# Patient Record
Sex: Female | Born: 1945 | Race: White | Hispanic: No | Marital: Married | Smoking: Former smoker
Health system: Southern US, Community
[De-identification: ages and names within clinical notes are randomized; demographics above are authoritative.]

## PROBLEM LIST (undated history)

## (undated) DIAGNOSIS — K5010 Crohn's disease of large intestine without complications: Secondary | ICD-10-CM

## (undated) DIAGNOSIS — K449 Diaphragmatic hernia without obstruction or gangrene: Secondary | ICD-10-CM

## (undated) DIAGNOSIS — K5792 Diverticulitis of intestine, part unspecified, without perforation or abscess without bleeding: Secondary | ICD-10-CM

## (undated) DIAGNOSIS — J449 Chronic obstructive pulmonary disease, unspecified: Secondary | ICD-10-CM

## (undated) DIAGNOSIS — E119 Type 2 diabetes mellitus without complications: Secondary | ICD-10-CM

## (undated) DIAGNOSIS — I10 Essential (primary) hypertension: Secondary | ICD-10-CM

## (undated) DIAGNOSIS — K589 Irritable bowel syndrome without diarrhea: Secondary | ICD-10-CM

## (undated) DIAGNOSIS — K5790 Diverticulosis of intestine, part unspecified, without perforation or abscess without bleeding: Secondary | ICD-10-CM

## (undated) DIAGNOSIS — F419 Anxiety disorder, unspecified: Secondary | ICD-10-CM

## (undated) DIAGNOSIS — D126 Benign neoplasm of colon, unspecified: Principal | ICD-10-CM

## (undated) DIAGNOSIS — K219 Gastro-esophageal reflux disease without esophagitis: Secondary | ICD-10-CM

## (undated) DIAGNOSIS — J4489 Other specified chronic obstructive pulmonary disease: Secondary | ICD-10-CM

## (undated) DIAGNOSIS — J189 Pneumonia, unspecified organism: Secondary | ICD-10-CM

## (undated) HISTORY — DX: Type 2 diabetes mellitus without complications: E11.9

## (undated) HISTORY — DX: Gastro-esophageal reflux disease without esophagitis: K21.9

## (undated) HISTORY — DX: Diverticulitis of intestine, part unspecified, without perforation or abscess without bleeding: K57.92

## (undated) HISTORY — DX: Diverticulosis of intestine, part unspecified, without perforation or abscess without bleeding: K57.90

## (undated) HISTORY — DX: Crohn's disease of large intestine without complications: K50.10

## (undated) HISTORY — DX: Pneumonia, unspecified organism: J18.9

## (undated) HISTORY — DX: Irritable bowel syndrome, unspecified: K58.9

## (undated) HISTORY — PX: OTHER SURGICAL HISTORY: SHX169

## (undated) HISTORY — DX: Chronic obstructive pulmonary disease, unspecified: J44.9

## (undated) HISTORY — DX: Anxiety disorder, unspecified: F41.9

## (undated) HISTORY — DX: Diaphragmatic hernia without obstruction or gangrene: K44.9

## (undated) HISTORY — PX: CHOLECYSTECTOMY: SHX55

## (undated) HISTORY — DX: Benign neoplasm of colon, unspecified: D12.6

## (undated) HISTORY — DX: Essential (primary) hypertension: I10

## (undated) HISTORY — DX: Other specified chronic obstructive pulmonary disease: J44.89

---

## 1978-02-04 HISTORY — PX: ABDOMINAL HYSTERECTOMY: SHX81

## 1996-02-05 DIAGNOSIS — D126 Benign neoplasm of colon, unspecified: Secondary | ICD-10-CM

## 1996-02-05 HISTORY — DX: Benign neoplasm of colon, unspecified: D12.6

## 1996-07-05 DIAGNOSIS — K5010 Crohn's disease of large intestine without complications: Secondary | ICD-10-CM

## 1996-07-05 DIAGNOSIS — D126 Benign neoplasm of colon, unspecified: Secondary | ICD-10-CM

## 1996-07-05 HISTORY — DX: Benign neoplasm of colon, unspecified: D12.6

## 1996-07-05 HISTORY — DX: Crohn's disease of large intestine without complications: K50.10

## 1997-05-05 ENCOUNTER — Ambulatory Visit (HOSPITAL_COMMUNITY): Admission: RE | Admit: 1997-05-05 | Discharge: 1997-05-05 | Payer: Self-pay | Admitting: *Deleted

## 1997-10-05 ENCOUNTER — Ambulatory Visit (HOSPITAL_COMMUNITY): Admission: RE | Admit: 1997-10-05 | Discharge: 1997-10-05 | Payer: Self-pay | Admitting: Neurosurgery

## 1999-02-05 DIAGNOSIS — K5792 Diverticulitis of intestine, part unspecified, without perforation or abscess without bleeding: Secondary | ICD-10-CM

## 1999-02-05 HISTORY — DX: Diverticulitis of intestine, part unspecified, without perforation or abscess without bleeding: K57.92

## 1999-05-06 HISTORY — PX: COLONOSCOPY: SHX174

## 1999-05-31 ENCOUNTER — Encounter (INDEPENDENT_AMBULATORY_CARE_PROVIDER_SITE_OTHER): Payer: Self-pay | Admitting: Specialist

## 1999-05-31 ENCOUNTER — Other Ambulatory Visit: Admission: RE | Admit: 1999-05-31 | Discharge: 1999-05-31 | Payer: Self-pay | Admitting: Gastroenterology

## 1999-06-18 ENCOUNTER — Encounter: Payer: Self-pay | Admitting: General Surgery

## 1999-06-18 ENCOUNTER — Ambulatory Visit (HOSPITAL_COMMUNITY): Admission: RE | Admit: 1999-06-18 | Discharge: 1999-06-18 | Payer: Self-pay | Admitting: General Surgery

## 1999-07-06 HISTORY — PX: COLECTOMY: SHX59

## 1999-07-11 ENCOUNTER — Encounter: Payer: Self-pay | Admitting: General Surgery

## 1999-07-16 ENCOUNTER — Encounter (INDEPENDENT_AMBULATORY_CARE_PROVIDER_SITE_OTHER): Payer: Self-pay | Admitting: Specialist

## 1999-07-16 ENCOUNTER — Inpatient Hospital Stay (HOSPITAL_COMMUNITY): Admission: RE | Admit: 1999-07-16 | Discharge: 1999-07-22 | Payer: Self-pay | Admitting: General Surgery

## 1999-09-05 HISTORY — PX: ESOPHAGOGASTRODUODENOSCOPY: SHX1529

## 1999-09-18 ENCOUNTER — Ambulatory Visit (HOSPITAL_COMMUNITY): Admission: RE | Admit: 1999-09-18 | Discharge: 1999-09-18 | Payer: Self-pay | Admitting: Gastroenterology

## 1999-09-18 ENCOUNTER — Encounter: Payer: Self-pay | Admitting: Gastroenterology

## 1999-09-20 ENCOUNTER — Other Ambulatory Visit: Admission: RE | Admit: 1999-09-20 | Discharge: 1999-09-20 | Payer: Self-pay | Admitting: Gastroenterology

## 1999-11-12 ENCOUNTER — Ambulatory Visit (HOSPITAL_COMMUNITY): Admission: RE | Admit: 1999-11-12 | Discharge: 1999-11-12 | Payer: Self-pay | Admitting: General Surgery

## 2000-03-04 ENCOUNTER — Other Ambulatory Visit: Admission: RE | Admit: 2000-03-04 | Discharge: 2000-03-04 | Payer: Self-pay | Admitting: Obstetrics and Gynecology

## 2000-11-06 ENCOUNTER — Ambulatory Visit (HOSPITAL_COMMUNITY): Admission: RE | Admit: 2000-11-06 | Discharge: 2000-11-06 | Payer: Self-pay | Admitting: Family Medicine

## 2000-11-06 ENCOUNTER — Encounter: Payer: Self-pay | Admitting: Family Medicine

## 2001-07-16 ENCOUNTER — Encounter: Payer: Self-pay | Admitting: Family Medicine

## 2001-07-16 ENCOUNTER — Ambulatory Visit (HOSPITAL_COMMUNITY): Admission: RE | Admit: 2001-07-16 | Discharge: 2001-07-16 | Payer: Self-pay | Admitting: Family Medicine

## 2001-08-26 ENCOUNTER — Ambulatory Visit (HOSPITAL_COMMUNITY): Admission: RE | Admit: 2001-08-26 | Discharge: 2001-08-26 | Payer: Self-pay | Admitting: Family Medicine

## 2001-08-26 ENCOUNTER — Encounter: Payer: Self-pay | Admitting: Family Medicine

## 2001-09-03 ENCOUNTER — Ambulatory Visit (HOSPITAL_COMMUNITY): Admission: RE | Admit: 2001-09-03 | Discharge: 2001-09-03 | Payer: Self-pay | Admitting: Family Medicine

## 2001-09-03 ENCOUNTER — Encounter: Payer: Self-pay | Admitting: Family Medicine

## 2002-03-01 ENCOUNTER — Encounter: Payer: Self-pay | Admitting: Emergency Medicine

## 2002-03-01 ENCOUNTER — Emergency Department (HOSPITAL_COMMUNITY): Admission: EM | Admit: 2002-03-01 | Discharge: 2002-03-01 | Payer: Self-pay | Admitting: Emergency Medicine

## 2003-04-01 ENCOUNTER — Ambulatory Visit (HOSPITAL_COMMUNITY): Admission: RE | Admit: 2003-04-01 | Discharge: 2003-04-01 | Payer: Self-pay | Admitting: Internal Medicine

## 2003-05-03 ENCOUNTER — Ambulatory Visit (HOSPITAL_COMMUNITY): Admission: RE | Admit: 2003-05-03 | Discharge: 2003-05-03 | Payer: Self-pay | Admitting: Internal Medicine

## 2004-11-04 ENCOUNTER — Inpatient Hospital Stay (HOSPITAL_COMMUNITY): Admission: EM | Admit: 2004-11-04 | Discharge: 2004-11-17 | Payer: Self-pay | Admitting: Emergency Medicine

## 2004-11-05 ENCOUNTER — Ambulatory Visit: Payer: Self-pay | Admitting: Internal Medicine

## 2004-11-06 HISTORY — PX: ENDOSCOPIC RETROGRADE CHOLANGIOPANCREATOGRAPHY (ERCP) WITH PROPOFOL: SHX5810

## 2004-11-18 ENCOUNTER — Encounter (HOSPITAL_COMMUNITY): Admission: RE | Admit: 2004-11-18 | Discharge: 2004-12-18 | Payer: Self-pay | Admitting: Internal Medicine

## 2004-11-18 ENCOUNTER — Encounter: Admission: RE | Admit: 2004-11-18 | Discharge: 2004-11-18 | Payer: Self-pay | Admitting: Internal Medicine

## 2004-11-19 ENCOUNTER — Ambulatory Visit (HOSPITAL_COMMUNITY): Payer: Self-pay | Admitting: Internal Medicine

## 2004-11-22 ENCOUNTER — Ambulatory Visit (HOSPITAL_COMMUNITY): Admission: RE | Admit: 2004-11-22 | Discharge: 2004-11-22 | Payer: Self-pay | Admitting: Unknown Physician Specialty

## 2004-11-30 ENCOUNTER — Encounter (INDEPENDENT_AMBULATORY_CARE_PROVIDER_SITE_OTHER): Payer: Self-pay | Admitting: General Surgery

## 2004-12-01 ENCOUNTER — Inpatient Hospital Stay (HOSPITAL_COMMUNITY): Admission: RE | Admit: 2004-12-01 | Discharge: 2004-12-03 | Payer: Self-pay | Admitting: General Surgery

## 2005-06-05 ENCOUNTER — Encounter: Payer: Self-pay | Admitting: Neurosurgery

## 2007-09-21 ENCOUNTER — Emergency Department (HOSPITAL_COMMUNITY): Admission: EM | Admit: 2007-09-21 | Discharge: 2007-09-21 | Payer: Self-pay | Admitting: Emergency Medicine

## 2007-11-03 ENCOUNTER — Ambulatory Visit (HOSPITAL_COMMUNITY): Admission: RE | Admit: 2007-11-03 | Discharge: 2007-11-03 | Payer: Self-pay | Admitting: Neurology

## 2008-02-17 ENCOUNTER — Ambulatory Visit: Payer: Self-pay | Admitting: Cardiology

## 2008-03-18 ENCOUNTER — Emergency Department (HOSPITAL_COMMUNITY): Admission: EM | Admit: 2008-03-18 | Discharge: 2008-03-18 | Payer: Self-pay | Admitting: Emergency Medicine

## 2010-05-22 LAB — BASIC METABOLIC PANEL
BUN: 19 mg/dL (ref 6–23)
CO2: 27 mEq/L (ref 19–32)
Calcium: 8 mg/dL — ABNORMAL LOW (ref 8.4–10.5)
Chloride: 105 mEq/L (ref 96–112)
Creatinine, Ser: 1.52 mg/dL — ABNORMAL HIGH (ref 0.4–1.2)
GFR calc Af Amer: 42 mL/min — ABNORMAL LOW (ref >60)
GFR calc non Af Amer: 35 mL/min — ABNORMAL LOW (ref >60)
Glucose, Bld: 162 mg/dL — ABNORMAL HIGH (ref 70–99)
Potassium: 4.1 mEq/L (ref 3.5–5.1)
Sodium: 137 mEq/L (ref 135–145)

## 2010-05-22 LAB — CBC
HCT: 46.1 % — ABNORMAL HIGH (ref 36.0–46.0)
Hemoglobin: 15.6 g/dL — ABNORMAL HIGH (ref 12.0–15.0)
MCHC: 33.8 g/dL (ref 30.0–36.0)
Platelets: 402 10*3/uL — ABNORMAL HIGH (ref 150–400)
RDW: 14.1 % (ref 11.5–15.5)

## 2010-05-22 LAB — RAPID URINE DRUG SCREEN, HOSP PERFORMED
Amphetamines: NOT DETECTED
Barbiturates: NOT DETECTED
Benzodiazepines: POSITIVE — AB
Cocaine: NOT DETECTED
Opiates: NOT DETECTED
Tetrahydrocannabinol: NOT DETECTED

## 2010-05-22 LAB — BLOOD GAS, ARTERIAL
Acid-base deficit: 0.5 mmol/L (ref 0.0–2.0)
O2 Content: 3 L/min
O2 Saturation: 93 %
Patient temperature: 37
TCO2: 23.4 mmol/L (ref 0–100)

## 2010-05-22 LAB — ETHANOL: Alcohol, Ethyl (B): <5 mg/dL (ref 0–10)

## 2010-05-22 LAB — THEOPHYLLINE LEVEL: Theophylline Lvl: 7.1 ug/mL — ABNORMAL LOW (ref 10.0–20.0)

## 2010-05-22 LAB — DIFFERENTIAL
Basophils Absolute: 0 10*3/uL (ref 0.0–0.1)
Basophils Relative: 0 % (ref 0–1)
Eosinophils Relative: 1 % (ref 0–5)
Monocytes Absolute: 0.4 10*3/uL (ref 0.1–1.0)

## 2010-06-22 NOTE — Consult Note (Signed)
[name], [name]               ACCOUNT NO.:  [id number]   MEDICAL RECORD NO.:  [id number]          PATIENT TYPE:  INP   LOCATION:  A341                          FACILITY:  [facility]   PHYSICIAN:  Edward L. Juanetta Gosling, M.D.DATE OF BIRTH:  10/10/1945   DATE OF CONSULTATION:  11/08/2004  DATE OF DISCHARGE:                                   CONSULTATION   REASON FOR CONSULTATION:  Abnormal chest x-ray.   HISTORY OF PRESENT ILLNESS:  [name] is a 65 year old who came to the  emergency room on the day of admission with severe abdominal pain.  She said  that it is sharp, started in her epigastric, went into the right upper  quadrant and into her back.  She ended up having an ERCP with sphincterotomy  and stone extraction on November 06, 2004.  She underwent a CT chest because  of shortness of breath and was found to have changes that are suggestive  that she has had bronchiectasis, probably inflammatory in nature.  She says  she is very short of breath.  She normally is better if she is able to take  nebulizer treatments, but this time does not seem to be helping a great  deal.  Her CT scan did not show acute pulmonary embolus.  Did show extensive  scarring with bronchiectasis, volume loss in both upper lobes and right  middle lobes with relative sparing of the lower lobe.  She had mediastinal  hilar adenopathy also and bilateral pleural effusions, right more than left.  She says that she has had trouble with her lungs for some time.  She has had  a history of pneumonia in the past.  She denies significant shortness of  breath, cough, fever, chills, sputum production or weight loss prior to  admission.  She did loose about 14 pounds, but did that on purpose.  She was  sent by Dr. Eliberto Ivory for ultrasound and she also had a chest x-ray done in the  evening in the last several months.   PAST MEDICAL HISTORY:  1.  COPD.  2.  Hypertension.  3.  Anxiety.  4.  Depression.  5.  Peripheral vascular  disease.   PAST SURGICAL HISTORY:  1.  Colon resection for diverticulitis.  2.  Hemorrhoidectomy.  3.  C-section.  4.  Fusion surgery on her back for chronic back pain.   MEDICATIONS ON ADMISSION:  1.  Prozac 60 mg daily.  2.  Zyprexa 10 mg daily.  3.  Valium 5 mg b.i.d.  4.  Percocet 1-2 q.6h. p.r.n.  5.  Fluid pill, but she is not sure what it is.  6.  Advair 250/50 one puff b.i.d.  7.  Albuterol inhaler q.i.d.   FAMILY HISTORY:  Father died of lung cancer in his 23's.  He did have COPD.  Mother had COPD.  Sister had tuberculosis at age 25, and while she was  living at home, the patient was 4.  She is not certain if she has ever had a  TB skin test in the past.   SOCIAL HISTORY:  She  is unemployed.  She smokes about a package of  cigarettes daily.  Her total pack a year smoking history is approximately  100.   PHYSICAL EXAMINATION:  GENERAL APPEARANCE:  She looks dyspneic.  VITAL SIGNS:  Temperature 98.4, pulse 94, respirations 22, blood pressure  140/82, O2 saturation 98% on 50%.  HEENT:  Pupils are reactive.  Nose and throat are clear.  NECK:  Supple without masses.  CHEST:  Wheezes bilaterally.  HEART:  Irregular without gallops.  ABDOMEN:  Soft.  EXTREMITIES:  No edema.  CNS:  Grossly intact.   LABORATORY DATA:  Blood gas on 2 liters of O2 showed PO2 48, PCO2 30.9, pH  7.46.  Comprehensive metabolic panel:  SGPT 44, alk-phos 228, bilirubin 2.5,  albumin 2.  CBC:  White count 10,800, hemoglobin 10.   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  I think we need to go ahead  and obtain sputum.  Go ahead and put a skin test on her, treat her for her  COPD.  She had a chest x-ray done over at [facility] about three weeks ago, so  I think we should go ahead and ask for that film to be sent over here, and I  will need  to get the report.  I think that her pleural effusion is probably  related to her intraabdominal process.  We can go ahead and obtain some  pleural fluid for culture as well, and I think that is reasonable.  I would  probably go ahead and start TB treatment, although, I do not think this  necessarily represents active TB, but she is fairly sick, and I do not want  to take a chance with not treating her.      Edward L. Juanetta Gosling, M.D.  Electronically Signed     ELH/MEDQ  D:  11/08/2004  T:  11/09/2004  Job:  [id number]

## 2010-06-22 NOTE — Consult Note (Signed)
[name], [name]               ACCOUNT NO.:  [id number]   MEDICAL RECORD NO.:  [id number]          PATIENT TYPE:  INP   LOCATION:  A212                          FACILITY:  [facility]   PHYSICIAN:  R. Roetta Sessions, M.D. DATE OF BIRTH:  03/20/1945   DATE OF CONSULTATION:  11/05/2004  DATE OF DISCHARGE:                                   CONSULTATION   REFERRING PHYSICIAN:  Margaretmary Dys, M.D.   REASON FOR CONSULTATION:  Suspect cholecystitis and acute gallstone  pancreatitis.   HISTORY OF PRESENT ILLNESS:  Ms. Marcelli is a 65 year old, Caucasian female  who reports that yesterday afternoon she began to have severe sudden  unrelenting and unbearable epigastric and right upper quadrant pain that  radiated around to her flank to her back.  After about 2 hours of severe  pain, she presents to the emergency room.  At that time, her pain was 10/10.  She had nausea, but did not have emesis until after consuming contrast for  CT scan.  She complains of cold sweats and she believes she had fever and  chills while at home.  Temperature today is normal.  She was found to have  an elevated amylase at 1117, elevated lipase at 920.  Today, this is up from  519 and 1000 yesterday.  She has elevated total bilirubin at 6.1, direct  bilirubin 4.2.  She has an elevated Alk phos of 320, elevated AST of 240,  elevated ALT of 167.  She denies any history of jaundice.  She does report  some darkening and orange discoloration to her urine.  She has been admitted  for pain control and she is being given Dilaudid with good relief.  She has  also been started on Unasyn 3 g IV q.6h.  She has had no further emesis.   PAST MEDICAL HISTORY:  1.  IBS with alternating constipation and diarrhea.  She is followed by      [facility] in [place].  2.  History of diverticulitis, status post colonic resection for      diverticular disease in October 2001.  3.  Status post hemorrhoidectomy.  4.  Status post  appendectomy.  5.  History of chronic GERD and hiatal hernia.  She has had a recent EGD,      although she does not recall exactly when by [facility].  6.  Chronic back pain, status post fusion.  7.  COPD.  8.  Hypertension.  9.  Anxiety.  10. Depression.  11. Peripheral vascular disease.  12. C-section.  13. Partial hysterectomy.  14. Appendectomy.   MEDICATIONS PRIOR TO ADMISSION:  1.  Prozac 60 mg daily.  2.  Zyprexa daily.  3.  Valium 5 mg b.i.d.  4.  Percocet one to two q.6h. p.r.n. chronic back pain.  5.  Unknown fluid pill.  6.  Advair 250/50 mg b.i.d.  7.  Albuterol nebulizers p.r.n.  8.  Nexium 40 mg daily.  9.  Zantac p.r.n.  10. Maalox p.r.n.  11. Carafate p.r.n.   ALLERGIES:  CODEINE and DEMEROL.   FAMILY HISTORY:  Ms. Killough  mother is deceased at age 59 secondary to  abdominal aortic aneurysm.  Father deceased at age 42 secondary to lung  carcinoma and COPD.  She has multiple siblings with history significant for  coronary artery disease.   SOCIAL HISTORY:  Ms. Gathright has been married x4 years.  She is currently  unemployed.  She has smoked x45 years, currently one pack a day, previously  two packs a day.  She denies any alcohol or drug use.  She has two grown,  healthy daughters.   REVIEW OF SYSTEMS:  CONSTITUTIONAL:  She denies any anorexia.  She is  complaining of fatigue.  See HPI.  CARDIOVASCULAR:  Denies any chest pain or  palpitations or dyspnea.  RESPIRATORY:  She does have chronic cough which is  nonproductive.  GASTROINTESTINAL:  See HPI.  She has chronic daily or every  other day heartburn and indigestion.  She has had history of barium pill  esophagram recently for dysphagia.  She takes Nexium, Zantac, Maalox and  Carafate on a regular p.r.n. basis.  She is followed by [facility] in  [place].   PHYSICAL EXAMINATION:  VITAL SIGNS:  Temperature 97.9, pulse 83,  respirations 18, blood pressure 90/53.  She is on 2 L O2 via nasal cannula.  Height  60 inches, weight 128.6 pounds.  GENERAL:  Ms. Slaven is a 65 year old, Caucasian female who is alert,  pleasant and cooperative in no acute distress.  HEENT:  Sclerae with mild icterus.  Conjunctivae pale.  Oropharynx pink and  moist without any lesions.  Her right eye deviates laterally.  NECK:  Supple without any masses or thyromegaly.  HEART:  Regular rate and rhythm without normal S1, S2 without murmurs, rubs  or gallops.  LUNGS:  Emphysematous changes throughout.  In no acute distress.  ABDOMEN:  Positive bowel sounds x4.  No bruits auscultated.  She has  positive Murphy's sign.  She has severe right upper quadrant tenderness on  superficial palpation.  She does have rebound tenderness and guarding as  well.  Unable to palpate mass or hepatosplenomegaly.  EXTREMITIES:  Without edema or clubbing bilaterally.  SKIN:  Pink, warm and dry without any rash or jaundice.   LABORATORY DATA AND X-RAY FINDINGS:  WBC 15.3, hemoglobin 12.7, hematocrit  36.1, platelets 443.  PT 59, INR 1.2, calcium 8.  Sodium 129, potassium 4.4,  chloride 97, CO2 26, BUN 9, creatinine 1, glucose 107.  Total protein 5.1,  albumin 2.9.   IMPRESSION:  Ms. Mittal is a 65 year old, Caucasian female with acute onset  of right upper quadrant and epigastric pain rated 10/10 with sudden onset  yesterday afternoon.  The pain lasted about 2 hours prior to emergency room  visit with subsequent admission.  She also had nausea without emesis until  she was given computed tomography contrast to drink.  In the emergency room,  she has had emesis x1.  She was found to have an elevated amylase at 519 and  lipase at 1000 when she was admitted.  This is now up to 1117 and 920.  She  has hyperbilirubinemia, most direct at 6.1/4.2.  She has an elevated  alkaline phosphatase at 320, AST 240 and ALT 167.  Her computed tomography of abdomen and pelvis was reviewed with the radiologist today.  She does  show gallbladder wall thickening  and enhanced common bile duct with likely  cholecystitis, possible cholangitis.  She has possible mild pancreatitis and  poor visualization of common bile duct, but  radiologist reports to me a  measurement of approximately 9 mm duct.  There is no evidence of  cholelithiasis on computed tomography per the radiologist report.   RECOMMENDATIONS:  1.  We have a 65 year old Caucasian female with findings consistent with      partial biliary obstruction of questionable etiology with possible      biliary pancreatitis, occult gallstone pancreatitis or even occult      pancreatic mass.  We need to rule out cholelithiasis or      choledocholithiasis.  2.  Stat gallbladder ultrasound.  3.  Agree with antibiotic coverage.  4.  Agree with Dilaudid p.r.n. pain.  5.  Agree with surgical consult.  6.  Further recommendations to follow.  This case has been discussed with      Dr. Jena Gauss and our plan of care outlined above.   We would like to thank Dr. Sherle Poe for allowing [place] to participate in the  care of Ms. Sharma Covert.      Nicholas Lose, N.P.      Jonathon Bellows, M.D.  Electronically Signed    KC/MEDQ  D:  11/05/2004  T:  11/05/2004  Job:  [id number]

## 2010-06-22 NOTE — Group Therapy Note (Signed)
[name], [name]               ACCOUNT NO.:  [id number]   MEDICAL RECORD NO.:  [id number]          PATIENT TYPE:  INP   LOCATION:  A302                          FACILITY:  [facility]   PHYSICIAN:  Edward L. Juanetta Gosling, M.D.DATE OF BIRTH:  May 08, 1945   DATE OF PROCEDURE:  DATE OF DISCHARGE:                                   PROGRESS NOTE   SUBJECTIVE:  [name] seems better. She is off of isolation now. She says  she is less short of breath.   Her exam shows temp is 98.7, pulse 89, respirations 20, blood pressure  120/65. O2 sat is 97%. Chest is clearer.   ASSESSMENT:  She seems to be improving. She is going to have problems I  suspect with bronchiectasis from now on, but she is better, and we will plan  to continue treatments and follow. I am going to follow her more  peripherally now but will be of course be available as needed to follow her  more actively.      Edward L. Juanetta Gosling, M.D.  Electronically Signed     ELH/MEDQ  D:  11/15/2004  T:  11/15/2004  Job:  [id number]

## 2010-06-22 NOTE — Group Therapy Note (Signed)
[name], [name]               ACCOUNT NO.:  [id number]   MEDICAL RECORD NO.:  [id number]          PATIENT TYPE:  INP   LOCATION:  A341                          FACILITY:  [facility]   PHYSICIAN:  Edward L. Juanetta Gosling, M.D.DATE OF BIRTH:  30-Nov-1945   DATE OF PROCEDURE:  11/09/2004  DATE OF DISCHARGE:                                   PROGRESS NOTE   PROBLEM:  1.  Abnormal chest x-ray.  2.  Cholecystitis.   SUBJECTIVE:  [name] is about the same. She is a little less short of  breath than she was. We are going to see if we can get her chest x-ray from  [facility] to compare.   OBJECTIVE:  VITAL SIGNS:  Her exam shows temperature 99.6, pulse 107,  respirations 22, blood pressure 158/68, O2 saturation 95% on 50%.   ASSESSMENT:  The concern is that we cannot rule out her having an active  process. I think the areas are mostly scarring from a previous infection  versus inflammatory process, but apparently she had a chest x-ray done about  six weeks ago that did not show much of anything. We are going to see if we  can obtain that film, and that will help make a decision. I did start her on  TB medications yesterday. We are going to try to get TB smears done. She had  a PPD placed yesterday as well.      Edward L. Juanetta Gosling, M.D.  Electronically Signed     ELH/MEDQ  D:  11/09/2004  T:  11/09/2004  Job:  [id number]

## 2010-06-22 NOTE — Group Therapy Note (Signed)
NAMEPOLLIE, POMA               ACCOUNT NO.:  [id number]   MEDICAL RECORD NO.:  [id number]          PATIENT TYPE:  INP   LOCATION:  A341                          FACILITY:  [facility]   PHYSICIAN:  Edward L. Juanetta Gosling, M.D.DATE OF BIRTH:  [birth year]   DATE OF PROCEDURE:  11/13/2004  DATE OF DISCHARGE:                                   PROGRESS NOTE   SUBJECTIVE:  Ms. Thoreson seems to be doing better.  She did have multiple  negative AFB smears, so I have discontinued isolation yesterday.  It is my  opinion then that she has the abnormalities on her chest x-ray on the basis  of a severe pneumonia.  I am not sure if that was started presently, but I  suspect it did.  She has what looks like bronchiectatic changes.  I suspect  that she had another pneumonia intercurrently that caused her to have some  lung destruction and bronchiectatic changes.  I would plan to continue her  antibiotics as before.   OBJECTIVE:  VITAL SIGNS:  Her exam today shows temperature is 98.1, pulse  91, respirations 24, blood pressure 122/65.   ASSESSMENT/PLAN:  When she has improved as far as her lung situation is  concerned,  I think she could go ahead and have her cholecystectomy.      Edward L. Juanetta Gosling, M.D.  Electronically Signed     ELH/MEDQ  D:  11/13/2004  T:  11/13/2004  Job:  [id number]

## 2010-06-22 NOTE — H&P (Signed)
[name], [name]               ACCOUNT NO.:  [id number]   MEDICAL RECORD NO.:  [id number]          PATIENT TYPE:  INP   LOCATION:  A212                          FACILITY:  [facility]   PHYSICIAN:  Margaretmary Dys, M.D.DATE OF BIRTH:  1945-05-17   DATE OF ADMISSION:  11/04/2004  DATE OF DISCHARGE:  LH                                HISTORY & PHYSICAL   Audio too short to transcribe (less than 5 seconds)      Margaretmary Dys, M.D.     AM/MEDQ  D:  11/04/2004  T:  11/04/2004  Job:  [id number]

## 2010-06-22 NOTE — Op Note (Signed)
NAMEKELIAH, HARNED               ACCOUNT NO.:  [id number]   MEDICAL RECORD NO.:  [id number]          PATIENT TYPE:  OBV   LOCATION:  A340                          FACILITY:  [facility]   PHYSICIAN:  Dirk Dress. [name], M.D.   DATE OF BIRTH:  1946/01/26   DATE OF PROCEDURE:  DATE OF DISCHARGE:                                 OPERATIVE REPORT   PREOPERATIVE DIAGNOSES:  1.  Cholelithiasis.  2.  Cholecystitis.   POSTOPERATIVE DIAGNOSES:  1.  Cholelithiasis.  2.  Cholecystitis.   PROCEDURE:  Laparoscopic cholecystectomy.   SURGEON:  Dirk Dress. [name], M.D.   DESCRIPTION OF PROCEDURE:  Under general anesthesia, the patient's abdomen  was prepped and draped as a sterile field.  A supraumbilical midline  incision was made and Veress needle was inserted uneventfully.  The abdomen  was insufflated with 3 L of CO2.  Using a Visiport guide, the 10 mm port was  placed.  The laparoscope was placed.  The gallbladder was visualized.  She  had chronic, inflamed, thickened gallbladder.  Under videoscopic guidance, a  10 mm and two 5 mm ports were placed in the right subcostal region.  The  gallbladder was grasped and positioned.  The cystic artery was dissected and  clipped with three clips and divided.  The cystic duct was dissected,  clipped with five clips, and divided.  The gallbladder was then separated  from the intrahepatic bed with electrocautery.  It was placed in an  EndoCatch device and retrieved.  Hemostasis was achieved.  Irrigation was  carried out and the fluid was clear.  CO2 was allowed to escape from the  abdomen and the ports were removed.  The incisions were closed using 0  Vicryl along the fascia and the umbilicus and staples for the skin.  The  patient tolerated the procedure well.  She was awakened from anesthesia  uneventfully and transferred to her bed, taken to post-anesthetic care unit  for monitoring.      Dirk Dress. [name], M.D.  Electronically Signed     LCS/MEDQ  D:   11/30/2004  T:  11/30/2004  Job:  [id number]   cc:   Dr. Weyman Pedro, [facility]

## 2010-06-22 NOTE — Discharge Summary (Signed)
[facility]  Patient:    [name], [name]                      MRN: [id number] Adm. Date:  [id number] Disc. Date: [id number] Attending:  Carson Myrtle CC:         Ulyess Mort, M.D. [facility]                           Discharge Summary  FINAL DIAGNOSIS:  Chronic diverticulitis.  HISTORY AND HOSPITAL COURSE:  The patient was seen, evaluated as an outpatient upon referral from Dr. Victorino Dike.  Workup had been accomplished, and she was prepared for sigmoid colectomy.  She was prepared at home and admitted to the hospital on July 16, 1999, underwent a sigmoid colectomy at that time.  She had a satisfactory postoperative recovery, requiring manipulation of pain medication.  That was done.  Anxiety, depression, chronic was treated.  Her GI function made a smooth and rapid return.  She tolerated her diet on the third and fourth day, and was ready for discharge on July 22, 1999.  She was sent home on a limited diet, limited activity, Vicodin for pain, to resume normal medications and will be followed in the office.  LABORATORY DATA ON ADMISSION:  Normal with a normal CBC, normal chemistries, negative urine.  Cardiogram showing left atrial enlargement and low voltage.  Chest x-ray normal. DD:  08/12/99 TD:  08/12/99 Job: [id number]

## 2010-06-22 NOTE — Op Note (Signed)
[facility]  Patient:    [name], [name]                      MRN: [id number] Proc. Date: 07/16/99 Adm. Date:  [id number] Attending:  Carson Myrtle                           Operative Report  PREOPERATIVE DIAGNOSIS:  Chronic diverticulosis.  POSTOPERATIVE DIAGNOSIS:  Chronic diverticulosis.  OPERATION PERFORMED:  Sigmoid colectomy.  SURGEON:  Timothy E. Earlene Plater, M.D.  ASSISTANT:  Zigmund Daniel, M.D.  ANESTHESIA:  CRNA supervised [place].  INDICATIONS FOR PROCEDURE:  Ms. Vogelgesang is a 65 year old Caucasian female with greater than 20 year history of irritable bowel syndrome, difficult bowel movements, alternating constipation, mostly diarrhea, frequent abdominal distention, cramps and diarrhea.  Colonoscopy has been done with removal of polyps.  Colonoscopy has been difficult with tortuosity and fixation of the sigmoid colon.  Barium enema demonstrated same.  Patient is known to have diverticulosis, has never been hospitalized with diverticulosis though one admission in 1998 was for so-called ischemic colitis.  She has been carefully counseled and is anxious to proceed with the sigmoid colectomy.  Prep was accomplished at home.  DESCRIPTION OF PROCEDURE:  The patient was brought to the operating room and placed supine.  General endotracheal anesthesia administered.  Preoperative antibiotics had been given.  A nasogastric tube, Foley catheter, PAS hose applied.  The patient was positioned in stirrups for potential EEA anastomosis and then placed in the normal supine position.  The abdomen was scrubbed prepped and draped in the usual fashion.  A lower midline incision used to the right of the umbilicus.  The abdomen was entered.  Some adhesions of omentum to the anterior abdominal wall were taken down with cautery.  Upper abdominal examination revealed a prominent pylorus, no pathology.  Liver and gallbladder were palpably normal.  No  abnormalities noted.  The small bowel was not adherent to the pelvis.  The omentum was and it was taken down sharply and freed.  The small bowel and omentum were packed into the upper abdomen.  The sigmiod colon was redundant, fixed to the left pelvic side wall, looped and duplicated upon itself and densely adherent to the ovary, to the peritoneum, to the bladder on the left side.  These adhesions were taken down carefully sharply so that the sigmoid colon was freed and even then, it was redoubled upon itself and adherent and fixed to itself at the apex of the loop of the sigmoid.  Sites were chosen for resection at the upper rectum and proximal sigmoid.  The patient had plenty of colon for an easy anastomosis handsewn one layer over the brim of the pelvis without tension.  The mesentery had been divided between clamps and carefully tied.  Both ureters were identified. Both left and right and kept lateral to the dissection and surgery.  Both ovaries were atrophic and were left in their respective positions.  The uterus was absent.  The bladder seemed to be intact.  There was a foreign body reaction of approximately 1 x 2 cm in the cuff of the vagina.  I did not attempt to remove that.  The redundant sigmoid was removed between clamps and then a hand-sewn one layer anastomosis was created.  This was air and water tight and again lay nicely over the pelvic brim without tension.  The mesentery was closed loosely on  the right side of the peritoneum.  Bleeding had been controlled.  The pelvis was copiously irrigated.  Counts were correct.  The other viscera were returned to their normal position and the abdomen was closed with a single layer of #1 PDS suture.  Second counts were correct.  Subcu copiously irrigated.  Skin closed with wide skin staples.  The patient tolerated the procedure well and was stable at completion of the procedure.  Estimated blood loss for this procedure was minimal.  She  was removed to recovery room in good condition. DD:  07/16/99 TD:  07/18/99 Job: [id number]

## 2010-06-22 NOTE — Group Therapy Note (Signed)
[name], [name]               ACCOUNT NO.:  [id number]   MEDICAL RECORD NO.:  [id number]          PATIENT TYPE:  INP   LOCATION:  A341                          FACILITY:  [facility]   PHYSICIAN:  Edward L. Juanetta Gosling, M.D.DATE OF BIRTH:  10/27/1945   DATE OF PROCEDURE:  11/10/2004  DATE OF DISCHARGE:                                   PROGRESS NOTE   Patient of Dr. Orvan Falconer.   SUBJECTIVE:  [name] is in essence unchanged. We are trying to get  sputums to make sure about whether she has TB or not. She does have an  abnormal CT which is new. Her exam shows temperature of 97.8, pulse 85,  respirations 19, blood pressure 142/74, O2 sats 97%. She continues to have  some pain. Her chest is clearer.   ASSESSMENT:  She had an abnormal CT scan and clearly is at risk for TB, so  she is going to need to continue treatment. She did have a thoracentesis and  thus far culture that is no organisms seen, no white cells seen. I do not  see any of the AFB smears back as yet.      Edward L. Juanetta Gosling, M.D.  Electronically Signed     ELH/MEDQ  D:  11/10/2004  T:  11/10/2004  Job:  [id number]

## 2010-06-22 NOTE — Op Note (Signed)
[name], [name]               ACCOUNT NO.:  [id number]   MEDICAL RECORD NO.:  [id number]          PATIENT TYPE:  INP   LOCATION:  A212                          FACILITY:  [facility]   PHYSICIAN:  R. Roetta Sessions, M.D. DATE OF BIRTH:  03-21-1945   DATE OF PROCEDURE:  11/06/2004  DATE OF DISCHARGE:                                 OPERATIVE REPORT   PROCEDURE:  Endoscopic retrograde cholangiopancreatography with  sphincterotomy and stone extraction.   INDICATIONS FOR PROCEDURE:  The patient is a 65 year old lady admitted to  the hospital with biliary pancreatitis. Ultrasound suggests common bile duct  stone. There is intrahepatic and extrahepatic biliary dilation. No evidence  of pancreatic mass on CT. Amylase and lipase have improved. The patient is  improved clinically. LFTs were initially markedly elevated and now have  improved as well; however, they are not normal.   ERCP is now being done to decompress the biliary tree prior to  cholecystectomy. This approach has been discussed with the patient and  patient's husband and family at length. Potential risks and benefits have  been reviewed. The risks including but not limited to a 1:10 chance of  exacerbating/precipitating pancreatitis, bleeding, reaction to medications,  perforation, and potential for failed or subsequent procedure was fully  reviewed. All questions were answered. All parties were agreeable. Please  see the documentation in the medical record. The patient is on IV Unasyn.   PROCEDURE NOTE:  The patient was placed under general anesthesia in the OR  by Dr. Jayme Cloud and associates. She was placed in the semi-prone position.   INSTRUMENT:  Olympus video chip system.   FINDINGS:  Cursory examination of the distal esophagus and stomach revealed  no abnormalities. Pylorus patent and easily traversed. Examination of bulb  and second portion revealed  no abnormalities. The ampulla of Vater was  readily identified on  the medial wall and the second portion of the  duodenum. Scope was pulled back to the short position, 50 cc from the  incisors. Scout film was taken. Using the Microvasive sphincterotome, the  ampulla was gently impacted. Using guidewire palpation, an almost  instantaneous steep cannulation with the guidewire into the biliary tree was  obtained. The sphincterotome followed. Cholangiogram was obtained. There was  a questionable single filling defect in the distal duct. The biliary tree  was mildly diffusely dilated. There was no stricture.   The sphincterotome was pulled back across the ampullary orifice, and a  sphincterotomy was performed at the 12 o'clock position, approximately 1 cm  in length, using the ERBE unit without apparent complications. Subsequently,  sphincterotome was railed off. The wire and the graduated balloon was placed  deep in the biliary tree to the confluence. It was inflated to accommodate  the size of the duct.  __________ cholangiogram was obtained. With this  maneuver, small stone fragments and an approximately 6-mm mixed pigment  cholesterol stone was recovered through the ampullary orifice. A second pass  recovered a few other pieces of debris. There was excellent drainage and no  residual filling defect after this maneuver was performed. The pancreatic  duct was not manipulated/injected. The patient appeared to tolerate the  procedure very well. She was taken to PACU.   IMPRESSION:  1.  Normal appearing ampulla.  2.  Mildly diffusely dilated biliary tree.  3.  Choledocholithiasis  status post sphincterotomy and stone extraction as      described above.   RECOMMENDATIONS:  1.  Clear liquid diet.  2.  Check LFTs, amylase, lipase tomorrow morning.  3.  Continue __________ for now.  4.  Cholecystectomy per Dr. [name].      Jonathon Bellows, M.D.  Electronically Signed     RMR/MEDQ  D:  11/06/2004  T:  11/06/2004  Job:  [id number]   cc:   Dirk Dress.  [name], M.D.  Fax: [phone]

## 2010-06-22 NOTE — Group Therapy Note (Signed)
NAMEARLEE, BOSSARD               ACCOUNT NO.:  [id number]   MEDICAL RECORD NO.:  [id number]          PATIENT TYPE:  INP   LOCATION:  A341                          FACILITY:  [facility]   PHYSICIAN:  Edward L. Juanetta Gosling, M.D.DATE OF BIRTH:  [birth year]   DATE OF PROCEDURE:  11/11/2004  DATE OF DISCHARGE:                                   PROGRESS NOTE   PROBLEMS:  1.  Abnormal chest x-ray, possible tuberculosis.  2.  Cholecystitis.   SUBJECTIVE:  Ms. Fitzgibbon seems about the same.  She remains short of breath.  She is sleeping presently.   OBJECTIVE:  GENERAL:  Her physical examination shows she still appears to be  somewhat dyspneic.  VITAL SIGNS:  Temperature is 98.9, pulse 98, respirations 16, blood pressure  123/68, O2 saturations 100% on 50% O2.   White count 9400, hemoglobin 8.9, hematocrit 25.9, platelets 372.  Electrolytes normal with glucose 113.  Her acid-fast smear x1 is negative.   ASSESSMENT:  Things seem to be going fairly well.   PLAN:  Plan is to continue with current medications and treatments including  the TB medications until we can rule out tuberculosis as a cause of the  problem.      Edward L. Juanetta Gosling, M.D.  Electronically Signed     ELH/MEDQ  D:  11/11/2004  T:  11/12/2004  Job:  [id number]

## 2010-06-22 NOTE — Discharge Summary (Signed)
[name], [name]               ACCOUNT NO.:  [id number]   MEDICAL RECORD NO.:  [id number]          PATIENT TYPE:  INP   LOCATION:  A302                          FACILITY:  [facility]   PHYSICIAN:  Calvert Cantor, M.D.     DATE OF BIRTH:  1945-09-15   DATE OF ADMISSION:  11/04/2004  DATE OF DISCHARGE:  10/14/2006LH                                 DISCHARGE SUMMARY   GASTROENTEROLOGIST:  1.  Lionel December, M.D.  2.  R. Roetta Sessions, M.D.   DISCHARGE DIAGNOSES:  1.  Hospital-acquired bilateral upper lobe pneumonia.  2.  Gallstone pancreatitis, now relieved.  3.  Cholelithiasis.  4.  Chronic obstructive pulmonary disease.  5.  History of chronic back pain, status post spinal fusion.  6.  Hypertension.  7.  History of depression and anxiety.  8.  History of peripheral vascular disease.   PROCEDURES DURING THIS ADMISSION:  1.  ERCP with sphincterotomy and stone extraction done on November 06, 2004,      by Dr. Jena Gauss.  2.  Ultrasound-guided thoracentesis of right pleural effusion done on      November 09, 2004.   HOSPITAL COURSE:  This is a 65 year old white female who was admitted with  abdominal pain on November 04, 2004, and was found to have pancreatitis  secondary to gallstones.  A consult was placed for Dr. Karilyn Cota and Dr. Jena Gauss  who subsequently performed an ERCP and retrieved the gallstone.  The patient  continued to have some abdominal pain even after the ERCP, however, her  blood work, including her amylase and lipase, actually improved.   The patient also had a consult with Dr. [name] who was planning to do a  cholecystectomy once her pancreatitis resolved.   However, on the night of November 06, 2004, the patient became hypoxic.  She  was placed on O2.  Her pulse ox increased, however, ABG done the following  morning showed a PO2 of 48 with an oxygen saturation of 88.2%.  Her PCO2 was  low at 30.9, pH was 7.466.  The patient was subsequently transferred to the  ICU and placed on  a Hi-con mask to ensure adequate oxygenation.  Chest x-ray  done on November 07, 2004, initially showed a small right pleural effusion and  new symmetric upper lobe reticular opacities.  A followup CT scan was  ordered.  Results showed extensive scarring with bronchiectasis and volume  loss in both upper lobes and the right middle lobe with relative sparing in  the lower lobes bilaterally.  In addition to this, there was mediastinal and  bihilar adenopathy and bilateral pleural effusions.  A post-infectious  etiology, including TB, was to be considered.   The patient was placed in respiratory isolation.  She was started on IV  antibiotics to cover for aspiration pneumonia.  In addition, a PPD was  placed.  Subsequently, the patient was started on anti-TB treatment due to  the conspicuous upper lobe infiltrates and poor respiratory status.   The PPD, however, was negative.  Three sets of sputum were collected for  AFB, which also all  returned negative.  At this point, isolation and TB  treatment were all discontinued.  However, the patient was continued on  antibiotics for CAP and possible aspiration.  Her white count did increase  and she did develop fevers, and therefore her antibiotics were modified to  cover broad spectrum bacterial etiologies since her cultures were negative  thus far.  The patient continued to require 50% O2 to maintain a pulse ox  above 90.   In addition to her respiratory problems, the patient continued to have mild  abdominal pain, however, her LFT's and lipase remained negative.  She was  unable to tolerate a diet and her albumin dropped to 1.  Therefore, the  patient was started on protein shakes to provide high calories and protein  during her acute illness.   Over the next few days, the patient did improve slowly.  Her Oxygen was  titrated down to 40% and then 30%.  Today, the patient is requiring 2 L of  O2 to maintain an oxygen saturation above 90%.   Her  chest x-ray, however, has not improved much and continues to show  abnormal interstitial markings in the bilateral upper lobes.  Most likely,  this is scarring resulting from her previous pneumonia and this current  pneumonia.  The patient has been told that she can no longer smoke.  She  will be continued on oxygen for another week, after which a pulse ox will be  checked at her primary care physician's office to decide whether or not she  needs any further oxygen therapy.   The patient is to be continued on antibiotics for another five days which  she will be receiving as an outpatient.   In addition to the above problems, the patient developed a large area on her  sacrum, buttocks, and thighs of Candida dermatitis.  In addition, she  developed thrush.  She was placed on IV Diflucan and received Nystatin for  the thrush and the rash.  She is no longer showing any vesicles, the  erythema has decreased, and the skin is beginning to peel, showing notable  improvement.  The patient is to continue to apply Nystatin as an outpatient  t.i.d.  In addition, she will be maintained on Diflucan for another five  days.  The Candida infection is most likely secondary to immunosuppression  from her pneumonia in addition to multiple antibiotics required to treat the  infection.   The patient is able to ambulate today with mild shortness of breath.  She  has no shortness of breath at rest.  She has not had any wheezing for the  past three or four days.  Her nebulizer treatments have been made p.r.n.   The patient is being discharged home with:  1.  Home O2.  2.  Nebulizer for use at home.   DISCHARGE MEDICATIONS:  1.  Augmentin 875 mg q.12h. for two more days.  2.  Levaquin 750 mg IV for five more days.  3.  Diflucan 100 mg IV for five more days.  4.  Proventil nebulizer q.6h. p.r.n.  5.  Albuterol inhaler q.6h. p.r.n. 6.  Advair one puff b.i.d.  7.  Nystatin cream to apply to rash t.i.d.  8.   In addition, the patient has previously been on the following      medications:  Percocet 10/325 mg one tab q.6h., Valium 10 mg t.i.d.      p.r.n., Zyprexa 10 mg daily, Prozac 60 mg daily.  LABORATORY DATA:  Blood work on discharge:  WBC has decreased to 8.5,  hemoglobin 9.6, hematocrit 28.3, platelets 592.  Sodium 136, potassium 4.6,  chloride 101, bicarbonate 28, glucose 123, BUN 4, creatinine 0.7.  Total  bilirubin 0.5, alkaline phosphatase 91, AST 24, ALT 18, total protein 5.5,  albumin has improved to 2.3, calcium 8.5.  Thoracentesis fluid had a LDH  level of 133, total protein was 1.8.   Microbiology:  The patient had multiple sets of sputum cultures, blood  cultures, and AFB smears, all of which have been negative.  Sputum cultures  do show WBC's, however, no bacteria has been cultured which is why she is on  such broad spectrum antibiotic on discharge.   The patient is to have a cholecystectomy done by Dr. [name] once she has  completely recovered from this episode of pneumonia.  She will have a follow  up appointment with Dr. [name] for five days from now.   The patient is to follow up with Dr. Eliberto Ivory next week.  I have only given  her 30 tablets of Percocet which she states that she requires because of  severe back pain.  She can receive further prescriptions from her primary  care physician.   The patient is advised to take a non-fatty, non-oily diet.  She has been  seen by our nutritionist who has explained what type of foods she can eat  and what to avoid.   She can continue on Prosure.   ACTIVITY:  Increase activity slowly.   CONDITION ON DISCHARGE:  Stable.   The patient is being discharged to home.  She has a husband who can care for  her, and administer her medications appropriately.  She has decided to stop  smoking;  however, her husband continues to smoke.  It has been explained to  her that she has to also avoid second-hand smoke.      Calvert Cantor,  M.D.  Electronically Signed     SR/MEDQ  D:  11/17/2004  T:  11/17/2004  Job:  [id number]

## 2010-06-22 NOTE — H&P (Signed)
NAMESYD, MANGES               ACCOUNT NO.:  [id number]   MEDICAL RECORD NO.:  [id number]          PATIENT TYPE:  INP   LOCATION:  A212                          FACILITY:  [facility]   PHYSICIAN:  Margaretmary Dys, M.D.DATE OF BIRTH:  October 21, 1945   DATE OF ADMISSION:  11/04/2004  DATE OF DISCHARGE:  LH                                HISTORY & PHYSICAL   ADMISSION DIAGNOSES:  1.  Acute abdominal pain.  2.  Choledocholithiasis.  3.  Acute cholecystitis, cannot exclude gangrenous gallbladder.  4.  Acute pancreatitis likely related to #2 above.   PRIMARY CARE PHYSICIAN:  Dr. Eliberto Ivory of [place], [place].   CHIEF COMPLAINT:  Abdominal pain of one day duration.   HISTORY OF PRESENT ILLNESS:  [name] is a 65 year old Caucasian  female who presented to the emergency room today with complaints of severe  abdominal pain. The patient reports that her pain, which she describes as  sharp, began in the epigastrium radiating to her right upper quadrant  and  then to her back. It caused her to double over and made her very nauseous.  She says the pain is 10 out of 10 at its worse and only appears to be  relieved intermittently without any particular pattern. The pain appears to  be aggravated by deep breathing or moving. She has had some fever or chills,  although she did not measure her temperature at home. She had some nausea  but no vomiting until she had to drink contrast in the emergency room.   The patient reports similar pain about three weeks ago. She went in to see  Dr. Eliberto Ivory, her primary doctor, who gave her some Nexium and Maalox but no  antibiotics. He, however, told her that he is concerned that this might be  her gallbladder. An ultrasound was recommended here at [facility]  but the patient did not follow up as she thought it was unlikely to be her  gallbladder. The symptoms appeared to resolve spontaneously after about two  to three days, only for it to  return today.   She had some diarrhea, although she said the diarrhea has been chronic and  this is not worse. She has no frequency, urgency, or dysuria. She denies any  cough. No shortness of breath. No hemoptysis. No pleuritic chest pain. She  has no headache, dizziness, or lightheadedness.   Evaluation in the emergency room revealed the patient had severe tenderness  in the right upper quadrant. She also had elevated liver function tests and  amylase and lipase likely suggesting a common bile duct obstruction.   The patient is admitted now for further evaluation and management.   REVIEW OF SYSTEMS:  A 10-point review of systems is otherwise negative  except as mentioned in the history of present illness.   PAST MEDICAL HISTORY:  1.  Chronic back pain, status post fusion surgery.  2.  Chronic obstructive pulmonary disease.  3.  Hypertension.  4.  Anxiety.  5.  Depression.  6.  Peripheral vascular disease.  7.  Status post a colon resection for  diverticulitis.  8.  Status post hemorrhoidectomy.  9.  Status post cesarean section.   MEDICATIONS:  1.  Prozac 60 mg p.o. daily.  2.  Zyprexa 10 mg p.o. daily.  3.  Valium 5 mg p.o. twice daily.  4.  Percocet 1-2 tablets p.o. q.6h. p.r.n. She uses this for chronic back      pain.  5.  She also takes a fluid pill for her blood pressure, which she does not      remember its name.  6.  She is also on Advair 250/50 mg 1 puff inhaled twice daily.  7.  Albuterol nebulizers 2.5 mg q.4h. p.r.n. at home.   ALLERGIES:  She has no known drug allergies.   FAMILY HISTORY:  Positive for father died of lung cancer at age 66. He also  had emphysema. Mother also has chronic bronchitis. No family history of  coronary artery disease or diabetes.   SOCIAL HISTORY:  The patient is married and has been married for 40 years.  Has two daughters-one of her daughters was present during this interview.  She is currently unemployed and has not been able  to get on disability. She  smokes about one pack a day. She used to smoke up to two packs until a few  months ago. She tells me her total pack year history will be close to 100.  She denies any illicit drug use.   PHYSICAL EXAMINATION:  GENERAL:  Conscious, alert, in mild distress.  VITAL SIGNS:  Her blood pressure was 122/74, temperature 97.6, pulse 104,  respiratory rate 22. Pain was 10/10 on arrival. Oxygen saturation 98% on  room air.  HEENT:  Normocephalic and atraumatic. Oral mucosa was dry. No exudates or  thrush was noted.  NECK:  Supple. No lymphadenopathy. No JVD.  LUNGS:  Reduced air entry bilaterally. No crackles or wheezing was heard.  ABDOMEN:  Not distended. It was soft. The patient had some guarding over her  right upper quadrant. The patient was very tender in  the right upper  quadrant and Murphy's sign  was very positive. Bowel sounds were hypoactive.  There was no flank discoloration or periumbilical discoloration.  EXTREMITIES:  No pitting pedal edema. No calf induration or tenderness was  noted.  NEUROLOGICAL:  Grossly intact with no focal deficits.   LABORATORY DATA:  White blood cell count was 13.6, hemoglobin of 15,  hematocrit of 44, platelet count 545,000 with left shift, neutrophils of  95%. Sodium of 134, potassium 3.7, chloride of 97, CO2 26, glucose 160, BUN  of 6, creatinine 0.9. Total bilirubin was 4.4, alkaline phosphate 392, AST  312, ALT of 175, total protein 7.1, albumin is 4.0, calcium is 9.2, amylase  519, lipase 1000. CT of the abdomen and pelvis is pending.   ASSESSMENT/PLAN:  [name] is a 64 year old Caucasian female with  symptoms, signs, and laboratory data consistent with common bile duct  obstruction and acute pancreatitis. More concerning is the patient may be in  early stages of an ascending cholangitis.  I have requested for a CT scan of the abdomen and pelvis to further evaluate  her biliary tree, her pancreas, and her  abdominal organs. Her pain appears  to be fairly controlled at this time. I will continue on scheduled Dilaudid  2 mg IV q.4h. and 1 mg q.2h. p.r.n. We will hydrate her with normal saline  with 20 mEq of potassium chloride.   We will keep her n.p.o. except for  some of her home medications. I will not  hold Valium so she does not go into benzodiazepine withdrawal. I will hold  her blood pressure pill at this time as we are going to hydrate her and  going to keep a close eye on her hemodynamics. I will resume her Advair to  250/50. The patient has received Cipro and Flagyl in the emergency room. I  will switch her to Unasyn 3 gm IV q.6h. I will request gastroenterology to  see her in the morning . If I am called about a potential gangrenous  gallbladder on CT scan, I will contact the surgeon on call right away.  Otherwise, I will put in a consult for Dr. Jena Gauss if the patient has a  cholangitis, she may need ERCP first. I will also put a consult in for Dr.  [name] who is the surgeon on call.   I will watch her blood sugar closely to see if she has any glucose  intolerance. We will follow her amylase and lipase closely and we will  evaluate her pancreas with a CT scan as I discussed above. Gastrointestinal  prophylaxis will be with Protonix. Deep vein thrombosis prophylaxis with  sequential compression device. I will hold off on anticoagulation for now as  the patient may have procedures done in the near future. I have discussed  the above-plan with the patient and the severity of the condition. She  verbalized full understanding.   CODE STATUS:  The patient is a full code.      Margaretmary Dys, M.D.  Electronically Signed     AM/MEDQ  D:  11/04/2004  T:  11/04/2004  Job:  [id number]

## 2010-06-22 NOTE — Op Note (Signed)
[facility]  Patient:    [name], [name]                        MRN: [id number] Proc. Date: 11/12/99 Attending:  Sheppard Plumber. Earlene Plater, M.D.                           Operative Report  PREOPERATIVE DIAGNOSES:  Anal fissure, hemorrhoids, irritable bowel syndrome, history of diverticulitis.  POSTOPERATIVE DIAGNOSES:  Anal fissure, hemorrhoids, irritable bowel syndrome, history of diverticulitis.  INDICATIONS FOR PROCEDURE:  I have seen and followed this patient for over a year. She underwent a colon resection for chronic diverticulitis on July 16, 1999, before that had severe irritable bowel syndrome, diverticulosis and diverticulitis. She had been followed longer by Dr. Victorino Dike. She is on a number of medications including Perdiem and Celexa. She has survived her surgery well. Her bowels are back to normal as far as she can tell and she has ongoing anal rectal spasm, pain, anal fissure, moderate hemorrhoidal discomfort. Because of these ongoing symptoms, a recent colonoscopy showing no complications from the colon resection with the anastomosis being intact. I have carefully explained the details of the surgery including the expected outcomes and possible complications.  DESCRIPTION OF PROCEDURE:  The patient was brought the operating room, placed supine, LMA anesthesia provided. She was placed lithotomy, perianal area inspected, lubricated, flexible sigmoidoscope introduced and advanced to 50 cm. Both liquid and  slid stool were scattered throughout down to the rectum. I think this is evidence for ongoing chronic constipation and irritable bowel syndrome. I was able to irrigate some of these areas. The lumen of the colon was preserved and intact throughout. I did not ever visualize the anastomosis but the lumen was uniform. The scope was withdrawn, the area was prepped and draped in the usual fashion. The fissure was partially healed posterior.  Anal stenosis was mild. Hemorrhoids were first degree only. I elected to proceed with the repair of the anal fissure which I did by cauterizing the anal fissure and dividing the internal sphincter percutaneously with a 15 blade in the left posterior position. Gelfoam gauze and dry sterile dressing applied. There specifically was no other pathology at the anal rectum. She tolerated this well, dry sterile bandage applied. She was awakened and taken to the recovery room in good condition.  Written and verbal instructions were given to her and her husband including Percocet #24 and she will bee seen and followed as an outpatient. DD:  11/12/99 TD:  11/12/99 Job: [id number]

## 2010-06-22 NOTE — Group Therapy Note (Signed)
NAMEFAREN, FLORENCE               ACCOUNT NO.:  [id number]   MEDICAL RECORD NO.:  [id number]          PATIENT TYPE:  INP   LOCATION:  A341                          FACILITY:  [facility]   PHYSICIAN:  Edward L. Juanetta Gosling, M.D.DATE OF BIRTH:  07/05/1945   DATE OF PROCEDURE:  DATE OF DISCHARGE:                                   PROGRESS NOTE   SUBJECTIVE:  Mrs. Petz is about the same. She has no new complaints. She  continues to have some abdominal pain. She continues to cough.   Her exam shows temp is 99, pulse 94, respirations 24, blood pressure 152/78.  O2 sat is 96% on a 50% percent mask.   As mentioned yesterday she has one negative AFB smear that has been  reported, but today we have one from 11/08/2004, one from 11/09/2004 and one  from 11/10/2004.   Although she has had clearly changes in her x-ray from about 16-18 months  ago we do not have evidence now that this represents active TB, and I think  we can discontinue her isolation. Will discuss with Dr. Orvan Falconer.      Edward L. Juanetta Gosling, M.D.  Electronically Signed     ELH/MEDQ  D:  11/12/2004  T:  11/12/2004  Job:  [id number]

## 2010-06-22 NOTE — H&P (Signed)
[name], [name]               ACCOUNT NO.:  [id number]   MEDICAL RECORD NO.:  [id number]          PATIENT TYPE:  AMB   LOCATION:  DAY                           FACILITY:  [facility]   PHYSICIAN:  [name], M.D.   DATE OF BIRTH:  1945-03-17   DATE OF ADMISSION:  11/30/2004  DATE OF DISCHARGE:  LH                                HISTORY & PHYSICAL   This is a 65 year old female admitted previously to the hospital on November 04, 2004 with abdominal pain. She was found to have gallstone pancreatitis.  She had an ERCP with a sphincterotomy and stone extraction on November 06, 2004. She developed post-procedure hospital acquired bilateral upper lobe  pneumonia with hypoxemia. She was also found to have extensive scarring and  bronchiectasis with volume loss in both upper lobes and the right middle  lobe. Tuberculosis was ruled out. The patient was treated with intravenous  antibiotics for possible aspiration pneumonia. She has done well except that  there are still chronic interstitial markings in bilateral upper lobes. She  is no longer hypoxic. The patient does not have nausea, vomiting or  diarrhea. Chest x-ray done on November 22, 2004 showed bronchitic changes  with improved interstitial infiltrates bilaterally. She is now scheduled to  undergo laparoscopy with cholecystectomy.   PAST MEDICAL HISTORY:  1.  Chronic obstructive pulmonary disease.  2.  Chronic low back pain status post fusion.  3.  Hypertension.  4.  Peripheral vascular disease.  5.  Depression with anxiety.   She is followed primarily by Dr. Eliberto [name] in [place], [place].   MEDICATIONS:  1.  Proventil nebulizer q.6h.  2.  Albuterol inhaler q.6h.  3.  Advair one puff b.i.d.  4.  Percocet 10/325 q.6h p.r.n.  5.  Valium 10 mg t.i.d. p.r.n.  6.  Zyprexa 10 mg daily.  7.  Prozac 60 mg daily.   PHYSICAL EXAMINATION:  VITAL SIGNS:  Blood pressure 128/86, pulse 84,  respirations 20. Weight 124 pounds.  HEENT:   Unremarkable.  NECK:  Supple without JVD, bruit, adenopathy or thyromegaly.  LUNGS:  Clear to auscultation. Excellent air movement. No rales, rubs,  rhonchi or wheezes.  HEART:  Regular rate and rhythm without murmur, gallop or rub.  ABDOMEN:  Mild right upper quadrant epigastric tenderness, no masses. Normal  active bowel sounds.  EXTREMITIES:  No clubbing, cyanosis or edema.  NEUROLOGIC EXAM:  No focal motor, sensory or cerebellar deficit.   IMPRESSION:  1.  Cholecystitis, cholelithiasis.  2.  History of gallstone pancreatitis due to choledocholithiasis, stable      status post endoscopic retrograde cholangiopancreatography with      sphincterotomy and stone extraction.  3.  Bilateral upper lobe pneumonitis, resolved.  4.  Chronic obstructive lung disease, stable.  5.  Hypertension.  6.  Chronic low back pain.   PLAN:  Laparoscopic cholecystectomy.      [name], M.D.  Electronically Signed     LCS/MEDQ  D:  11/29/2004  T:  11/29/2004  Job:  [id number]

## 2010-08-01 ENCOUNTER — Other Ambulatory Visit (HOSPITAL_COMMUNITY): Payer: Self-pay | Admitting: Gastroenterology

## 2010-08-01 ENCOUNTER — Other Ambulatory Visit (HOSPITAL_COMMUNITY): Payer: Medicare Other

## 2010-08-01 ENCOUNTER — Ambulatory Visit (INDEPENDENT_AMBULATORY_CARE_PROVIDER_SITE_OTHER): Payer: Medicare Other | Admitting: Gastroenterology

## 2010-08-01 ENCOUNTER — Ambulatory Visit (HOSPITAL_COMMUNITY): Payer: Medicare Other

## 2010-08-01 ENCOUNTER — Encounter: Payer: Self-pay | Admitting: Gastroenterology

## 2010-08-01 VITALS — BP 122/64 | HR 110 | Ht 60.0 in | Wt 156.4 lb

## 2010-08-01 DIAGNOSIS — J449 Chronic obstructive pulmonary disease, unspecified: Secondary | ICD-10-CM

## 2010-08-01 DIAGNOSIS — Z86010 Personal history of colonic polyps: Secondary | ICD-10-CM

## 2010-08-01 DIAGNOSIS — R1319 Other dysphagia: Secondary | ICD-10-CM

## 2010-08-01 DIAGNOSIS — K219 Gastro-esophageal reflux disease without esophagitis: Secondary | ICD-10-CM

## 2010-08-01 NOTE — Patient Instructions (Signed)
You have been scheduled for a Barium Swallow/ Modified Barium Swallow. Please arrive at 1st floor [facility] on 08/09/10 at 11:00am arriving at 10:45am.  We will contact after these results come back.  cc: Doreen Beam, MD

## 2010-08-01 NOTE — Progress Notes (Signed)
History of Present Illness: This is a 65 year old female who relates a 6 month history of difficulty swallowing. She states she chokes and coughs when she eats certain solid foods particularly bread. She occasionally notes the same problem with liquids. Her symptoms have not progressed. She has not been seen in our office in approximately 10 years. She underwent upper endoscopy in August 2001 that showed gastritis and mild duodenitis. She has a long history of GERD and is treated with omeprazole 20 mg daily with good control of heartburn and reflux symptoms. Her most recent colonoscopy was in April 2001 showing multiple colon polyps, which proved to be only polypoid mucosa, and a very tortuous sigmoid colon. She subsequently underwent a sigmoid colectomy. She underwent cholecystectomy 2006 and had an ERCP and sphincterotomy by Dr. Roetta Sessions in 2006. She has a long history of irritable bowel syndrome with alternating diarrhea and constipation. She denies weight loss change in bowel, melena, hematochezia, nausea, vomiting, change in appetite  Past Medical History  Diagnosis Date  . GERD (gastroesophageal reflux disease)   . Allergic rhinitis   . Pneumonia   . Hypertension   . Diabetes mellitus, type 2   . 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 Duodenal ulcer   . Hiatal hernia   . Diverticulosis   . IBS (irritable bowel syndrome)   . Diverticulitis 2001  . Anxiety   . Chronic airway obstruction, not elsewhere classified   . COPD (chronic obstructive pulmonary disease)     stage 4    Past Surgical History  Procedure Date  . Cesarean section 1971  . Abdominal hysterectomy 1980  . Colectomy 07/1999    Sigmoid  . Rod in left leg   . Cholecystectomy     ERCP sphincterotomy, stone extraction 2006    reports that she quit smoking about 4 weeks ago. Her smoking use included Cigarettes. She smoked .5 packs per day. She does not have any smokeless  tobacco history on file. She reports that she does not drink alcohol or use illicit drugs. family history includes Aneurysm in her mother; Diabetes in her sisters; and Lung cancer in her father. Allergies  Allergen Reactions  . Nsaids     Hx of gastric ulcers   Outpatient Encounter Prescriptions as of 08/01/2010  Medication Sig Dispense Refill  . albuterol (PROAIR HFA) 108 (90 BASE) MCG/ACT inhaler Inhale 2 puffs into the lungs 4 (four) times daily.        . budesonide-formoterol (SYMBICORT) 160-4.5 MCG/ACT inhaler Inhale 2 puffs into the lungs 2 (two) times daily.        . cetirizine (ZYRTEC) 10 MG tablet Take 10 mg by mouth daily.        . diazepam (VALIUM) 10 MG tablet Take 10 mg by mouth every 12 (twelve) hours as needed.        Marland Kitchen FLUoxetine (PROZAC) 20 MG capsule Take 40 mg by mouth daily.        . hydrochlorothiazide 25 MG tablet Take 12.5 mg by mouth daily.        Marland Kitchen ipratropium-albuterol (DUONEB) 0.5-2.5 (3) MG/3ML SOLN Take 3 mLs by nebulization every 4 (four) hours as needed.        . meclizine (ANTIVERT) 25 MG tablet Take .5-1 tablet by mouth three times a day as needed       . OLANZapine (ZYPREXA) 10 MG tablet Take 10 mg by mouth at bedtime.        Marland Kitchen  omeprazole (PRILOSEC) 20 MG capsule Take 20 mg by mouth daily.        Marland Kitchen oxyCODONE-acetaminophen (PERCOCET) 10-325 MG per tablet Take 1 tablet by mouth 4 (four) times daily.        . pseudoephedrine-acetaminophen (TYLENOL SINUS) 30-500 MG TABS Take 1 tablet by mouth every 4 (four) hours as needed.        . theophylline (THEO-24) 200 MG 24 hr capsule Take 200 mg by mouth 2 (two) times daily.        Marland Kitchen tiotropium (SPIRIVA) 18 MCG inhalation capsule Place 18 mcg into inhaler and inhale daily.        . traMADol (ULTRAM) 50 MG tablet Take 1-2 tabs by mouth every 6 hours as needed       . cyclobenzaprine (FLEXERIL) 10 MG tablet Take 10 mg by mouth every 8 (eight) hours.        Marland Kitchen DISCONTD: Fluticasone-Salmeterol (ADVAIR DISKUS) 500-50 MCG/DOSE  AEPB Inhale 1 puff into the lungs every 12 (twelve) hours.        Marland Kitchen DISCONTD: levalbuterol (XOPENEX) 1.25 MG/3ML nebulizer solution Take 1 ampule by nebulization every 4 (four) hours as needed.        Marland Kitchen DISCONTD: promethazine (PHENERGAN) 25 MG tablet Take 25 mg by mouth 4 (four) times daily as needed.        Marland Kitchen DISCONTD: sucralfate (CARAFATE) 1 G tablet Take 1 g by mouth every 4 (four) hours as needed.          Review of Systems: Pertinent positive and negative review of systems were noted in the above HPI section. All other review of systems were otherwise negative.  Physical Exam: General: Well developed , well nourished, chronically ill-appearing with continuous oxygen by nasal cannula Head: Normocephalic and atraumatic Eyes:  sclerae anicteric, EOMI Ears: Normal auditory acuity Mouth: No deformity or lesions Neck: Supple, no masses or thyromegaly Lungs: Markedly decreased breath sounds bilaterally Heart: Regular rate and rhythm; no murmurs, rubs or bruits Abdomen: Soft, non tender and non distended. No masses, hepatosplenomegaly or hernias noted. Normal Bowel sounds Musculoskeletal: Symmetrical with no gross deformities  Skin: No lesions on visible extremities Pulses:  Normal pulses noted Extremities: No clubbing, cyanosis, edema or deformities noted Neurological: Alert oriented x 4, grossly nonfocal Cervical Nodes:  No significant cervical adenopathy Inguinal Nodes: No significant inguinal adenopathy Psychological:  Alert and cooperative. Normal mood and affect  Assessment and Recommendations:  1. Solid food dysphasia with coughing and possible aspiration. Rule out oropharyngeal dysphagia, Zenker's diverticulum, esophageal strictures, neoplasms. Further evaluation with a modified barium swallow study and barium esophagram. Upper endoscopy with dilation might be necessary based on above findings. She is at increased risk of cardiopulmonary complications due to her severe  oxygen-dependent COPD. Propofol sedation would be safer if endoscopy is needed.  2. Personal history of tubulovillous adenoma. She is not a candidate for surveillance colonoscopy due to her severe oxygen-dependent COPD.  3. Irritable bowel syndrome. Stable.  4. Status post cholecystectomy and status post ERCP with stone extraction in 2006.  5. GERD. Continue omeprazole 20 mg daily and standard antireflux measures.  6. COPD, severe, oxygen dependent.

## 2010-08-02 ENCOUNTER — Encounter: Payer: Self-pay | Admitting: Gastroenterology

## 2010-08-03 ENCOUNTER — Other Ambulatory Visit: Payer: Self-pay | Admitting: Gastroenterology

## 2010-08-09 ENCOUNTER — Ambulatory Visit (HOSPITAL_COMMUNITY)
Admission: RE | Admit: 2010-08-09 | Discharge: 2010-08-09 | Disposition: A | Discharge status: Home / Self Care | Payer: Medicare Other | Source: Ambulatory Visit | Attending: Gastroenterology | Admitting: Gastroenterology

## 2010-08-09 ENCOUNTER — Other Ambulatory Visit (HOSPITAL_COMMUNITY): Payer: Medicare Other

## 2010-08-09 DIAGNOSIS — R1319 Other dysphagia: Secondary | ICD-10-CM

## 2010-08-09 DIAGNOSIS — R1310 Dysphagia, unspecified: Secondary | ICD-10-CM | POA: Insufficient documentation

## 2010-08-09 DIAGNOSIS — K219 Gastro-esophageal reflux disease without esophagitis: Secondary | ICD-10-CM

## 2010-08-14 ENCOUNTER — Telehealth: Payer: Self-pay

## 2010-08-15 NOTE — Telephone Encounter (Signed)
Informed patient of Modified Barium Swallow report. Pt agreed and verbalized understanding.

## 2010-11-29 ENCOUNTER — Other Ambulatory Visit: Payer: Self-pay

## 2010-11-29 ENCOUNTER — Encounter (HOSPITAL_COMMUNITY): Payer: Self-pay

## 2010-11-29 ENCOUNTER — Inpatient Hospital Stay (HOSPITAL_COMMUNITY)
Admission: EM | Admit: 2010-11-29 | Discharge: 2010-12-06 | DRG: 189 | Disposition: A | Discharge status: Hospice | Payer: Medicare Other | Attending: Internal Medicine | Admitting: Internal Medicine

## 2010-11-29 ENCOUNTER — Emergency Department (HOSPITAL_COMMUNITY): Payer: Medicare Other

## 2010-11-29 DIAGNOSIS — R197 Diarrhea, unspecified: Secondary | ICD-10-CM

## 2010-11-29 DIAGNOSIS — R000 Tachycardia, unspecified: Secondary | ICD-10-CM

## 2010-11-29 DIAGNOSIS — J9600 Acute respiratory failure, unspecified whether with hypoxia or hypercapnia: Principal | ICD-10-CM | POA: Diagnosis present

## 2010-11-29 DIAGNOSIS — I10 Essential (primary) hypertension: Secondary | ICD-10-CM

## 2010-11-29 DIAGNOSIS — E119 Type 2 diabetes mellitus without complications: Secondary | ICD-10-CM | POA: Diagnosis present

## 2010-11-29 DIAGNOSIS — G8929 Other chronic pain: Secondary | ICD-10-CM

## 2010-11-29 DIAGNOSIS — Z9981 Dependence on supplemental oxygen: Secondary | ICD-10-CM

## 2010-11-29 DIAGNOSIS — M549 Dorsalgia, unspecified: Secondary | ICD-10-CM

## 2010-11-29 DIAGNOSIS — E871 Hypo-osmolality and hyponatremia: Secondary | ICD-10-CM | POA: Diagnosis present

## 2010-11-29 DIAGNOSIS — D72829 Elevated white blood cell count, unspecified: Secondary | ICD-10-CM | POA: Diagnosis present

## 2010-11-29 DIAGNOSIS — J449 Chronic obstructive pulmonary disease, unspecified: Secondary | ICD-10-CM

## 2010-11-29 DIAGNOSIS — B370 Candidal stomatitis: Secondary | ICD-10-CM | POA: Diagnosis present

## 2010-11-29 DIAGNOSIS — J9691 Respiratory failure, unspecified with hypoxia: Secondary | ICD-10-CM | POA: Diagnosis present

## 2010-11-29 DIAGNOSIS — Z515 Encounter for palliative care: Secondary | ICD-10-CM

## 2010-11-29 DIAGNOSIS — J441 Chronic obstructive pulmonary disease with (acute) exacerbation: Secondary | ICD-10-CM

## 2010-11-29 DIAGNOSIS — R7401 Elevation of levels of liver transaminase levels: Secondary | ICD-10-CM

## 2010-11-29 LAB — BASIC METABOLIC PANEL
BUN: 14 mg/dL (ref 6–23)
CO2: 33 mEq/L — ABNORMAL HIGH (ref 19–32)
Chloride: 76 mEq/L — ABNORMAL LOW (ref 96–112)
Creatinine, Ser: 0.71 mg/dL (ref 0.50–1.10)
Glucose, Bld: 132 mg/dL — ABNORMAL HIGH (ref 70–99)

## 2010-11-29 LAB — CBC
HCT: 37.5 % (ref 36.0–46.0)
Hemoglobin: 12.5 g/dL (ref 12.0–15.0)
MCHC: 33.3 g/dL (ref 30.0–36.0)
RBC: 4.1 MIL/uL (ref 3.87–5.11)

## 2010-11-29 LAB — BLOOD GAS, ARTERIAL
Drawn by: 22223
O2 Content: 3 L/min
pCO2 arterial: 55.8 mmHg — ABNORMAL HIGH (ref 35.0–45.0)
pH, Arterial: 7.363 (ref 7.350–7.400)
pO2, Arterial: 73.2 mmHg — ABNORMAL LOW (ref 80.0–100.0)

## 2010-11-29 LAB — DIFFERENTIAL
Basophils Relative: 0 % (ref 0–1)
Lymphocytes Relative: 13 % (ref 12–46)
Lymphs Abs: 2.5 10*3/uL (ref 0.7–4.0)
Monocytes Absolute: 1.5 10*3/uL — ABNORMAL HIGH (ref 0.1–1.0)
Monocytes Relative: 8 % (ref 3–12)
Neutro Abs: 15 10*3/uL — ABNORMAL HIGH (ref 1.7–7.7)

## 2010-11-29 LAB — TROPONIN I: Troponin I: <0.3 ng/mL (ref <0.30)

## 2010-11-29 MED ORDER — IPRATROPIUM BROMIDE 0.02 % IN SOLN
0.5000 mg | Freq: Once | RESPIRATORY_TRACT | Status: AC
  Administered 2010-11-29: 0.5 mg via RESPIRATORY_TRACT

## 2010-11-29 MED ORDER — MORPHINE SULFATE 4 MG/ML IJ SOLN
4.0000 mg | Freq: Once | INTRAMUSCULAR | Status: AC
  Administered 2010-11-29: 4 mg via INTRAVENOUS
  Filled 2010-11-29: qty 1

## 2010-11-29 MED ORDER — MOXIFLOXACIN HCL IN NACL 400 MG/250ML IV SOLN
400.0000 mg | Freq: Once | INTRAVENOUS | Status: AC
  Administered 2010-11-29: 400 mg via INTRAVENOUS
  Filled 2010-11-29: qty 250

## 2010-11-29 MED ORDER — METHYLPREDNISOLONE SODIUM SUCC 125 MG IJ SOLR
125.0000 mg | Freq: Once | INTRAMUSCULAR | Status: AC
  Administered 2010-11-29: 125 mg via INTRAVENOUS
  Filled 2010-11-29: qty 2

## 2010-11-29 MED ORDER — IPRATROPIUM BROMIDE 0.02 % IN SOLN
RESPIRATORY_TRACT | Status: AC
  Administered 2010-11-29: 0.5 mg
  Filled 2010-11-29: qty 2.5

## 2010-11-29 MED ORDER — LEVALBUTEROL HCL 0.63 MG/3ML IN NEBU
0.6300 mg | INHALATION_SOLUTION | Freq: Once | RESPIRATORY_TRACT | Status: AC
  Administered 2010-11-29: 0.63 mg via RESPIRATORY_TRACT
  Filled 2010-11-29: qty 3

## 2010-11-29 MED ORDER — OXYCODONE-ACETAMINOPHEN 5-325 MG PO TABS
1.0000 | ORAL_TABLET | Freq: Once | ORAL | Status: AC
  Administered 2010-11-29: 1 via ORAL
  Filled 2010-11-29: qty 1

## 2010-11-29 MED ORDER — LEVALBUTEROL HCL 1.25 MG/0.5ML IN NEBU
INHALATION_SOLUTION | RESPIRATORY_TRACT | Status: AC
  Filled 2010-11-29: qty 0.5

## 2010-11-29 MED ORDER — LEVALBUTEROL HCL 1.25 MG/0.5ML IN NEBU
1.2500 mg | INHALATION_SOLUTION | Freq: Once | RESPIRATORY_TRACT | Status: AC
  Administered 2010-11-29: 1.25 mg via RESPIRATORY_TRACT

## 2010-11-29 MED ORDER — SODIUM CHLORIDE 0.9 % IV SOLN
Freq: Once | INTRAVENOUS | Status: AC
  Administered 2010-11-29: 20 mL via INTRAVENOUS

## 2010-11-29 MED ORDER — IPRATROPIUM BROMIDE 0.02 % IN SOLN
0.5000 mg | Freq: Once | RESPIRATORY_TRACT | Status: AC
  Administered 2010-11-29: 0.5 mg via RESPIRATORY_TRACT
  Filled 2010-11-29: qty 2.5

## 2010-11-29 NOTE — ED Notes (Signed)
Pt breathing easier at this time

## 2010-11-29 NOTE — ED Provider Notes (Addendum)
History     CSN: [id number] Arrival date & time: 11/29/2010  7:53 PM   First MD Initiated Contact with Patient 11/29/10 2004      Chief Complaint  Patient presents with  . Shortness of Breath  . Wheezing    (Consider location/radiation/quality/duration/timing/severity/associated sxs/prior treatment) Patient is a 65 y.o. female presenting with shortness of breath and wheezing. The history is provided by the patient.  Shortness of Breath  The current episode started 3 to 5 days ago. The problem has been gradually worsening. The problem is severe. The symptoms are relieved by nothing. Associated symptoms include chest pain, cough, shortness of breath and wheezing.  Wheezing  Associated symptoms include chest pain, cough, shortness of breath and wheezing.   patient's had shortness of breath since Monday. She states she is a little bit of a cough with minimal production. No fevers. She's had numerous breathing treatments both at home by EMS without relief. She states there is some chest pain with it. She states she is having trouble walking. She has a history of COPD and CHF. She states she just is having trouble breathing.  Past Medical History  Diagnosis Date  . GERD (gastroesophageal reflux disease)   . Allergic rhinitis   . Pneumonia   . Hypertension   . Diabetes mellitus, type 2   . 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 Duodenal ulcer   . Hiatal hernia   . Diverticulosis   . IBS (irritable bowel syndrome)   . Diverticulitis 2001  . Anxiety   . Chronic airway obstruction, not elsewhere classified   . 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 CHF (congestive heart failure)     Past Surgical History  Procedure Date  . Cesarean section 1971  . Abdominal hysterectomy 1980  . Colectomy 07/1999    Sigmoid  . Rod in left leg   . Cholecystectomy     ERCP sphincterotomy, stone extraction 2006    Family  History  Problem Relation Age of Onset  . Aneurysm Mother   . Lung cancer Father   . Diabetes Sister   . Diabetes Sister     History  Substance Use Topics  . Smoking status: Former Smoker -- 0.5 packs/day    Types: Cigarettes    Quit date: 07/01/2010  . Smokeless tobacco: Not on file  . Alcohol Use: No    OB History    Grav Para Term Preterm Abortions TAB SAB Ect Mult Living                  Review of Systems  Unable to perform ROS: Other  Constitutional:        Unable to perform review of systems due to dyspnea.  Respiratory: Positive for cough, shortness of breath and wheezing.   Cardiovascular: Positive for chest pain.  Gastrointestinal: Negative for abdominal pain.    Allergies  Nsaids  Home Medications   Current Outpatient Rx  Name Route Sig Dispense Refill  . PREDNISONE 20 MG PO TABS Oral Take 20 mg by mouth daily.      . ALBUTEROL SULFATE HFA 108 (90 BASE) MCG/ACT IN AERS Inhalation Inhale 2 puffs into the lungs 4 (four) times daily.      . BUDESONIDE-FORMOTEROL FUMARATE 160-4.5 MCG/ACT IN AERO Inhalation Inhale 2 puffs into the lungs 2 (two) times daily.      Marland Kitchen CETIRIZINE HCL 10 MG  PO TABS Oral Take 10 mg by mouth daily.      . CYCLOBENZAPRINE HCL 10 MG PO TABS Oral Take 10 mg by mouth every 8 (eight) hours.      Marland Kitchen DIAZEPAM 10 MG PO TABS Oral Take 10 mg by mouth every 12 (twelve) hours as needed.      Marland Kitchen FLUOXETINE HCL 20 MG PO CAPS Oral Take 40 mg by mouth daily.      Marland Kitchen HYDROCHLOROTHIAZIDE 25 MG PO TABS Oral Take 12.5 mg by mouth daily.      . IPRATROPIUM-ALBUTEROL 0.5-2.5 (3) MG/3ML IN SOLN Nebulization Take 3 mLs by nebulization every 4 (four) hours as needed.      . MECLIZINE HCL 25 MG PO TABS  Take .5-1 tablet by mouth three times a day as needed     . OLANZAPINE 10 MG PO TABS Oral Take 10 mg by mouth at bedtime.      . OMEPRAZOLE 20 MG PO CPDR Oral Take 20 mg by mouth daily.      . OXYCODONE-ACETAMINOPHEN 10-325 MG PO TABS Oral Take 1 tablet by mouth 4  (four) times daily.      Marland Kitchen PSEUDOEPHEDRINE-ACETAMINOPHEN 30-500 MG PO TABS Oral Take 1 tablet by mouth every 4 (four) hours as needed.      . THEOPHYLLINE 200 MG PO CP24 Oral Take 200 mg by mouth 2 (two) times daily.      Marland Kitchen TIOTROPIUM BROMIDE MONOHYDRATE 18 MCG IN CAPS Inhalation Place 18 mcg into inhaler and inhale daily.      . TRAMADOL HCL 50 MG PO TABS  Take 1-2 tabs by mouth every 6 hours as needed       BP 164/77  Pulse 125  Resp 36  Ht 5' (1.524 m)  Wt 160 lb (72.576 kg)  BMI 31.25 kg/m2  SpO2 97%  Physical Exam  Nursing note and vitals reviewed. Constitutional: She is oriented to person, place, and time. She appears well-developed and well-nourished. She appears distressed.  HENT:  Head: Normocephalic and atraumatic.  Eyes: EOM are normal. Pupils are equal, round, and reactive to light.  Neck: Normal range of motion. Neck supple.  Cardiovascular: Regular rhythm and normal heart sounds.   No murmur heard.      Tachycardia  Pulmonary/Chest: She is in respiratory distress. She has no wheezes. She has no rales.       Decreased air movement throughout. tachypnea. Moderate respiratory distress. Wheezes. Prolonged expirations.  Abdominal: Soft. Bowel sounds are normal. She exhibits no distension. There is no tenderness. There is no rebound and no guarding.  Musculoskeletal: Normal range of motion.  Neurological: She is alert and oriented to person, place, and time. No cranial nerve deficit.  Skin: Skin is warm and dry.  Psychiatric: She has a normal mood and affect. Her speech is normal.    ED Course  Procedures (including critical care time)   Labs Reviewed  CBC  DIFFERENTIAL  BASIC METABOLIC PANEL  THEOPHYLLINE LEVEL  PRO B NATRIURETIC PEPTIDE  TROPONIN I   No results found.   No diagnosis found.   Date: 11/29/2010  Rate: 127  Rhythm: sinus tachycardia  QRS Axis: right  Intervals: normal  ST/T Wave abnormalities: normal  Conduction Disutrbances:none   Narrative Interpretation: tachycardia is new  Old EKG Reviewed: changes noted    MDM  Moderate shortness of breath. History of COPD. X-ray shows COPD after multiple breathing treatments she still has tachypnea and tachycardia, although both are improved. He  still is having difficulty breathing and still sounds tight. She'll be admitted.  BMP showed severe hyponatremia. She will be admitted to step down now.      Juliet Rude. Rubin Payor, MD 11/29/10 2121  Juliet Rude. Rubin Payor, MD 11/29/10 2144

## 2010-11-29 NOTE — H&P (Signed)
[name] is an 65 y.o. female.    PCP: Darrow Bussing, MD Her pulmonologist is Dr. Orson Aloe in [place].  Chief Complaint: Shortness of breath for a week  HPI: This is a 65 year old, Caucasian female with a past history of oxygen-dependent COPD, and hypertension, who was in her usual of health about a week to week and a half ago, when she started having shortness of breath. She called her doctor and was prescribed prednisone orally. No antibiotics were given. She took her nebulizer treatments at home with no relief. Has dry cough. Denies any fever or chills. Denies any nausea, vomiting. Denies any leg swelling. Denies any sick contacts. She does admit to some pain in both the sides of her chest from the cough. After she received breathing treatment. In the ED, she is feeling some better. Denies any palpitations, or dizziness.   Prior to Admission medications   Medication Sig Start Date End Date Taking? Authorizing Provider  albuterol (PROAIR HFA) 108 (90 BASE) MCG/ACT inhaler Inhale 2 puffs into the lungs 4 (four) times daily.     Yes Historical Provider, MD  albuterol (PROVENTIL) (2.5 MG/3ML) 0.083% nebulizer solution Take 2.5 mg by nebulization every 6 (six) hours as needed. For shortness of breath    Yes Historical Provider, MD  budesonide-formoterol (SYMBICORT) 160-4.5 MCG/ACT inhaler Inhale 2 puffs into the lungs 2 (two) times daily.     Yes Historical Provider, MD  cetirizine (ZYRTEC) 10 MG tablet Take 10 mg by mouth at bedtime.    Yes Historical Provider, MD  cyclobenzaprine (FLEXERIL) 10 MG tablet Take 10 mg by mouth every 8 (eight) hours.     Yes Historical Provider, MD  diazepam (VALIUM) 10 MG tablet Take 10 mg by mouth every 12 (twelve) hours as needed. For anxiety   Yes Historical Provider, MD  FLUoxetine (PROZAC) 20 MG capsule Take 60 mg by mouth daily.    Yes Historical Provider, MD  hydrochlorothiazide 25 MG tablet Take 12.5 mg by mouth at bedtime.    Yes Historical  Provider, MD  ipratropium (ATROVENT HFA) 17 MCG/ACT inhaler Inhale 2 puffs into the lungs 4 (four) times daily.     Yes Historical Provider, MD  ipratropium-albuterol (DUONEB) 0.5-2.5 (3) MG/3ML SOLN Take 3 mLs by nebulization every 4 (four) hours as needed. For shortness of breath   Yes Historical Provider, MD  lisinopril (PRINIVIL,ZESTRIL) 20 MG tablet Take 20 mg by mouth at bedtime.     Yes Historical Provider, MD  mometasone-formoterol (DULERA) 100-5 MCG/ACT AERO Inhale 2 puffs into the lungs 2 (two) times daily.     Yes Historical Provider, MD  OLANZapine (ZYPREXA) 10 MG tablet Take 10 mg by mouth at bedtime.     Yes Historical Provider, MD  omeprazole (PRILOSEC) 20 MG capsule Take 20 mg by mouth daily.     Yes Historical Provider, MD  oxyCODONE-acetaminophen (PERCOCET) 10-325 MG per tablet Take 1 tablet by mouth 4 (four) times daily.     Yes Historical Provider, MD  predniSONE (DELTASONE) 20 MG tablet Take 20 mg by mouth daily.     Yes Historical Provider, MD  pseudoephedrine-acetaminophen (TYLENOL SINUS) 30-500 MG TABS Take 1 tablet by mouth every 4 (four) hours as needed. For symptoms   Yes Historical Provider, MD  theophylline (THEO-24) 200 MG 24 hr capsule Take 200 mg by mouth 2 (two) times daily.     Yes Historical Provider, MD  tiotropium (SPIRIVA) 18 MCG inhalation capsule Place 18 mcg into inhaler and  inhale daily.     Yes Historical Provider, MD  traMADol (ULTRAM) 50 MG tablet Take 50-100 mg by mouth every 6 (six) hours as needed. Take 1-2 tabs by mouth every 6 hours as needed for pain   Yes Historical Provider, MD  meclizine (ANTIVERT) 25 MG tablet 12.5 mg. Take .5-1 tablet by mouth three times a day as needed    Historical Provider, MD    Allergies:  Allergies  Allergen Reactions  . Nsaids     Hx of gastric ulcers    Past Medical History  Diagnosis Date  . GERD (gastroesophageal reflux disease)   . Allergic rhinitis   . Pneumonia   . Hypertension   . Diabetes mellitus,  type 2   . 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 Duodenal ulcer   . Hiatal hernia   . Diverticulosis   . IBS (irritable bowel syndrome)   . Diverticulitis 2001  . Anxiety   . Chronic airway obstruction, not elsewhere classified   . 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 CHF (congestive heart failure)     Past Surgical History  Procedure Date  . Cesarean section 1971  . Abdominal hysterectomy 1980  . Colectomy 07/1999    Sigmoid  . Rod in left leg   . Cholecystectomy     ERCP sphincterotomy, stone extraction 2006    Social History:  reports that she has been smoking Cigarettes.  She has been smoking about .5 packs per day. She does not have any smokeless tobacco history on file. She reports that she does not drink alcohol or use illicit drugs. She states she quit smoking about 2 years ago, but does smoke up to one to 2 cigarettes on a weekly basis.  Family History:  Family History  Problem Relation Age of Onset  . Aneurysm Mother   . Lung cancer Father   . Diabetes Sister   . Diabetes Sister     Review of Systems  Unable to perform ROS: medical condition     Blood pressure 123/69, pulse 114, resp. rate 25, height 5' (1.524 m), weight 72.576 kg (160 lb), SpO2 96.00%. Physical Exam  Vitals reviewed. Constitutional: She is oriented to person, place, and time. She appears well-developed and well-nourished. No distress.  HENT:  Head: Normocephalic and atraumatic.  Mouth/Throat: Mucous membranes are dry. Oropharyngeal exudate present.    Eyes: EOM are normal. Pupils are equal, round, and reactive to light. Right eye exhibits no discharge. Left eye exhibits no discharge. No scleral icterus.  Neck: Normal range of motion. Neck supple. No JVD present. No tracheal deviation present. No thyromegaly present.  Cardiovascular: Regular rhythm.  Tachycardia present.  Exam reveals no friction rub.   No murmur  heard. Pulmonary/Chest: Accessory muscle usage present. No stridor. Tachypnea noted. No respiratory distress. She has decreased breath sounds in the right middle field, the right lower field, the left middle field and the left lower field. She has wheezes in the right lower field and the left lower field. She has no rhonchi. She has no rales.  Abdominal: Soft. She exhibits no distension and no mass. There is no tenderness. There is no rebound and no guarding.  Musculoskeletal: Normal range of motion. She exhibits no edema and no tenderness.  Lymphadenopathy:    She has no cervical adenopathy.  Neurological: She is alert and oriented to person, place, and time. A cranial  nerve deficit is present.  Skin: Skin is warm and dry. She is not diaphoretic. No pallor.  Psychiatric: She has a normal mood and affect.     Results for orders placed during the hospital encounter of 11/29/10 (from the past 48 hour(s))  CBC     Status: Abnormal   Collection Time   11/29/10  8:22 PM      Component Value Range Comment   WBC 19.4 (*) 4.0 - 10.5 (K/uL)    RBC 4.10  3.87 - 5.11 (MIL/uL)    Hemoglobin 12.5  12.0 - 15.0 (g/dL)    HCT 47.8  29.5 - 62.1 (%)    MCV 91.5  78.0 - 100.0 (fL)    MCH 30.5  26.0 - 34.0 (pg)    MCHC 33.3  30.0 - 36.0 (g/dL)    RDW 30.8  65.7 - 84.6 (%)    Platelets 404 (*) 150 - 400 (K/uL)   DIFFERENTIAL     Status: Abnormal   Collection Time   11/29/10  8:22 PM      Component Value Range Comment   Neutrophils Relative 78 (*) 43 - 77 (%)    Neutro Abs 15.0 (*) 1.7 - 7.7 (K/uL)    Lymphocytes Relative 13  12 - 46 (%)    Lymphs Abs 2.5  0.7 - 4.0 (K/uL)    Monocytes Relative 8  3 - 12 (%)    Monocytes Absolute 1.5 (*) 0.1 - 1.0 (K/uL)    Eosinophils Relative 2  0 - 5 (%)    Eosinophils Absolute 0.4  0.0 - 0.7 (K/uL)    Basophils Relative 0  0 - 1 (%)    Basophils Absolute 0.0  0.0 - 0.1 (K/uL)   BASIC METABOLIC PANEL     Status: Abnormal   Collection Time   11/29/10  8:22 PM        Component Value Range Comment   Sodium 115 (*) 135 - 145 (mEq/L)    Potassium 5.1  3.5 - 5.1 (mEq/L)    Chloride 76 (*) 96 - 112 (mEq/L)    CO2 33 (*) 19 - 32 (mEq/L)    Glucose, Bld 132 (*) 70 - 99 (mg/dL)    BUN 14  6 - 23 (mg/dL)    Creatinine, Ser 9.62  0.50 - 1.10 (mg/dL)    Calcium 8.9  8.4 - 10.5 (mg/dL)    GFR calc non Af Amer 89 (*) >90 (mL/min)    GFR calc Af Amer >90  >90 (mL/min)   PRO B NATRIURETIC PEPTIDE     Status: Abnormal   Collection Time   11/29/10  8:22 PM      Component Value Range Comment   BNP, POC 147.8 (*) 0 - 125 (pg/mL)   TROPONIN I     Status: Normal   Collection Time   11/29/10  8:22 PM      Component Value Range Comment   Troponin I <0.30  <0.30 (ng/mL)    Dg Chest Portable 1 View  11/29/2010  *RADIOLOGY REPORT*  Clinical Data: Shortness of breath.  PORTABLE CHEST - 1 VIEW  Comparison: 03/18/2008.  Findings: COPD with hyperinflation.  Heart size upper limits normal.  Calcified tortuous aorta.  Normal osseous structures. Little change from priors.  IMPRESSION: COPD, no active infiltrates.  Original Report Authenticated By: Elsie Stain, M.D.   EKG shows sinus tachycardia 127, with normal axis. Intervals appear to be in the normal range. No Q waves are  noted on this EKG. There is poor R wave progression.  Assessment/Plan  Principal Problem:  *COPD with exacerbation Active Problems:  Hyponatremia  Back pain, chronic  Hypertension  Leukocytosis  Oral candidiasis   #1 COPD exacerbation with acute respiratory failure: Will be treated with nebulizer treatments, steroids, and antibiotics. She still quite tight and using accessory muscles although she reports feeling better. We will proceed with the ABG at this time. We will admit her to the step down unit.  #2 hyponatremia: She does have potassium in the high normal range. I wonder if she does she could have some adrenal insufficiency. We'll proceed with checking cortisol in the morning. We'll  also initiate other workup for her hyponatremia. Check a urine osmolality and urine sodium. Also, check TSH. She does report to consuming a lot of water at home and, so, we'll keep her on fluid restriction for now. She has been on hydrochlorothiazide for many years, so, that is unlikely to cause this level of sodium.  #3 chronic back pain. Continue with Percocet 4 times a day.  #4 history of hypertension. Continue to monitor for now.  #5 leukocytosis is probably from recent steroid use. We'll monitor her for now.  #6 oral candidiasis: Will give her nystatin orally.  Chest pain is likely from her acute respiratory distress. Her troponin is negative.  Patient is a full code. DVT, prophylaxis will be initiated.  Further management decisions will depend on results of further testing and patient's response to treatment.  Rishit Burkhalter 11/29/2010, 10:01 PM

## 2010-11-29 NOTE — ED Notes (Signed)
Dr Rito Ehrlich at bedside spoke with family and pt.  Pt notified of admission to ICU

## 2010-11-29 NOTE — ED Notes (Signed)
Dr pickering notified of sodium results. No new orders received at this time.  Pt currently has NS infusing at 30 ml/hr.

## 2010-11-29 NOTE — ED Notes (Signed)
2A called unable to give report at this time as RN unaware of pt assignment.

## 2010-11-30 ENCOUNTER — Encounter (HOSPITAL_COMMUNITY): Payer: Self-pay | Admitting: *Deleted

## 2010-11-30 DIAGNOSIS — J9691 Respiratory failure, unspecified with hypoxia: Secondary | ICD-10-CM | POA: Diagnosis present

## 2010-11-30 LAB — CBC
HCT: 35.9 % — ABNORMAL LOW (ref 36.0–46.0)
Hemoglobin: 12.7 g/dL (ref 12.0–15.0)
MCV: 90.7 fL (ref 78.0–100.0)
Platelets: 356 10*3/uL (ref 150–400)
RBC: 3.96 MIL/uL (ref 3.87–5.11)
WBC: 16.3 10*3/uL — ABNORMAL HIGH (ref 4.0–10.5)

## 2010-11-30 LAB — COMPREHENSIVE METABOLIC PANEL
Albumin: 3.3 g/dL — ABNORMAL LOW (ref 3.5–5.2)
BUN: 12 mg/dL (ref 6–23)
Calcium: 8.6 mg/dL (ref 8.4–10.5)
Creatinine, Ser: 0.55 mg/dL (ref 0.50–1.10)
Total Bilirubin: 0.4 mg/dL (ref 0.3–1.2)
Total Protein: 6.5 g/dL (ref 6.0–8.3)

## 2010-11-30 LAB — MRSA PCR SCREENING: MRSA by PCR: POSITIVE — AB

## 2010-11-30 LAB — BASIC METABOLIC PANEL
BUN: 19 mg/dL (ref 6–23)
Calcium: 8.9 mg/dL (ref 8.4–10.5)
GFR calc Af Amer: >90 mL/min (ref >90)
GFR calc non Af Amer: 87 mL/min — ABNORMAL LOW (ref >90)
Glucose, Bld: 190 mg/dL — ABNORMAL HIGH (ref 70–99)
Potassium: 4.5 mEq/L (ref 3.5–5.1)
Sodium: 122 mEq/L — ABNORMAL LOW (ref 135–145)

## 2010-11-30 LAB — CARDIAC PANEL(CRET KIN+CKTOT+MB+TROPI)
CK, MB: 12.2 ng/mL (ref 0.3–4.0)
Relative Index: 5.8 — ABNORMAL HIGH (ref 0.0–2.5)
Total CK: 327 U/L — ABNORMAL HIGH (ref 7–177)
Troponin I: <0.3 ng/mL (ref <0.30)
Troponin I: <0.3 ng/mL (ref <0.30)

## 2010-11-30 LAB — TSH: TSH: 0.246 u[IU]/mL — ABNORMAL LOW (ref 0.350–4.500)

## 2010-11-30 MED ORDER — LEVALBUTEROL HCL 0.63 MG/3ML IN NEBU
0.6300 mg | INHALATION_SOLUTION | RESPIRATORY_TRACT | Status: DC | PRN
  Administered 2010-12-04: 0.63 mg via RESPIRATORY_TRACT
  Filled 2010-11-30 (×2): qty 3

## 2010-11-30 MED ORDER — ALUM & MAG HYDROXIDE-SIMETH 400-400-40 MG/5ML PO SUSP
30.0000 mL | Freq: Four times a day (QID) | ORAL | Status: DC | PRN
  Filled 2010-11-30: qty 30

## 2010-11-30 MED ORDER — ACETAMINOPHEN 650 MG RE SUPP: 650.0000 mg | Freq: Four times a day (QID) | RECTAL | Status: DC | PRN

## 2010-11-30 MED ORDER — ONDANSETRON HCL 4 MG/2ML IJ SOLN
4.0000 mg | Freq: Four times a day (QID) | INTRAMUSCULAR | Status: DC | PRN
  Administered 2010-11-30 – 2010-12-01 (×3): 4 mg via INTRAVENOUS
  Filled 2010-11-30 (×3): qty 2

## 2010-11-30 MED ORDER — OXYCODONE-ACETAMINOPHEN 10-325 MG PO TABS
1.0000 | ORAL_TABLET | Freq: Four times a day (QID) | ORAL | Status: DC
  Filled 2010-11-30 (×2): qty 1

## 2010-11-30 MED ORDER — MORPHINE SULFATE 2 MG/ML IJ SOLN
2.0000 mg | INTRAMUSCULAR | Status: DC | PRN
  Administered 2010-11-30 – 2010-12-03 (×5): 2 mg via INTRAVENOUS
  Filled 2010-11-30 (×5): qty 1

## 2010-11-30 MED ORDER — SODIUM CHLORIDE 0.9 % IJ SOLN
INTRAMUSCULAR | Status: AC
  Filled 2010-11-30: qty 3

## 2010-11-30 MED ORDER — LISINOPRIL 10 MG PO TABS
20.0000 mg | ORAL_TABLET | Freq: Every day | ORAL | Status: DC
  Administered 2010-11-30 – 2010-12-03 (×4): 20 mg via ORAL
  Filled 2010-11-30 (×2): qty 2
  Filled 2010-11-30 (×2): qty 1
  Filled 2010-11-30: qty 2

## 2010-11-30 MED ORDER — FLUOXETINE HCL 20 MG PO CAPS
60.0000 mg | ORAL_CAPSULE | Freq: Every day | ORAL | Status: DC
  Administered 2010-11-30 – 2010-12-06 (×7): 60 mg via ORAL
  Filled 2010-11-30 (×7): qty 3

## 2010-11-30 MED ORDER — CYCLOBENZAPRINE HCL 10 MG PO TABS
10.0000 mg | ORAL_TABLET | Freq: Three times a day (TID) | ORAL | Status: DC
  Administered 2010-11-30 – 2010-12-06 (×21): 10 mg via ORAL
  Filled 2010-11-30 (×6): qty 1
  Filled 2010-11-30: qty 2
  Filled 2010-11-30 (×14): qty 1

## 2010-11-30 MED ORDER — PREDNISOLONE 5 MG PO TABS: 60.0000 mg | ORAL_TABLET | Freq: Every day | ORAL | Status: DC

## 2010-11-30 MED ORDER — SODIUM CHLORIDE 0.9 % IJ SOLN
INTRAMUSCULAR | Status: AC
  Filled 2010-11-30: qty 10

## 2010-11-30 MED ORDER — MOXIFLOXACIN HCL IN NACL 400 MG/250ML IV SOLN
400.0000 mg | INTRAVENOUS | Status: DC
  Administered 2010-11-30 – 2010-12-04 (×5): 400 mg via INTRAVENOUS
  Filled 2010-11-30 (×6): qty 250

## 2010-11-30 MED ORDER — IPRATROPIUM BROMIDE 0.02 % IN SOLN
0.5000 mg | RESPIRATORY_TRACT | Status: DC
  Administered 2010-11-30 – 2010-12-03 (×18): 0.5 mg via RESPIRATORY_TRACT
  Filled 2010-11-30 (×18): qty 2.5

## 2010-11-30 MED ORDER — SODIUM CHLORIDE 0.9 % IJ SOLN
INTRAMUSCULAR | Status: AC
  Administered 2010-11-30: 3 mL
  Filled 2010-11-30: qty 6

## 2010-11-30 MED ORDER — OXYCODONE-ACETAMINOPHEN 5-325 MG PO TABS
1.0000 | ORAL_TABLET | Freq: Four times a day (QID) | ORAL | Status: DC
  Administered 2010-11-30 – 2010-12-06 (×23): 1 via ORAL
  Filled 2010-11-30 (×24): qty 1

## 2010-11-30 MED ORDER — DIAZEPAM 5 MG PO TABS
10.0000 mg | ORAL_TABLET | Freq: Two times a day (BID) | ORAL | Status: DC | PRN
  Administered 2010-11-30 – 2010-12-05 (×5): 10 mg via ORAL
  Filled 2010-11-30 (×6): qty 2

## 2010-11-30 MED ORDER — MUPIROCIN 2 % EX OINT
TOPICAL_OINTMENT | Freq: Two times a day (BID) | CUTANEOUS | Status: AC
  Administered 2010-11-30 (×2): 1 via NASAL
  Administered 2010-12-01 (×2): via NASAL
  Administered 2010-12-02: 1 via NASAL
  Administered 2010-12-02: 09:00:00 via NASAL
  Administered 2010-12-03: 1 via NASAL
  Administered 2010-12-03 – 2010-12-04 (×3): via NASAL
  Filled 2010-11-30 (×3): qty 22

## 2010-11-30 MED ORDER — ENOXAPARIN SODIUM 40 MG/0.4ML ~~LOC~~ SOLN
40.0000 mg | SUBCUTANEOUS | Status: DC
  Administered 2010-11-30 – 2010-12-06 (×7): 40 mg via SUBCUTANEOUS
  Filled 2010-11-30 (×7): qty 0.4

## 2010-11-30 MED ORDER — DOCUSATE SODIUM 100 MG PO CAPS
100.0000 mg | ORAL_CAPSULE | Freq: Two times a day (BID) | ORAL | Status: DC
  Administered 2010-11-30 – 2010-12-06 (×12): 100 mg via ORAL
  Filled 2010-11-30 (×13): qty 1

## 2010-11-30 MED ORDER — NYSTATIN 100000 UNIT/ML MT SUSP
10.0000 mL | Freq: Four times a day (QID) | OROMUCOSAL | Status: DC
  Administered 2010-11-30 – 2010-12-06 (×23): 1000000 [IU] via ORAL
  Filled 2010-11-30: qty 10
  Filled 2010-11-30: qty 5
  Filled 2010-11-30 (×8): qty 10
  Filled 2010-11-30: qty 5
  Filled 2010-11-30 (×3): qty 10
  Filled 2010-11-30: qty 5
  Filled 2010-11-30 (×2): qty 10
  Filled 2010-11-30 (×2): qty 5
  Filled 2010-11-30 (×6): qty 10

## 2010-11-30 MED ORDER — OXYCODONE HCL 5 MG PO TABS
5.0000 mg | ORAL_TABLET | Freq: Four times a day (QID) | ORAL | Status: DC
  Administered 2010-11-30 – 2010-12-06 (×24): 5 mg via ORAL
  Filled 2010-11-30 (×25): qty 1

## 2010-11-30 MED ORDER — LEVALBUTEROL HCL 0.63 MG/3ML IN NEBU
0.6300 mg | INHALATION_SOLUTION | RESPIRATORY_TRACT | Status: DC
  Administered 2010-11-30 – 2010-12-03 (×19): 0.63 mg via RESPIRATORY_TRACT
  Filled 2010-11-30 (×18): qty 3

## 2010-11-30 MED ORDER — ONDANSETRON HCL 4 MG PO TABS
4.0000 mg | ORAL_TABLET | Freq: Four times a day (QID) | ORAL | Status: DC | PRN
  Administered 2010-12-01: 4 mg via ORAL
  Filled 2010-11-30: qty 1

## 2010-11-30 MED ORDER — OLANZAPINE 5 MG PO TABS
10.0000 mg | ORAL_TABLET | Freq: Every day | ORAL | Status: DC
  Administered 2010-11-30 – 2010-12-06 (×6): 10 mg via ORAL
  Filled 2010-11-30 (×6): qty 2

## 2010-11-30 MED ORDER — SODIUM CHLORIDE 0.9 % IJ SOLN
INTRAMUSCULAR | Status: AC
  Administered 2010-11-30: 3 mL
  Filled 2010-11-30: qty 3

## 2010-11-30 MED ORDER — SENNA 8.6 MG PO TABS: 2.0000 | ORAL_TABLET | Freq: Every day | ORAL | Status: DC | PRN

## 2010-11-30 MED ORDER — BUDESONIDE-FORMOTEROL FUMARATE 160-4.5 MCG/ACT IN AERO
2.0000 | INHALATION_SPRAY | Freq: Two times a day (BID) | RESPIRATORY_TRACT | Status: DC
  Administered 2010-11-30 (×2): 2 via RESPIRATORY_TRACT
  Filled 2010-11-30: qty 6

## 2010-11-30 MED ORDER — PREDNISONE 20 MG PO TABS
60.0000 mg | ORAL_TABLET | Freq: Every day | ORAL | Status: DC
  Administered 2010-12-01 – 2010-12-03 (×3): 60 mg via ORAL
  Filled 2010-11-30 (×3): qty 3

## 2010-11-30 MED ORDER — PANTOPRAZOLE SODIUM 40 MG PO TBEC
40.0000 mg | DELAYED_RELEASE_TABLET | Freq: Every day | ORAL | Status: DC
  Administered 2010-11-30 – 2010-12-06 (×7): 40 mg via ORAL
  Filled 2010-11-30 (×7): qty 1

## 2010-11-30 MED ORDER — METHYLPREDNISOLONE SODIUM SUCC 125 MG IJ SOLR
80.0000 mg | Freq: Four times a day (QID) | INTRAMUSCULAR | Status: DC
  Administered 2010-11-30 (×2): 81.25 mg via INTRAVENOUS
  Filled 2010-11-30 (×2): qty 2

## 2010-11-30 MED ORDER — ACETAMINOPHEN 325 MG PO TABS
650.0000 mg | ORAL_TABLET | Freq: Four times a day (QID) | ORAL | Status: DC | PRN
  Administered 2010-12-01 – 2010-12-02 (×3): 650 mg via ORAL
  Filled 2010-11-30: qty 2
  Filled 2010-11-30 (×2): qty 1

## 2010-11-30 MED ORDER — CHLORHEXIDINE GLUCONATE CLOTH 2 % EX PADS
6.0000 | MEDICATED_PAD | Freq: Every day | CUTANEOUS | Status: AC
  Administered 2010-12-01 – 2010-12-05 (×5): 6 via TOPICAL

## 2010-11-30 MED ORDER — SODIUM CHLORIDE 0.9 % IV SOLN
INTRAVENOUS | Status: DC
  Administered 2010-11-30: 02:00:00 via INTRAVENOUS
  Administered 2010-11-30: 75 mL via INTRAVENOUS

## 2010-11-30 NOTE — Plan of Care (Signed)
Problem: Phase I Progression Outcomes Goal: O2 sats > or equal 90% or at baseline Outcome: Progressing Baseline at home is 2-3L [place].  Here she is 96% on 3L with dyspnea on exertion Goal: Pain controlled Outcome: Completed/Met Date Met:  11/30/10 Chronic issue, no big complaints from patient concerning pain Goal: Progress activity as tolerated unless otherwise ordered Outcome: Completed/Met Date Met:  11/30/10 OOB with minimal assistance to [facility] Goal: Tolerating diet Outcome: Not Progressing N/V/D today

## 2010-11-30 NOTE — Progress Notes (Signed)
Nutrition Pt nutrition risk screen- Dysphagia trigger noted. She is tol Heart Healthy diet with thin liquids. No additional follow-up indicated at this time.

## 2010-11-30 NOTE — Progress Notes (Signed)
Patient has had no bm tonight after several bouts of diarrhea on day shift.  However Nausea and small amounts of  emesis, clear with green tinge.  Patient informed me of ledge that holds food in her esophagus.  Was told after a barium swallow.   Small amounts of chopped cabbage noted in emesis. Patient stated she thought the cole slaw tasted bad at lunch and stopped eating it. MD on call notified of that.  Also informed AC.  Zofran given for nausea.  Will monitor closely

## 2010-11-30 NOTE — Progress Notes (Signed)
Inpatient Diabetes Program Recommendations  AACE/ADA: New Consensus Statement on Inpatient Glycemic Control (2009)  Target Ranges:  Prepandial:   less than 140 mg/dL      Peak postprandial:   less than 180 mg/dL (1-2 hours)      Critically ill patients:  140 - 180 mg/dL   Reason for Visit: History of Diabetes and currently on steroids:  Glucose:  252 mg/dL this am  Inpatient Diabetes Program Recommendations Correction (SSI): Add Novolog Correction

## 2010-11-30 NOTE — Progress Notes (Signed)
Subjective: Patient relates her breathing is better she sitting comfortably in bed. He also breakfast today. She's not using accessory muscles to breathe. She relates she wants to try walking today. Chest pain is much improved. It is probably secondary to coughing. She continues to cough and Objective: Filed Vitals:   11/30/10 0409 11/30/10 0500 11/30/10 0600 11/30/10 0716  BP:  135/75 146/80   Pulse:  106 122   Temp:      TempSrc:      Resp:  21 20   Height:      Weight:      SpO2: 95% 96% 92% 97%   Weight change:   Intake/Output Summary (Last 24 hours) at 11/30/10 0813 Last data filed at 11/30/10 0743  Gross per 24 hour  Intake 546.25 ml  Output   1400 ml  Net -853.75 ml    General: Alert, awake, oriented x3, in no acute distress. Obese. HEENT: No bruits, no goiter.  Heart: Regular rate and rhythm, without murmurs, rubs, gallops.  Lungs: Moderate air movement bilaterally. She has some rhonchi on the left side. Abdomen: Soft, nontender, nondistended, positive bowel sounds.  Neuro: Grossly intact, nonfocal.   Lab Results:  [facility] 11/30/10 0627 11/29/10 2022  NA 122* 115*  K 5.6* 5.1  CL 82* 76*  CO2 30 33*  GLUCOSE 252* 132*  BUN 12 14  CREATININE 0.55 0.71  CALCIUM 8.6 8.9  MG -- --  PHOS -- --    Basename 11/30/10 0627  AST 47*  ALT 53*  ALKPHOS 66  BILITOT 0.4  PROT 6.5  ALBUMIN 3.3*     Basename 11/30/10 0627 11/29/10 2022  WBC 16.3* 19.4*  NEUTROABS -- 15.0*  HGB 12.7 12.5  HCT 35.9* 37.5  MCV 90.7 91.5  PLT 356 404*    Basename 11/30/10 0634 11/29/10 2022  CKTOTAL 327* --  CKMB 18.4* --  CKMBINDEX -- --  TROPONINI <0.30 <0.30    Basename 11/29/10 2022  POCBNP 147.8*    Micro Results: Recent Results (from the past 240 hour(s))  MRSA PCR SCREENING     Status: Abnormal   Collection Time   11/30/10 12:22 AM      Component Value Range Status Comment   MRSA by PCR POSITIVE (*) NEGATIVE  Final     Studies/Results: Dg Chest  Portable 1 View  11/29/2010  *RADIOLOGY REPORT*  Clinical Data: Shortness of breath.  PORTABLE CHEST - 1 VIEW  Comparison: 03/18/2008.  IMPRESSION: COPD, no active infiltrates.  Original Report Authenticated By: Elsie Stain, M.D.    Medications: I have reviewed the patient's current medications.   Patient Active Hospital Problem List:  1. Respiratory failure with hypoxia (11/30/2010)  continues to improve. She is satting 97% on 4 L. Okay to go to regular floor   2.COPD with exacerbation (11/29/2010)  on antibiotics IV steroids and inhaler. Currently stable not using accessory muscles. Reading 97% on 4 L. Patient is okay to go to the regular floor.  3.Hyponatremia (11/29/2010) Probably secondary to dehydration. Her creatinine has improved significantly. Her sodium continues to improve, and her bicarbonate is down. Her labs are pending (urine sodium urine creatinine and urine osmolarity.)  4.Back pain, chronic (11/29/2010)  no complaints stable Hypertension (11/29/2010)   5.Leukocytosis (11/29/2010) Improved. Probably secondary to infectious etiology. Question of a pneumonia can not be seen in the chest x-ray secondary to dehydration. We'll now she's on IV fluids. We'll repeat a chest x-ray in the morning.  6.Oral candidiasis (11/29/2010)  continue nystatin no complaints   LOS: 1 day   FELIZ ORTIZ, ABRAHAM 11/30/2010, 8:13 AM

## 2010-11-30 NOTE — Progress Notes (Signed)
CRITICAL VALUE ALERT  Critical value received:  Ckmb=18.4 & MRSA + Date of notification: [id number]  Time of notification: 0715 Critical value read back:yes  Nurse who received alert: Robyne Peers MD notified (1st page):DR Lendell Caprice Time of first page: 07:35  MD notified (2nd page):  Time of second page:  Responding MD:    Time MD responded:

## 2010-11-30 NOTE — Progress Notes (Signed)
UR chart review completed.  

## 2010-12-01 DIAGNOSIS — R197 Diarrhea, unspecified: Secondary | ICD-10-CM

## 2010-12-01 LAB — CORTISOL-AM, BLOOD: Cortisol - AM: 10.1 ug/dL (ref 4.3–22.4)

## 2010-12-01 LAB — CREATININE, URINE, RANDOM: Creatinine, Urine: 105.77 mg/dL

## 2010-12-01 MED ORDER — METHYLPREDNISOLONE SODIUM SUCC 125 MG IJ SOLR
125.0000 mg | INTRAMUSCULAR | Status: AC
  Administered 2010-12-01: 125 mg via INTRAVENOUS
  Filled 2010-12-01: qty 2

## 2010-12-01 MED ORDER — SODIUM CHLORIDE 0.9 % IV SOLN
INTRAVENOUS | Status: DC
  Administered 2010-12-01: 1000 mL via INTRAVENOUS
  Administered 2010-12-02: 09:00:00 via INTRAVENOUS

## 2010-12-01 MED ORDER — ALUM & MAG HYDROXIDE-SIMETH 200-200-20 MG/5ML PO SUSP
30.0000 mL | Freq: Four times a day (QID) | ORAL | Status: DC | PRN
  Administered 2010-12-01: 30 mL via ORAL
  Filled 2010-12-01: qty 30

## 2010-12-01 MED ORDER — SODIUM CHLORIDE 0.9 % IJ SOLN
INTRAMUSCULAR | Status: AC
  Filled 2010-12-01: qty 20

## 2010-12-01 MED ORDER — SODIUM CHLORIDE 0.9 % IV BOLUS (SEPSIS)
500.0000 mL | Freq: Once | INTRAVENOUS | Status: AC
  Administered 2010-12-01: 500 mL via INTRAVENOUS

## 2010-12-01 MED ORDER — SODIUM CHLORIDE 0.9 % IJ SOLN
INTRAMUSCULAR | Status: AC
Start: 2010-12-01 — End: 2010-12-02
  Filled 2010-12-01: qty 20

## 2010-12-01 MED ORDER — BUDESONIDE-FORMOTEROL FUMARATE 160-4.5 MCG/ACT IN AERO
2.0000 | INHALATION_SPRAY | Freq: Two times a day (BID) | RESPIRATORY_TRACT | Status: DC
  Administered 2010-12-01 – 2010-12-05 (×9): 2 via RESPIRATORY_TRACT
  Filled 2010-12-01: qty 6

## 2010-12-01 NOTE — Progress Notes (Signed)
At 0345, patient BP checked Q4hrs, had dropped to 80/45, continued to check Q15 min. Remains low.  Notified MD orders written.  Also notified MD that patient has not voided tonight.  Bladder scan revealed 230cc in bladder, patient up to [facility] x2 to try to void before scan, patient oral mucosa dry.  Requesting water.  Lisinopril 20mg  given at bedtime BP was normal at that time. Multiple bouts of emesis in the evening.  4 bouts of diarrhea during the day shift.  2100cc UOP from 0700-1300 yesterday

## 2010-12-01 NOTE — Progress Notes (Addendum)
Subjective: Patient relates her breathing is better she sitting comfortably in bed. He also breakfast today. She's not using accessory muscles to breathe. She relates she wants to try walking today. Her costal rib chest pain is bothering her every time she coughs. Objective: Filed Vitals:   12/01/10 0530 12/01/10 0600 12/01/10 0700 12/01/10 0805  BP: 85/46  104/47 100/61  Pulse: 108  106 114  Temp:      TempSrc:      Resp: 15 18 16 19   Height:      Weight:      SpO2: 96%  95% 94%   Weight change:   Intake/Output Summary (Last 24 hours) at 12/01/10 0816 Last data filed at 12/01/10 0529  Gross per 24 hour  Intake   1739 ml  Output   1804 ml  Net    -65 ml    General: Alert, awake, oriented x3, in no acute distress. Obese. HEENT: No bruits, no goiter.  Heart: Regular rate and rhythm, without murmurs, rubs, gallops.  Lungs: Moderate air movement bilaterally. She has some wheezing bilaterally. The patient air movement has not improved compared to yesterday. Abdomen: Soft, nontender, nondistended, positive bowel sounds.  Neuro: Grossly intact, nonfocal.   Lab Results:  [facility] 11/30/10 2236 11/30/10 0627  NA 122* 122*  K 4.5 5.6*  CL 80* 82*  CO2 35* 30  GLUCOSE 190* 252*  BUN 19 12  CREATININE 0.74 0.55  CALCIUM 8.9 8.6  MG -- --  PHOS -- --    Basename 11/30/10 0627  AST 47*  ALT 53*  ALKPHOS 66  BILITOT 0.4  PROT 6.5  ALBUMIN 3.3*     Basename 11/30/10 0627 11/29/10 2022  WBC 16.3* 19.4*  NEUTROABS -- 15.0*  HGB 12.7 12.5  HCT 35.9* 37.5  MCV 90.7 91.5  PLT 356 404*    Basename 11/30/10 2236 11/30/10 1532 11/30/10 0634  CKTOTAL 191* 245* 327*  CKMB 12.2* 14.1* 18.4*  CKMBINDEX -- -- --  TROPONINI <0.30 <0.30 <0.30    Basename 11/29/10 2022  POCBNP 147.8*    Micro Results: Recent Results (from the past 240 hour(s))  MRSA PCR SCREENING     Status: Abnormal   Collection Time   11/30/10 12:22 AM      Component Value Range Status Comment   MRSA by PCR POSITIVE (*) NEGATIVE  Final     Studies/Results: Dg Chest Portable 1 View  11/29/2010  *RADIOLOGY REPORT*  Clinical Data: Shortness of breath.  PORTABLE CHEST - 1 VIEW  Comparison: 03/18/2008.  IMPRESSION: COPD, no active infiltrates.  Original Report Authenticated By: Elsie Stain, M.D.    Medications: I have reviewed the patient's current medications.   Patient Active Hospital Problem List:  1. Respiratory failure with hypoxia (11/30/2010)  continues to improve. She is satting 97% on 4 L.    2.COPD with exacerbation (11/29/2010)  on antibiotics oral steroids and inhaler. Currently stable not using accessory muscles. Reading 97% on 4 L. Patient is okay to go to the regular floor. We'll change steroids to IV for 24 hours and switch her back to PHOs  3.Hyponatremia (11/29/2010) Probably secondary to dehydration. Her creatinine has improved significantly. Her IV got infiltrated patient did not receive significant fluids. Her cortical level in the morning was 10. Fractional excretion of sodium is less than 1 so this probably secondary to depletion of intervascular volume. Will continue IV fluids.  4.Back pain, chronic (11/29/2010)  complaining of back pain today. Get a PT  consult for ambulation.  5.Hypertension (11/29/2010)   5.Leukocytosis (11/29/2010) Improved.   6.Oral candidiasis (11/29/2010)  continue nystatin no complaints. No dysphagia.  7. diarrhea: 4 bowel movements overnight she did PCR pending.   LOS: 2 days   FELIZ ORTIZ, ABRAHAM 12/01/2010, 8:16 AM

## 2010-12-02 DIAGNOSIS — R000 Tachycardia, unspecified: Secondary | ICD-10-CM | POA: Clinically undetermined

## 2010-12-02 LAB — COMPREHENSIVE METABOLIC PANEL
AST: 39 U/L — ABNORMAL HIGH (ref 0–37)
Albumin: 2.8 g/dL — ABNORMAL LOW (ref 3.5–5.2)
BUN: 15 mg/dL (ref 6–23)
Creatinine, Ser: 0.72 mg/dL (ref 0.50–1.10)
Potassium: 4.9 mEq/L (ref 3.5–5.1)
Total Protein: 5.2 g/dL — ABNORMAL LOW (ref 6.0–8.3)

## 2010-12-02 NOTE — Progress Notes (Signed)
  Subjective: Patient seen and examined she reported improvement in her breathing and resolution of her diarrhea.  Objective: Vital signs in last 24 hours: Temp:  [97.5 F (36.4 C)-97.8 F (36.6 C)] 97.5 F (36.4 C) (10/28 0600) Pulse Rate:  [108-124] 108  (10/28 0809) Resp:  [15-23] 20  (10/28 0809) BP: (107-138)/(67-77) 127/73 mmHg (10/28 0600) SpO2:  [89 %-96 %] 96 % (10/28 0552) Weight change:  Last BM Date: 12/01/10  Intake/Output from previous day: 10/27 0701 - 10/28 0700 In: 2446 [P.O.:1230; I.V.:1210; IV Piggyback:6] Out: 2000 [Urine:2000]     Physical Exam: General: Alert, awake, oriented x3, in no acute distress. HEENT: No bruits, no goiter. Heart: Regular rate and rhythm, without murmurs, rubs, gallops. Lungs: Scattered wheezing noted, no rales Abdomen: Soft, nontender, nondistended, positive bowel sounds. Extremities: No clubbing cyanosis or edema with positive pedal pulses.     Lab Results:  Results for orders placed during the hospital encounter of 11/29/10 (from the past 24 hour(s))  COMPREHENSIVE METABOLIC PANEL     Status: Abnormal   Collection Time   12/02/10  5:14 AM      Component Value Range   Sodium 130 (*) 135 - 145 (mEq/L)   Potassium 4.9  3.5 - 5.1 (mEq/L)   Chloride 92 (*) 96 - 112 (mEq/L)   CO2 32  19 - 32 (mEq/L)   Glucose, Bld 145 (*) 70 - 99 (mg/dL)   BUN 15  6 - 23 (mg/dL)   Creatinine, Ser 4.54  0.50 - 1.10 (mg/dL)   Calcium 8.8  8.4 - 09.8 (mg/dL)   Total Protein 5.2 (*) 6.0 - 8.3 (g/dL)   Albumin 2.8 (*) 3.5 - 5.2 (g/dL)   AST 39 (*) 0 - 37 (U/L)   ALT 61 (*) 0 - 35 (U/L)   Alkaline Phosphatase 54  39 - 117 (U/L)   Total Bilirubin 0.3  0.3 - 1.2 (mg/dL)   GFR calc non Af Amer 88 (*) >90 (mL/min)   GFR calc Af Amer >90  >90 (mL/min)   Recent Results (from the past 240 hour(s))  MRSA PCR SCREENING     Status: Abnormal   Collection Time   11/30/10 12:22 AM      Component Value Range Status Comment   MRSA by PCR POSITIVE (*)  NEGATIVE  Final     Studies/Results: No results found.  Medications: Scheduled Meds: Continuous Infusions:   . sodium chloride 1,000 mL (12/01/10 0052)   PRN Meds:.acetaminophen, acetaminophen, alum & mag hydroxide-simeth, diazepam, levalbuterol, morphine, ondansetron (ZOFRAN) IV, ondansetron, senna  Assessment/Plan:  Principal Problem:  *COPD with exacerbation -  Improving ,continue nebs ,steroids and Avelox ,o2 sat around baseline.  Active Problems:  Hyponatremia: Probably multifactorial , improving  Back pain, chronic, continue Percocet. PT consult pending.  Hypertension,controlled  Oral candidiasis-continue nystatin  Transaminitis  stable,  Diarrhea ,resolved unlikley Cdiff  Tachycardia-sinus probably secondary to nebulizers    LOS: 3 days   [name] 12/02/2010, 9:01 AM

## 2010-12-03 LAB — COMPREHENSIVE METABOLIC PANEL
ALT: 48 U/L — ABNORMAL HIGH (ref 0–35)
Alkaline Phosphatase: 47 U/L (ref 39–117)
BUN: 14 mg/dL (ref 6–23)
CO2: 39 mEq/L — ABNORMAL HIGH (ref 19–32)
GFR calc Af Amer: >90 mL/min (ref >90)
GFR calc non Af Amer: >90 mL/min (ref >90)
Glucose, Bld: 144 mg/dL — ABNORMAL HIGH (ref 70–99)
Potassium: 4.4 mEq/L (ref 3.5–5.1)
Sodium: 135 mEq/L (ref 135–145)
Total Bilirubin: 0.1 mg/dL — ABNORMAL LOW (ref 0.3–1.2)

## 2010-12-03 LAB — CBC
MCHC: 32.9 g/dL (ref 30.0–36.0)
Platelets: 314 10*3/uL (ref 150–400)
RDW: 13.4 % (ref 11.5–15.5)

## 2010-12-03 LAB — T4, FREE: Free T4: 0.92 ng/dL (ref 0.80–1.80)

## 2010-12-03 LAB — T3, FREE: T3, Free: 1.9 pg/mL — ABNORMAL LOW (ref 2.3–4.2)

## 2010-12-03 MED ORDER — SODIUM CHLORIDE 0.9 % IJ SOLN
INTRAMUSCULAR | Status: AC
  Filled 2010-12-03: qty 10

## 2010-12-03 MED ORDER — IPRATROPIUM BROMIDE 0.02 % IN SOLN
0.5000 mg | Freq: Four times a day (QID) | RESPIRATORY_TRACT | Status: DC
  Administered 2010-12-03 – 2010-12-06 (×14): 0.5 mg via RESPIRATORY_TRACT
  Filled 2010-12-03 (×16): qty 2.5

## 2010-12-03 MED ORDER — PREDNISONE 20 MG PO TABS
40.0000 mg | ORAL_TABLET | Freq: Every day | ORAL | Status: DC
  Administered 2010-12-04 – 2010-12-06 (×3): 40 mg via ORAL
  Filled 2010-12-03 (×3): qty 2

## 2010-12-03 MED ORDER — LEVALBUTEROL HCL 0.63 MG/3ML IN NEBU
0.6300 mg | INHALATION_SOLUTION | Freq: Four times a day (QID) | RESPIRATORY_TRACT | Status: DC
  Administered 2010-12-03 – 2010-12-06 (×15): 0.63 mg via RESPIRATORY_TRACT
  Filled 2010-12-03 (×15): qty 3

## 2010-12-03 MED ORDER — MORPHINE SULFATE 2 MG/ML IJ SOLN
2.0000 mg | Freq: Four times a day (QID) | INTRAMUSCULAR | Status: DC
  Administered 2010-12-03 – 2010-12-04 (×2): 2 mg via INTRAVENOUS
  Filled 2010-12-03 (×2): qty 1

## 2010-12-03 NOTE — Progress Notes (Signed)
Subjective: Patient seen and examined. Overall improving however still having  difficulty breathing and speaks in short sentences .continued to have occasional lower back pain .  Objective: Vital signs in last 24 hours: Temp:  [97.3 F (36.3 C)-98.4 F (36.9 C)] 97.3 F (36.3 C) (10/29 0601) Pulse Rate:  [91-106] 91  (10/29 0601) Resp:  [20-21] 20  (10/29 0601) BP: (136-160)/(75-85) 136/75 mmHg (10/29 0601) SpO2:  [93 %-98 %] 95 % (10/29 1221) Weight change:  Last BM Date: 12/02/10  Intake/Output from previous day: 10/28 0701 - 10/29 0700 In: 480 [P.O.:480] Out: 2100 [Urine:2100]     Physical Exam: General: Alert, awake, oriented x3, in mild  distress. Heart: Regular rate and rhythm, without murmurs, rubs, gallops. Lungs: decreased air entery bilaterally .no wheezing or rales. Abdomen: Soft, nontender, nondistended, positive bowel sounds. Extremities: No clubbing cyanosis or edema with positive pedal pulses. Neuro: Grossly intact, nonfocal.moves all extremities ,intact sensation.    Lab Results: Results for orders placed during the hospital encounter of 11/29/10 (from the past 24 hour(s))  COMPREHENSIVE METABOLIC PANEL     Status: Abnormal   Collection Time   12/03/10  4:33 AM      Component Value Range   Sodium 135  135 - 145 (mEq/L)   Potassium 4.4  3.5 - 5.1 (mEq/L)   Chloride 94 (*) 96 - 112 (mEq/L)   CO2 39 (*) 19 - 32 (mEq/L)   Glucose, Bld 144 (*) 70 - 99 (mg/dL)   BUN 14  6 - 23 (mg/dL)   Creatinine, Ser 2.13  0.50 - 1.10 (mg/dL)   Calcium 8.9  8.4 - 08.6 (mg/dL)   Total Protein 5.2 (*) 6.0 - 8.3 (g/dL)   Albumin 2.7 (*) 3.5 - 5.2 (g/dL)   AST 26  0 - 37 (U/L)   ALT 48 (*) 0 - 35 (U/L)   Alkaline Phosphatase 47  39 - 117 (U/L)   Total Bilirubin 0.1 (*) 0.3 - 1.2 (mg/dL)   GFR calc non Af Amer >90  >90 (mL/min)   GFR calc Af Amer >90  >90 (mL/min)  CBC     Status: Abnormal   Collection Time   12/03/10  4:33 AM      Component Value Range   WBC 11.2  (*) 4.0 - 10.5 (K/uL)   RBC 3.36 (*) 3.87 - 5.11 (MIL/uL)   Hemoglobin 10.5 (*) 12.0 - 15.0 (g/dL)   HCT 57.8 (*) 46.9 - 46.0 (%)   MCV 94.9  78.0 - 100.0 (fL)   MCH 31.3  26.0 - 34.0 (pg)   MCHC 32.9  30.0 - 36.0 (g/dL)   RDW 62.9  52.8 - 41.3 (%)   Platelets 314  150 - 400 (K/uL)    Recent Results (from the past 240 hour(s))  MRSA PCR SCREENING     Status: Abnormal   Collection Time   11/30/10 12:22 AM      Component Value Range Status Comment   MRSA by PCR POSITIVE (*) NEGATIVE  Final     Studies/Results: No results found.  Medications:    . budesonide-formoterol  2 puff Inhalation BID  . Chlorhexidine Gluconate Cloth  6 each Topical Q0600  . cyclobenzaprine  10 mg Oral Q8H  . docusate sodium  100 mg Oral BID  . enoxaparin  40 mg Subcutaneous Q24H  . FLUoxetine  60 mg Oral Daily  . ipratropium  0.5 mg Nebulization QID  . levalbuterol  0.63 mg Nebulization QID  . lisinopril  20 mg Oral QHS  . moxifloxacin  400 mg Intravenous Q24H  . mupirocin   Nasal BID  . nystatin  10 mL Oral QID  . OLANZapine  10 mg Oral QHS  . oxyCODONE-acetaminophen  1 tablet Oral QID   And  . oxyCODONE  5 mg Oral QID  . pantoprazole  40 mg Oral Q1200  . predniSONE  60 mg Oral QAC breakfast  . DISCONTD: ipratropium  0.5 mg Nebulization Q4H  . DISCONTD: levalbuterol  0.63 mg Nebulization Q4H    acetaminophen, acetaminophen, alum & mag hydroxide-simeth, diazepam, levalbuterol, morphine, ondansetron (ZOFRAN) IV, ondansetron, senna     . DISCONTD: sodium chloride 75 mL/hr at 12/02/10 1610    Assessment/Plan: Principal Problem:  *Advanced COPD with exacerbation -  slowly improving ,continue nebs ,steroids(taper down) and Avelox  Requiring 4 litre of oxygen(baseline) Active Problems:  Hyponatremia:  Resolved Back pain, chronic  continue Percocet. Taper down IV morphine. Continue PT neurological exam intact  Hypertension ,controlled  Oral candidiasis continue nystatin   Diarrhea  ,resolved unlikley Cdiff . Tachycardia-sinus  probably secondary to nebulizers,improved.      LOS: 4 days   [name] 12/03/2010, 2:04 PM

## 2010-12-03 NOTE — Progress Notes (Signed)
Pt transferred to ICU-01 as overflow patient, reported to M.Delancey, RN, pt condition stable with oxygen @ 4L/M during transport.

## 2010-12-03 NOTE — Progress Notes (Signed)
Physical Therapy Evaluation Patient Details Name: [name] MRN: [id number] DOB: 11-05-1945 Today's Date: 12/03/2010  Problem List:  Patient Active Problem List  Diagnoses  . COPD with exacerbation  . Hyponatremia  . Back pain, chronic  . Hypertension  . Oral candidiasis  . Transaminitis  . Diarrhea  . Tachycardia    Past Medical History:  Past Medical History  Diagnosis Date  . GERD (gastroesophageal reflux disease)   . Allergic rhinitis   . Pneumonia   . Hypertension   . Diabetes mellitus, type 2   . 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 Duodenal ulcer   . Hiatal hernia   . Diverticulosis   . IBS (irritable bowel syndrome)   . Diverticulitis 2001  . Anxiety   . Chronic airway obstruction, not elsewhere classified   . 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 CHF (congestive heart failure)    Past Surgical History:  Past Surgical History  Procedure Date  . Cesarean section 1971  . Abdominal hysterectomy 1980  . Colectomy 07/1999    Sigmoid  . Rod in left leg   . Cholecystectomy     ERCP sphincterotomy, stone extraction 2006    PT Assessment/Plan/Recommendation PT Assessment Clinical Impression Statement: pt is very cooperative, has chronic lumbar pain (for which she has tried to get relief from all known means), and has very limited mobility at home due to pulmonary disease...she is very close to prior functional level, but wouild benefit from having  a 4 wheeled walker to improve safety and decrease exertion with gait...she also needs to have her bedroom at home relocated from 2nd to 1st floor (where it used to be).Marland KitchenMarland KitchenI am not sure that HHPT would be helpful for this pt PT Recommendation/Assessment: Patient will need skilled PT in the acute care venue PT Problem List: Decreased knowledge of use of DME;Decreased mobility;Decreased activity tolerance Barriers to Discharge: None PT Therapy  Diagnosis : Difficulty walking PT Plan PT Frequency: Min 2X/week PT Treatment/Interventions: DME instruction;Gait training PT Recommendation Follow Up Recommendations: None Equipment Recommended: Other (comment) (rollator walker (4 wheels with hand brakes) PT Goals  Acute Rehab PT Goals PT Goal Formulation: With patient Time For Goal Achievement: 4 days Pt will Ambulate: 16 - 50 feet;with rolling walker;with modified independence  PT Evaluation Precautions/Restrictions  Precautions Required Braces or Orthoses: No Restrictions Weight Bearing Restrictions: No Prior Functioning  Home Living Lives With: Spouse Receives Help From: Family Type of Home: House Home Layout: Two level;Able to live on main level with bedroom/bathroom Alternate Level Stairs-Rails: Right Alternate Level Stairs-Number of Steps: full flight to 2nd floor Home Access: Stairs to enter Entrance Stairs-Rails: Right Entrance Stairs-Number of Steps: 4 Bathroom Shower/Tub: Engineer, manufacturing systems: Standard Bathroom Accessibility: Yes How Accessible: Accessible via walker Home Adaptive Equipment: None Prior Function Level of Independence: Needs assistance with ADLs;Independent with gait;Independent with transfers Bath: Moderate Dressing: Minimal Driving: No Vocation: Retired Producer, television/film/video: Awake/alert Overall Cognitive Status: Appears within functional limits for tasks assessed Sensation/Coordination Sensation Light Touch: Appears Intact Stereognosis: Not tested Hot/Cold: Not tested Proprioception: Not tested Coordination Gross Motor Movements are Fluid and Coordinated: Yes Fine Motor Movements are Fluid and Coordinated: Yes Extremity Assessment RUE Assessment RUE Assessment: Within Functional Limits LUE Assessment LUE Assessment: Within Functional Limits RLE Assessment RLE Assessment: Within Functional Limits LLE Assessment LLE Assessment: Within Functional  Limits Mobility (including Balance) Bed Mobility Bed  Mobility: Yes Supine to Sit: 7: Independent Sitting - Scoot to Edge of Bed: 7: Independent Transfers Transfers: Yes Sit to Stand: 7: Independent Stand to Sit: 7: Independent Stand Pivot Transfers: 7: Independent Ambulation/Gait Ambulation/Gait: Yes Ambulation/Gait Assistance: 5: Supervision Ambulation/Gait Assistance Details (indicate cue type and reason): occasionally has a mild LOB but able to self correct Ambulation Distance (Feet): 40 Feet Assistive device: Rolling walker (pt is able to walk unassisted, but safer with walker) Gait Pattern: Within Functional Limits;Decreased dorsiflexion - left Stairs: No Wheelchair Mobility Wheelchair Mobility: No  Posture/Postural Control Posture/Postural Control: Postural limitations Postural Limitations: appears to have a mild scoliosis Balance Balance Assessed: Yes Dynamic Sitting Balance Dynamic Sitting - Level of Assistance: 7: Independent Static Standing Balance Static Standing - Level of Assistance: 7: Independent Dynamic Standing Balance Dynamic Standing - Level of Assistance: 5: Stand by assistance Exercise    End of Session PT - End of Session Equipment Utilized During Treatment: Gait belt Activity Tolerance: Patient tolerated treatment well;Patient limited by fatigue Patient left: in bed;with call bell in reach Nurse Communication: Mobility status for transfers;Mobility status for ambulation General Behavior During Session: [facility] for tasks performed Cognition: [facility] for tasks performed  Konrad Penta 12/03/2010, 12:02 PM

## 2010-12-04 LAB — COMPREHENSIVE METABOLIC PANEL
Albumin: 2.9 g/dL — ABNORMAL LOW (ref 3.5–5.2)
BUN: 12 mg/dL (ref 6–23)
Calcium: 9 mg/dL (ref 8.4–10.5)
Creatinine, Ser: 0.68 mg/dL (ref 0.50–1.10)
GFR calc Af Amer: >90 mL/min (ref >90)
Glucose, Bld: 88 mg/dL (ref 70–99)
Potassium: 4.4 mEq/L (ref 3.5–5.1)
Total Protein: 5.4 g/dL — ABNORMAL LOW (ref 6.0–8.3)

## 2010-12-04 LAB — BLOOD GAS, ARTERIAL
O2 Content: 4 L/min
pCO2 arterial: 68 mmHg (ref 35.0–45.0)
pH, Arterial: 7.373 (ref 7.350–7.400)
pO2, Arterial: 76 mmHg — ABNORMAL LOW (ref 80.0–100.0)

## 2010-12-04 LAB — CBC
HCT: 33 % — ABNORMAL LOW (ref 36.0–46.0)
MCHC: 32.7 g/dL (ref 30.0–36.0)
Platelets: 313 10*3/uL (ref 150–400)
RDW: 13.1 % (ref 11.5–15.5)

## 2010-12-04 MED ORDER — MORPHINE SULFATE 2 MG/ML IJ SOLN
2.0000 mg | Freq: Two times a day (BID) | INTRAMUSCULAR | Status: DC | PRN
  Administered 2010-12-04 – 2010-12-06 (×4): 2 mg via INTRAVENOUS
  Filled 2010-12-04 (×5): qty 1

## 2010-12-04 MED ORDER — OXYCODONE-ACETAMINOPHEN 5-325 MG PO TABS
1.0000 | ORAL_TABLET | Freq: Once | ORAL | Status: AC
  Administered 2010-12-04: 1 via ORAL

## 2010-12-04 MED ORDER — SODIUM CHLORIDE 0.9 % IJ SOLN
INTRAMUSCULAR | Status: AC
  Filled 2010-12-04: qty 10

## 2010-12-04 MED ORDER — SODIUM CHLORIDE 0.9 % IJ SOLN
INTRAMUSCULAR | Status: AC
  Filled 2010-12-04: qty 3

## 2010-12-04 MED ORDER — LISINOPRIL 10 MG PO TABS
30.0000 mg | ORAL_TABLET | Freq: Every day | ORAL | Status: DC
  Administered 2010-12-04 – 2010-12-06 (×2): 30 mg via ORAL
  Filled 2010-12-04: qty 3
  Filled 2010-12-04: qty 1

## 2010-12-04 NOTE — Progress Notes (Signed)
Subjective: Patient seen and examined. The complaining of shortness of breath, reported having a rough night  with difficulty breathing.  Objective: Vital signs in last 24 hours: Temp:  [97.7 F (36.5 C)-98.3 F (36.8 C)] 97.7 F (36.5 C) (10/30 0724) Pulse Rate:  [97-104] 97  (10/30 0724) Resp:  [20-22] 20  (10/30 0724) BP: (152-162)/(83-96) 162/96 mmHg (10/30 0600) SpO2:  [94 %-100 %] 97 % (10/30 0724) Weight:  [74.5 kg (164 lb 3.9 oz)] 164 lb 3.9 oz (74.5 kg) (10/30 0500) Weight change:  Last BM Date: 12/02/10  Intake/Output from previous day: 10/29 0701 - 10/30 0700 In: 970 [P.O.:720; IV Piggyback:250] Out: 500 [Urine:500]     Physical Exam: General: Alert, awake, oriented x3, in no acute distress. HEENT: No bruits, no goiter. Heart: Regular rate and rhythm, without murmurs, rubs, gallops. Lungs: scattered expiratory  Wheezes ,decreased air entry. Abdomen: Soft, nontender, nondistended, positive bowel sounds. Extremities: No clubbing cyanosis or edema with positive pedal pulses. Neurological exam ,non focal .    Lab Results: Results for orders placed during the hospital encounter of 11/29/10 (from the past 24 hour(s))  CBC     Status: Abnormal   Collection Time   12/04/10  6:06 AM      Component Value Range   WBC 11.3 (*) 4.0 - 10.5 (K/uL)   RBC 3.46 (*) 3.87 - 5.11 (MIL/uL)   Hemoglobin 10.8 (*) 12.0 - 15.0 (g/dL)   HCT 95.6 (*) 21.3 - 46.0 (%)   MCV 95.4  78.0 - 100.0 (fL)   MCH 31.2  26.0 - 34.0 (pg)   MCHC 32.7  30.0 - 36.0 (g/dL)   RDW 08.6  57.8 - 46.9 (%)   Platelets 313  150 - 400 (K/uL)  COMPREHENSIVE METABOLIC PANEL     Status: Abnormal   Collection Time   12/04/10  6:06 AM      Component Value Range   Sodium 139  135 - 145 (mEq/L)   Potassium 4.4  3.5 - 5.1 (mEq/L)   Chloride 96  96 - 112 (mEq/L)   CO2 41 (*) 19 - 32 (mEq/L)   Glucose, Bld 88  70 - 99 (mg/dL)   BUN 12  6 - 23 (mg/dL)   Creatinine, Ser 6.29  0.50 - 1.10 (mg/dL)   Calcium  9.0  8.4 - 10.5 (mg/dL)   Total Protein 5.4 (*) 6.0 - 8.3 (g/dL)   Albumin 2.9 (*) 3.5 - 5.2 (g/dL)   AST 20  0 - 37 (U/L)   ALT 47 (*) 0 - 35 (U/L)   Alkaline Phosphatase 48  39 - 117 (U/L)   Total Bilirubin 0.2 (*) 0.3 - 1.2 (mg/dL)   GFR calc non Af Amer 90 (*) >90 (mL/min)   GFR calc Af Amer >90  >90 (mL/min)    Recent Results (from the past 240 hour(s))  MRSA PCR SCREENING     Status: Abnormal   Collection Time   11/30/10 12:22 AM      Component Value Range Status Comment   MRSA by PCR POSITIVE (*) NEGATIVE  Final     Studies/Results: No results found.  Medications:    . budesonide-formoterol  2 puff Inhalation BID  . Chlorhexidine Gluconate Cloth  6 each Topical Q0600  . cyclobenzaprine  10 mg Oral Q8H  . docusate sodium  100 mg Oral BID  . enoxaparin  40 mg Subcutaneous Q24H  . FLUoxetine  60 mg Oral Daily  . ipratropium  0.5 mg Nebulization  QID  . levalbuterol  0.63 mg Nebulization QID  . lisinopril  20 mg Oral QHS  . morphine  2 mg Intravenous Q6H  . moxifloxacin  400 mg Intravenous Q24H  . mupirocin   Nasal BID  . nystatin  10 mL Oral QID  . OLANZapine  10 mg Oral QHS  . oxyCODONE-acetaminophen  1 tablet Oral QID   And  . oxyCODONE  5 mg Oral QID  . oxyCODONE-acetaminophen  1 tablet Oral Once  . pantoprazole  40 mg Oral Q1200  . predniSONE  40 mg Oral QAC breakfast  . sodium chloride      . DISCONTD: predniSONE  60 mg Oral QAC breakfast    acetaminophen, acetaminophen, alum & mag hydroxide-simeth, diazepam, levalbuterol, ondansetron (ZOFRAN) IV, ondansetron, senna, DISCONTD: morphine     Assessment/Plan:  *Advanced COPD with exacerbation -  Rising serum bicarbonate implying worsening respiratory acidosis ,will check repeat ABG. continue nebs ,steroids(taper down to 40 mg ) and Avelox . Requiring 4 litre of oxygen(baseline)  Will order Bipap at night. Active Problems:  Back pain, chronic  continue Percocet. Taper down IV morphine to D/C .    Continue PT  neurological exam intact  Hypertension  ,uncontrolled ,increase lisinopril to 30 mg. Oral candidiasis  continue nystatin  Diarrhea , resolved unlikley Cdiff .  Tachycardia-sinus  probably secondary to nebulizers,improved.         LOS: 5 days   [name] 12/04/2010, 9:32 AM

## 2010-12-04 NOTE — Progress Notes (Signed)
Physical Therapy Treatment Patient Details Name: [name] MRN: [id number] DOB: Jun 11, 1945 Today's Date: 12/04/2010  TIME: 1250-1304/ GT  PT Assessment/Plan  PT - Assessment/Plan Comments on Treatment Session: Pt completed  130' of gait training with RW supervision;pt educated on proper RW techniques;pt did very well PT Goals     PT Treatment Precautions/Restrictions  Precautions Required Braces or Orthoses: No Restrictions Weight Bearing Restrictions: No Mobility (including Balance) Bed Mobility Bed Mobility: No Ambulation/Gait Ambulation/Gait: Yes Ambulation/Gait Assistance: 5: Supervision Ambulation/Gait Assistance Details (indicate cue type and reason): RW; education on RW sequencing and use Ambulation Distance (Feet): 130 Feet Assistive device: Rolling walker Gait velocity: slow Stairs: No Wheelchair Mobility Wheelchair Mobility: No    Exercise    End of Session PT - End of Session Equipment Utilized During Treatment: Gait belt (RW) Activity Tolerance: Patient tolerated treatment well Patient left: in bed;with call bell in reach General Behavior During Session: [facility] for tasks performed Cognition: [facility] for tasks performed  Broderick Fonseca ATKINSO 12/04/2010, 1:13 PM

## 2010-12-05 LAB — BASIC METABOLIC PANEL
Calcium: 9.2 mg/dL (ref 8.4–10.5)
GFR calc Af Amer: >90 mL/min (ref >90)
GFR calc non Af Amer: 88 mL/min — ABNORMAL LOW (ref >90)
Potassium: 3.9 mEq/L (ref 3.5–5.1)
Sodium: 140 mEq/L (ref 135–145)

## 2010-12-05 LAB — BLOOD GAS, ARTERIAL
Acid-Base Excess: 10.5 mmol/L — ABNORMAL HIGH (ref 0.0–2.0)
Acid-Base Excess: 10.6 mmol/L — ABNORMAL HIGH (ref 0.0–2.0)
O2 Content: 2 L/min
O2 Saturation: 94.4 %
Patient temperature: 37
TCO2: 33.3 mmol/L (ref 0–100)
TCO2: 33 mmol/L (ref 0–100)
pCO2 arterial: 65.8 mmHg (ref 35.0–45.0)
pCO2 arterial: 62.6 mmHg (ref 35.0–45.0)
pH, Arterial: 7.36 (ref 7.350–7.400)
pO2, Arterial: 73.3 mmHg — ABNORMAL LOW (ref 80.0–100.0)

## 2010-12-05 LAB — GLUCOSE, CAPILLARY: Glucose-Capillary: 177 mg/dL — ABNORMAL HIGH (ref 70–99)

## 2010-12-05 MED ORDER — MOXIFLOXACIN HCL 400 MG PO TABS
400.0000 mg | ORAL_TABLET | Freq: Every day | ORAL | Status: DC
  Filled 2010-12-05: qty 1

## 2010-12-05 NOTE — Significant Event (Signed)
Error

## 2010-12-05 NOTE — Progress Notes (Signed)
Subjective: This lady was admitted with exacerbation of COPD. She feels that she is weak and subjectively feels her breathing is not back to his normal state. She normally has 4 L oxygen per minute at home. She says that she gets dyspneic on their minimal exertion in and I suspect she has end-stage COPD. She quit smoking 4 years ago, having smoked for 47 years.           Physical Exam: Blood pressure 157/76, pulse 104, temperature 97.6 F (36.4 C), temperature source Oral, resp. rate 20, height 5' (1.524 m), weight 74.5 kg (164 lb 3.9 oz), SpO2 98.00%. She does not have increased work of breathing at rest. There is no peripheral or central cyanosis. Lung fields are entirely clear without wheezing although there is poor air entry bilaterally which I think is chronic. Heart sounds are present and normal without murmurs. She is alert and orientated.   Investigations: Results for orders placed during the hospital encounter of 11/29/10 (from the past 48 hour(s))  CBC     Status: Abnormal   Collection Time   12/04/10  6:06 AM      Component Value Range Comment   WBC 11.3 (*) 4.0 - 10.5 (K/uL)    RBC 3.46 (*) 3.87 - 5.11 (MIL/uL)    Hemoglobin 10.8 (*) 12.0 - 15.0 (g/dL)    HCT 16.1 (*) 09.6 - 46.0 (%)    MCV 95.4  78.0 - 100.0 (fL)    MCH 31.2  26.0 - 34.0 (pg)    MCHC 32.7  30.0 - 36.0 (g/dL)    RDW 04.5  40.9 - 81.1 (%)    Platelets 313  150 - 400 (K/uL)   COMPREHENSIVE METABOLIC PANEL     Status: Abnormal   Collection Time   12/04/10  6:06 AM      Component Value Range Comment   Sodium 139  135 - 145 (mEq/L)    Potassium 4.4  3.5 - 5.1 (mEq/L)    Chloride 96  96 - 112 (mEq/L)    CO2 41 (*) 19 - 32 (mEq/L)    Glucose, Bld 88  70 - 99 (mg/dL)    BUN 12  6 - 23 (mg/dL)    Creatinine, Ser 9.14  0.50 - 1.10 (mg/dL)    Calcium 9.0  8.4 - 10.5 (mg/dL)    Total Protein 5.4 (*) 6.0 - 8.3 (g/dL)    Albumin 2.9 (*) 3.5 - 5.2 (g/dL)    AST 20  0 - 37 (U/L)    ALT 47 (*) 0 - 35 (U/L)     Alkaline Phosphatase 48  39 - 117 (U/L)    Total Bilirubin 0.2 (*) 0.3 - 1.2 (mg/dL)    GFR calc non Af Amer 90 (*) >90 (mL/min)    GFR calc Af Amer >90  >90 (mL/min)   BLOOD GAS, ARTERIAL     Status: Abnormal   Collection Time   12/04/10 11:50 AM      Component Value Range Comment   O2 Content 4.0      Delivery systems NASAL CANNULA      pH, Arterial 7.373  7.350 - 7.400     pCO2 arterial 68.0 (*) 35.0 - 45.0 (mmHg)    pO2, Arterial 76.0 (*) 80.0 - 100.0 (mmHg)    Bicarbonate 38.6 (*) 20.0 - 24.0 (mEq/L)    TCO2 35.4  0 - 100 (mmol/L)    Acid-Base Excess 12.9 (*) 0.0 - 2.0 (mmol/L)  O2 Saturation 95.0      Patient temperature 37.0      Collection site RIGHT BRACHIAL      Drawn by COLLECTED BY RT      Sample type ARTERIAL      Allens test (pass/fail) NOT INDICATED (*) PASS    BASIC METABOLIC PANEL     Status: Abnormal   Collection Time   12/05/10  3:57 AM      Component Value Range Comment   Sodium 140  135 - 145 (mEq/L)    Potassium 3.9  3.5 - 5.1 (mEq/L)    Chloride 94 (*) 96 - 112 (mEq/L)    CO2 40 (*) 19 - 32 (mEq/L)    Glucose, Bld 187 (*) 70 - 99 (mg/dL)    BUN 13  6 - 23 (mg/dL)    Creatinine, Ser 2.84  0.50 - 1.10 (mg/dL)    Calcium 9.2  8.4 - 10.5 (mg/dL)    GFR calc non Af Amer 88 (*) >90 (mL/min)    GFR calc Af Amer >90  >90 (mL/min)    Recent Results (from the past 240 hour(s))  MRSA PCR SCREENING     Status: Abnormal   Collection Time   11/30/10 12:22 AM      Component Value Range Status Comment   MRSA by PCR POSITIVE (*) NEGATIVE  Final         Medications: I have reviewed the patient's current medications.  Impression: 1. COPD exacerbation. 2. Hyponatremia, resolved. 3. Hypertension, controlled. 4. Oral candidiasis.     Plan: 1. Continue with current treatment. 2. Repeat ABG as it was showing type II respiratory failure yesterday morning. 3. Hopefully she can be discharged in the next day or 2.     LOS: 6 days   [name]  C 12/05/2010, 8:55 AM

## 2010-12-05 NOTE — Progress Notes (Signed)
CRITICAL VALUE ALERT  Critical value received: CO2 :40  Date of notification: 12/05/2010  Time of notification:  0625  Critical value read back:yes  Nurse who received alert:  Milinda Cave, RN -Primary nurse Z. Lanier Ensign, RN made aware

## 2010-12-06 ENCOUNTER — Encounter (HOSPITAL_COMMUNITY): Payer: Self-pay | Admitting: *Deleted

## 2010-12-06 ENCOUNTER — Emergency Department (HOSPITAL_COMMUNITY)
Admission: EM | Admit: 2010-12-06 | Discharge: 2010-12-07 | Disposition: A | Discharge status: Home / Self Care | Payer: Medicare Other | Attending: Internal Medicine | Admitting: Internal Medicine

## 2010-12-06 ENCOUNTER — Emergency Department (HOSPITAL_COMMUNITY): Payer: Medicare Other

## 2010-12-06 DIAGNOSIS — Z79899 Other long term (current) drug therapy: Secondary | ICD-10-CM | POA: Insufficient documentation

## 2010-12-06 DIAGNOSIS — Z9079 Acquired absence of other genital organ(s): Secondary | ICD-10-CM | POA: Insufficient documentation

## 2010-12-06 DIAGNOSIS — G8929 Other chronic pain: Secondary | ICD-10-CM | POA: Insufficient documentation

## 2010-12-06 DIAGNOSIS — I509 Heart failure, unspecified: Secondary | ICD-10-CM | POA: Insufficient documentation

## 2010-12-06 DIAGNOSIS — R000 Tachycardia, unspecified: Secondary | ICD-10-CM | POA: Diagnosis present

## 2010-12-06 DIAGNOSIS — K219 Gastro-esophageal reflux disease without esophagitis: Secondary | ICD-10-CM | POA: Insufficient documentation

## 2010-12-06 DIAGNOSIS — Z9889 Other specified postprocedural states: Secondary | ICD-10-CM | POA: Insufficient documentation

## 2010-12-06 DIAGNOSIS — Z860100 Personal history of colon polyps, unspecified: Secondary | ICD-10-CM | POA: Insufficient documentation

## 2010-12-06 DIAGNOSIS — J441 Chronic obstructive pulmonary disease with (acute) exacerbation: Secondary | ICD-10-CM | POA: Insufficient documentation

## 2010-12-06 DIAGNOSIS — E119 Type 2 diabetes mellitus without complications: Secondary | ICD-10-CM | POA: Insufficient documentation

## 2010-12-06 DIAGNOSIS — M549 Dorsalgia, unspecified: Secondary | ICD-10-CM | POA: Diagnosis present

## 2010-12-06 DIAGNOSIS — K589 Irritable bowel syndrome without diarrhea: Secondary | ICD-10-CM | POA: Insufficient documentation

## 2010-12-06 DIAGNOSIS — I10 Essential (primary) hypertension: Secondary | ICD-10-CM | POA: Insufficient documentation

## 2010-12-06 DIAGNOSIS — K449 Diaphragmatic hernia without obstruction or gangrene: Secondary | ICD-10-CM | POA: Insufficient documentation

## 2010-12-06 LAB — BLOOD GAS, ARTERIAL
O2 Content: 3 L/min
O2 Saturation: 97.6 %
Patient temperature: 37
pO2, Arterial: 109 mmHg — ABNORMAL HIGH (ref 80.0–100.0)

## 2010-12-06 LAB — CBC
HCT: 34.5 % — ABNORMAL LOW (ref 36.0–46.0)
Hemoglobin: 11.1 g/dL — ABNORMAL LOW (ref 12.0–15.0)
MCH: 31.1 pg (ref 26.0–34.0)
MCH: 31.3 pg (ref 26.0–34.0)
MCV: 96.6 fL (ref 78.0–100.0)
Platelets: 402 10*3/uL — ABNORMAL HIGH (ref 150–400)
RBC: 3.57 MIL/uL — ABNORMAL LOW (ref 3.87–5.11)
RBC: 4.02 MIL/uL (ref 3.87–5.11)
RDW: 13.1 % (ref 11.5–15.5)
WBC: 14.1 10*3/uL — ABNORMAL HIGH (ref 4.0–10.5)
WBC: 18.1 10*3/uL — ABNORMAL HIGH (ref 4.0–10.5)

## 2010-12-06 LAB — COMPREHENSIVE METABOLIC PANEL
AST: 14 U/L (ref 0–37)
BUN: 11 mg/dL (ref 6–23)
CO2: 40 mEq/L (ref 19–32)
Calcium: 9 mg/dL (ref 8.4–10.5)
Chloride: 92 mEq/L — ABNORMAL LOW (ref 96–112)
Creatinine, Ser: 0.68 mg/dL (ref 0.50–1.10)
GFR calc Af Amer: >90 mL/min (ref >90)
GFR calc non Af Amer: 90 mL/min — ABNORMAL LOW (ref >90)
Glucose, Bld: 152 mg/dL — ABNORMAL HIGH (ref 70–99)
Total Bilirubin: 0.3 mg/dL (ref 0.3–1.2)

## 2010-12-06 LAB — BASIC METABOLIC PANEL
CO2: 38 mEq/L — ABNORMAL HIGH (ref 19–32)
Calcium: 9 mg/dL (ref 8.4–10.5)
Chloride: 89 mEq/L — ABNORMAL LOW (ref 96–112)
GFR calc Af Amer: >90 mL/min (ref >90)
Sodium: 133 mEq/L — ABNORMAL LOW (ref 135–145)

## 2010-12-06 LAB — GLUCOSE, CAPILLARY: Glucose-Capillary: 208 mg/dL — ABNORMAL HIGH (ref 70–99)

## 2010-12-06 MED ORDER — IPRATROPIUM BROMIDE 0.02 % IN SOLN
0.5000 mg | Freq: Once | RESPIRATORY_TRACT | Status: AC
  Administered 2010-12-06: 0.5 mg via RESPIRATORY_TRACT
  Filled 2010-12-06: qty 2.5

## 2010-12-06 MED ORDER — SENNA 8.6 MG PO TABS: 2.0000 | ORAL_TABLET | Freq: Every day | ORAL | Status: DC | PRN

## 2010-12-06 MED ORDER — HYDROCHLOROTHIAZIDE 12.5 MG PO CAPS: 12.5000 mg | ORAL_CAPSULE | Freq: Every day | ORAL | Status: DC

## 2010-12-06 MED ORDER — PREDNISONE 20 MG PO TABS: ORAL_TABLET | ORAL | Status: DC

## 2010-12-06 MED ORDER — HEPARIN SODIUM (PORCINE) 5000 UNIT/ML IJ SOLN
5000.0000 [IU] | Freq: Three times a day (TID) | INTRAMUSCULAR | Status: DC
  Administered 2010-12-06 – 2010-12-07 (×2): 5000 [IU] via SUBCUTANEOUS
  Filled 2010-12-06 (×7): qty 1

## 2010-12-06 MED ORDER — ALBUTEROL SULFATE (5 MG/ML) 0.5% IN NEBU: 2.5000 mg | INHALATION_SOLUTION | Freq: Two times a day (BID) | RESPIRATORY_TRACT | Status: DC | PRN

## 2010-12-06 MED ORDER — PANTOPRAZOLE SODIUM 40 MG PO TBEC
DELAYED_RELEASE_TABLET | ORAL | Status: AC
  Filled 2010-12-06: qty 1

## 2010-12-06 MED ORDER — NICOTINE 7 MG/24HR TD PT24
7.0000 mg | MEDICATED_PATCH | Freq: Every day | TRANSDERMAL | Status: DC
  Filled 2010-12-06 (×2): qty 1

## 2010-12-06 MED ORDER — ALBUTEROL SULFATE (5 MG/ML) 0.5% IN NEBU
5.0000 mg | INHALATION_SOLUTION | Freq: Once | RESPIRATORY_TRACT | Status: AC
  Administered 2010-12-06: 5 mg via RESPIRATORY_TRACT
  Filled 2010-12-06: qty 1

## 2010-12-06 MED ORDER — HEPARIN SODIUM (PORCINE) 5000 UNIT/ML IJ SOLN
INTRAMUSCULAR | Status: AC
  Filled 2010-12-06: qty 1

## 2010-12-06 MED ORDER — ACETAMINOPHEN 650 MG RE SUPP: 650.0000 mg | Freq: Four times a day (QID) | RECTAL | Status: DC | PRN

## 2010-12-06 MED ORDER — METHYLPREDNISOLONE SODIUM SUCC 125 MG IJ SOLR
125.0000 mg | Freq: Once | INTRAMUSCULAR | Status: AC
  Administered 2010-12-06: 125 mg via INTRAVENOUS
  Filled 2010-12-06: qty 2

## 2010-12-06 MED ORDER — BUDESONIDE-FORMOTEROL FUMARATE 160-4.5 MCG/ACT IN AERO
2.0000 | INHALATION_SPRAY | Freq: Two times a day (BID) | RESPIRATORY_TRACT | Status: DC
  Administered 2010-12-07: 2 via RESPIRATORY_TRACT

## 2010-12-06 MED ORDER — ACETAMINOPHEN 325 MG PO TABS: 650.0000 mg | ORAL_TABLET | Freq: Four times a day (QID) | ORAL | Status: DC | PRN

## 2010-12-06 MED ORDER — PREDNISONE 20 MG PO TABS
40.0000 mg | ORAL_TABLET | Freq: Every day | ORAL | Status: DC
  Administered 2010-12-06 – 2010-12-07 (×2): 40 mg via ORAL
  Filled 2010-12-06 (×2): qty 2

## 2010-12-06 MED ORDER — MOXIFLOXACIN HCL 400 MG PO TABS: 400.0000 mg | ORAL_TABLET | Freq: Every day | ORAL | Status: DC

## 2010-12-06 MED ORDER — FLUOXETINE HCL 20 MG PO CAPS: 60.0000 mg | ORAL_CAPSULE | Freq: Every day | ORAL | Status: DC

## 2010-12-06 MED ORDER — INSULIN ASPART 100 UNIT/ML ~~LOC~~ SOLN
0.0000 [IU] | Freq: Three times a day (TID) | SUBCUTANEOUS | Status: DC
  Administered 2010-12-07: 3 [IU] via SUBCUTANEOUS

## 2010-12-06 MED ORDER — ONDANSETRON HCL 4 MG/2ML IJ SOLN: 4.0000 mg | Freq: Four times a day (QID) | INTRAMUSCULAR | Status: DC | PRN

## 2010-12-06 MED ORDER — DOCUSATE SODIUM 100 MG PO CAPS: 100.0000 mg | ORAL_CAPSULE | Freq: Two times a day (BID) | ORAL | Status: DC

## 2010-12-06 MED ORDER — MOXIFLOXACIN HCL 400 MG PO TABS: 400.0000 mg | ORAL_TABLET | Freq: Every day | ORAL | Status: AC

## 2010-12-06 MED ORDER — FENTANYL CITRATE 0.05 MG/ML IJ SOLN
50.0000 ug | Freq: Once | INTRAMUSCULAR | Status: AC
  Administered 2010-12-06: 50 ug via INTRAVENOUS
  Filled 2010-12-06: qty 2

## 2010-12-06 MED ORDER — MOXIFLOXACIN HCL 400 MG PO TABS
ORAL_TABLET | ORAL | Status: AC
  Filled 2010-12-06: qty 1

## 2010-12-06 MED ORDER — DIAZEPAM 5 MG PO TABS
10.0000 mg | ORAL_TABLET | Freq: Two times a day (BID) | ORAL | Status: DC | PRN
  Administered 2010-12-06: 10 mg via ORAL
  Filled 2010-12-06: qty 2

## 2010-12-06 MED ORDER — ALBUTEROL SULFATE HFA 108 (90 BASE) MCG/ACT IN AERS: 2.0000 | INHALATION_SPRAY | Freq: Four times a day (QID) | RESPIRATORY_TRACT | Status: DC

## 2010-12-06 MED ORDER — ONDANSETRON HCL 4 MG PO TABS: 4.0000 mg | ORAL_TABLET | Freq: Four times a day (QID) | ORAL | Status: DC | PRN

## 2010-12-06 MED ORDER — LISINOPRIL 10 MG PO TABS: 20.0000 mg | ORAL_TABLET | Freq: Every day | ORAL | Status: DC

## 2010-12-06 MED ORDER — OLANZAPINE 5 MG PO TBDP: 10.0000 mg | ORAL_TABLET | Freq: Every day | ORAL | Status: DC

## 2010-12-06 MED ORDER — ALBUTEROL SULFATE (5 MG/ML) 0.5% IN NEBU
2.5000 mg | INHALATION_SOLUTION | RESPIRATORY_TRACT | Status: DC
  Administered 2010-12-07 (×4): 2.5 mg via RESPIRATORY_TRACT
  Filled 2010-12-06 (×4): qty 0.5

## 2010-12-06 MED ORDER — PANTOPRAZOLE SODIUM 40 MG PO TBEC: 40.0000 mg | DELAYED_RELEASE_TABLET | Freq: Every day | ORAL | Status: DC

## 2010-12-06 MED ORDER — IPRATROPIUM BROMIDE 0.02 % IN SOLN
0.5000 mg | RESPIRATORY_TRACT | Status: DC
  Administered 2010-12-07 (×4): 0.5 mg via RESPIRATORY_TRACT
  Filled 2010-12-06 (×4): qty 2.5

## 2010-12-06 NOTE — ED Notes (Signed)
Pt informed no beds available in hospital & she will be staying in the er tonight. Family member not pleased. Pt states she is not sure she can stay on stretcher all night d/t her back.

## 2010-12-06 NOTE — ED Notes (Signed)
Pt daughter left phone number for any needs. Daughter name Rogers Blocker [phone].

## 2010-12-06 NOTE — Progress Notes (Signed)
Pt discharge instructions reviewed with patient. Patient verbalized understanding.

## 2010-12-06 NOTE — ED Notes (Signed)
Family states pt had only been walking from bed to bed side commode for the last week while admitted. Had been mentioned to pt about getting a walker, none provided. Pt walked at home became sob & had to return to the er.

## 2010-12-06 NOTE — Progress Notes (Signed)
Pt put on bipap in er holding of setting of 16/8 with 4lpm bleed in with medium full face mask. Pt tol well

## 2010-12-06 NOTE — ED Notes (Signed)
Ac called for meds.

## 2010-12-06 NOTE — ED Notes (Signed)
CRITICAL VALUE ALERT  Critical value received:  PCO2  Date of notification:  12/06/10  Time of notification:  1829  Critical value read back:yes  Nurse who received alert:  Marliss Czar, RN  MD notified (1st page):  [phone] Dr Radford Pax  No orders received

## 2010-12-06 NOTE — Discharge Summary (Signed)
Physician Discharge Summary  Patient ID: [name] MRN: [id number] DOB/AGE: 1946/01/15 65 y.o. Primary Care Physician:VYAS,CHANDRA KANT, MD Admit date: 11/29/2010 Discharge date: 12/06/2010    Discharge Diagnoses:  1. COPD exacerbation, improved. 2. Hyponatremia, resolved. 3. Type 2 diabetes mellitus. 4. Hypertension. 5. Oral candidiasis, improved.   Current Discharge Medication List    START taking these medications   Details  moxifloxacin (AVELOX) 400 MG tablet Take 1 tablet (400 mg total) by mouth daily at 6 PM. Qty: 5 tablet, Refills: 0      CONTINUE these medications which have CHANGED   Details  predniSONE (DELTASONE) 20 MG tablet Take 2 tablets daily for 3 days, then 1 tablet daily for 3 days, then half tablet daily for 3 days, then stop. Qty: 12 tablet, Refills: 0      CONTINUE these medications which have NOT CHANGED   Details  albuterol (PROAIR HFA) 108 (90 BASE) MCG/ACT inhaler Inhale 2 puffs into the lungs 4 (four) times daily.      albuterol (PROVENTIL) (2.5 MG/3ML) 0.083% nebulizer solution Take 2.5 mg by nebulization every 6 (six) hours as needed. For shortness of breath     budesonide-formoterol (SYMBICORT) 160-4.5 MCG/ACT inhaler Inhale 2 puffs into the lungs 2 (two) times daily.      cetirizine (ZYRTEC) 10 MG tablet Take 10 mg by mouth at bedtime.     cyclobenzaprine (FLEXERIL) 10 MG tablet Take 10 mg by mouth every 8 (eight) hours.      diazepam (VALIUM) 10 MG tablet Take 10 mg by mouth every 12 (twelve) hours as needed. For anxiety    FLUoxetine (PROZAC) 20 MG capsule Take 60 mg by mouth daily.     hydrochlorothiazide 25 MG tablet Take 12.5 mg by mouth at bedtime.     ipratropium (ATROVENT HFA) 17 MCG/ACT inhaler Inhale 2 puffs into the lungs 4 (four) times daily.      ipratropium-albuterol (DUONEB) 0.5-2.5 (3) MG/3ML SOLN Take 3 mLs by nebulization every 4 (four) hours as needed. For shortness of breath    lisinopril (PRINIVIL,ZESTRIL) 20  MG tablet Take 20 mg by mouth at bedtime.      OLANZapine (ZYPREXA) 10 MG tablet Take 10 mg by mouth at bedtime.      omeprazole (PRILOSEC) 20 MG capsule Take 20 mg by mouth daily.      oxyCODONE-acetaminophen (PERCOCET) 10-325 MG per tablet Take 1 tablet by mouth 4 (four) times daily.      pseudoephedrine-acetaminophen (TYLENOL SINUS) 30-500 MG TABS Take 1 tablet by mouth every 4 (four) hours as needed. For symptoms    tiotropium (SPIRIVA) 18 MCG inhalation capsule Place 18 mcg into inhaler and inhale daily.      traMADol (ULTRAM) 50 MG tablet Take 50-100 mg by mouth every 6 (six) hours as needed. Take 1-2 tabs by mouth every 6 hours as needed for pain    meclizine (ANTIVERT) 25 MG tablet 12.5 mg. Take .5-1 tablet by mouth three times a day as needed      STOP taking these medications     mometasone-formoterol (DULERA) 100-5 MCG/ACT AERO      theophylline (THEO-24) 200 MG 24 hr capsule         Discharged Condition: Stable and improved.    Consults: None.  Significant Diagnostic Studies: Dg Chest Portable 1 View  11/29/2010  *RADIOLOGY REPORT*  Clinical Data: Shortness of breath.  PORTABLE CHEST - 1 VIEW  Comparison: 03/18/2008.  Findings: COPD with hyperinflation.  Heart size upper  limits normal.  Calcified tortuous aorta.  Normal osseous structures. Little change from priors.  IMPRESSION: COPD, no active infiltrates.  Original Report Authenticated By: Elsie Stain, M.D.    Lab Results: Results for orders placed during the hospital encounter of 11/29/10 (from the past 48 hour(s))  BLOOD GAS, ARTERIAL     Status: Abnormal   Collection Time   12/04/10 11:50 AM      Component Value Range Comment   O2 Content 4.0      Delivery systems NASAL CANNULA      pH, Arterial 7.373  7.350 - 7.400     pCO2 arterial 68.0 (*) 35.0 - 45.0 (mmHg)    pO2, Arterial 76.0 (*) 80.0 - 100.0 (mmHg)    Bicarbonate 38.6 (*) 20.0 - 24.0 (mEq/L)    TCO2 35.4  0 - 100 (mmol/L)    Acid-Base  Excess 12.9 (*) 0.0 - 2.0 (mmol/L)    O2 Saturation 95.0      Patient temperature 37.0      Collection site RIGHT BRACHIAL      Drawn by COLLECTED BY RT      Sample type ARTERIAL      Allens test (pass/fail) NOT INDICATED (*) PASS    BASIC METABOLIC PANEL     Status: Abnormal   Collection Time   12/05/10  3:57 AM      Component Value Range Comment   Sodium 140  135 - 145 (mEq/L)    Potassium 3.9  3.5 - 5.1 (mEq/L)    Chloride 94 (*) 96 - 112 (mEq/L)    CO2 40 (*) 19 - 32 (mEq/L)    Glucose, Bld 187 (*) 70 - 99 (mg/dL)    BUN 13  6 - 23 (mg/dL)    Creatinine, Ser 1.61  0.50 - 1.10 (mg/dL)    Calcium 9.2  8.4 - 10.5 (mg/dL)    GFR calc non Af Amer 88 (*) >90 (mL/min)    GFR calc Af Amer >90  >90 (mL/min)   BLOOD GAS, ARTERIAL     Status: Abnormal   Collection Time   12/05/10  9:00 AM      Component Value Range Comment   O2 Content 3.0      pH, Arterial 7.360  7.350 - 7.400     pCO2 arterial 65.8 (*) 35.0 - 45.0 (mmHg)    pO2, Arterial 95.5  80.0 - 100.0 (mmHg)    Bicarbonate 36.2 (*) 20.0 - 24.0 (mEq/L)    TCO2 33.3  0 - 100 (mmol/L)    Acid-Base Excess 10.5 (*) 0.0 - 2.0 (mmol/L)    O2 Saturation 97.3      Patient temperature 37.0      Collection site LEFT RADIAL      Drawn by COLLECTED BY RT      Sample type ARTERIAL      Allens test (pass/fail) PASS  PASS    BLOOD GAS, ARTERIAL     Status: Abnormal   Collection Time   12/05/10 11:20 AM      Component Value Range Comment   O2 Content 2.0      Delivery systems NASAL CANNULA      pH, Arterial 7.378  7.350 - 7.400     pCO2 arterial 62.6 (*) 35.0 - 45.0 (mmHg)    pO2, Arterial 73.3 (*) 80.0 - 100.0 (mmHg)    Bicarbonate 36.1 (*) 20.0 - 24.0 (mEq/L)    TCO2 33.0  0 - 100 (mmol/L)  Acid-Base Excess 10.6 (*) 0.0 - 2.0 (mmol/L)    O2 Saturation 94.4      Patient temperature 37.0      Collection site RIGHT RADIAL      Drawn by COLLECTED BY RT      Sample type ARTERIAL      Allens test (pass/fail) PASS  PASS      GLUCOSE, CAPILLARY     Status: Abnormal   Collection Time   12/05/10  9:49 PM      Component Value Range Comment   Glucose-Capillary 177 (*) 70 - 99 (mg/dL)    Comment 1 Documented in Chart     CBC     Status: Abnormal   Collection Time   12/06/10  5:18 AM      Component Value Range Comment   WBC 14.1 (*) 4.0 - 10.5 (K/uL)    RBC 3.57 (*) 3.87 - 5.11 (MIL/uL)    Hemoglobin 11.1 (*) 12.0 - 15.0 (g/dL)    HCT 16.1 (*) 09.6 - 46.0 (%)    MCV 96.6  78.0 - 100.0 (fL)    MCH 31.1  26.0 - 34.0 (pg)    MCHC 32.2  30.0 - 36.0 (g/dL)    RDW 04.5  40.9 - 81.1 (%)    Platelets 310  150 - 400 (K/uL)   COMPREHENSIVE METABOLIC PANEL     Status: Abnormal   Collection Time   12/06/10  5:18 AM      Component Value Range Comment   Sodium 136  135 - 145 (mEq/L)    Potassium 4.0  3.5 - 5.1 (mEq/L)    Chloride 92 (*) 96 - 112 (mEq/L)    CO2 40 (*) 19 - 32 (mEq/L)    Glucose, Bld 152 (*) 70 - 99 (mg/dL)    BUN 11  6 - 23 (mg/dL)    Creatinine, Ser 9.14  0.50 - 1.10 (mg/dL)    Calcium 9.0  8.4 - 10.5 (mg/dL)    Total Protein 5.6 (*) 6.0 - 8.3 (g/dL)    Albumin 2.9 (*) 3.5 - 5.2 (g/dL)    AST 14  0 - 37 (U/L)    ALT 41 (*) 0 - 35 (U/L)    Alkaline Phosphatase 46  39 - 117 (U/L)    Total Bilirubin 0.3  0.3 - 1.2 (mg/dL)    GFR calc non Af Amer 90 (*) >90 (mL/min)    GFR calc Af Amer >90  >90 (mL/min)   GLUCOSE, CAPILLARY     Status: Abnormal   Collection Time   12/06/10  7:50 AM      Component Value Range Comment   Glucose-Capillary 208 (*) 70 - 99 (mg/dL)    Comment 1 Notify RN      Recent Results (from the past 240 hour(s))  MRSA PCR SCREENING     Status: Abnormal   Collection Time   11/30/10 12:22 AM      Component Value Range Status Comment   MRSA by PCR POSITIVE (*) NEGATIVE  Final      Hospital Course: This 65 year old lady was admitted with dyspnea for approximately one week. She is oxygen dependent at home with her COPD and usually is on 4 L oxygen per minute. She was admitted and  treated with intravenous steroids and antibiotics and made a good improvement. She had oral candidiasis which was treated with nystatin. She was hyponatremic on presentation but this resolved towards the end of the hospitalization. The reason for this  may have been likely a degree of dehydration. She normally is on an ACE inhibitor and HCTZ which should be continued for the time being and her electrolytes should be monitored closely by her primary care physician. She also has a diagnoses of diabetes but is not taking any medications for this. It appears to be diet controlled. During hospitalization her sugars were controlled with a sliding scale of insulin and this will need to be addressed also as an outpatient. I would expect her sugars to be elevated while she is on steroids. Today she feels reasonably well and a is saturating above 92% on only 3 L of oxygen per minute. Her arterial blood gases showed a type II respiratory failure on 4 L of oxygen and therefore her oxygen was turned down which I think is probably what is required. This will help her oxygen drive.  Discharge Exam: Blood pressure 137/83, pulse 105, temperature 97.6 F (36.4 C), temperature source Axillary, resp. rate 20, height 5' (1.524 m), weight 75.8 kg (167 lb 1.7 oz), SpO2 97.00%. She looks systemically well. There is no increased work of breathing. She is not septic or toxic. Lung fields show very minimal wheezing throughout both lung fields now. They're no crackles and  there is no bronchial breathing. Heart sounds are present and normal. She is alert and orientated without any focal neurological signs.  Disposition: Home. She will complete a reducing course of prednisone and a further 5 day course of antibiotics. Her diabetes needs to be addressed with her primary care physician to assess whether she needs oral hypoglycemic agents once her steroids are discontinued.  Discharge Orders    Future Orders Please Complete By Expires    Diet - low sodium heart healthy      Increase activity slowly      Call MD for:  temperature >100.4      Call MD for:  difficulty breathing, headache or visual disturbances           Signed: GOSRANI,NIMISH C 12/06/2010, 11:20 AM

## 2010-12-06 NOTE — H&P (Signed)
PCP:   Darrow Bussing, MD   Chief Complaint:  Dyspnea, failure at home  HPI: 65yo F with h/o stage 4 COPD, DM2 who was discharged earlier today after 1wk stay for COPD exacerbation. She was discharged on Prednisone, Moxifloxacin, and with 3L of O2 per minute.   She went home this morning and at some point went to the bathroom. Her husband left her enough cord on the nasal cannula to get to the bathroom, but she didn't know this and so took it off to go to the bathroom and not surprisingly got SOB. So they came back to the ED.   In the ED, it seems the real issue is that the daughter is upset that she was discharged so quickly, without appropriate time for her to get things set up. For example, she states that while in the hospital the patient walked once around the floor but was totally winded while doing so, and for the most part was unable to walk even to the bathroom. She is angry that she was discharged without even a script for a bedside commode and was told to follow up with her PCP for this. The patient and the daughter are aware that her medical condition is not reversible and may not likely get better, the daughter is just requesting more time to get things set up as an outpatient and feels that her discharge was rushed. When hospice or palliative care is broached, they are interested.   Review of Systems:  SOB and current chronic back pain, otherwise no acute changes from discharge this morning. ROS o/w negative.    Past Medical History  Diagnosis Date  . GERD (gastroesophageal reflux disease)   . Allergic rhinitis   . Pneumonia   . Hypertension   . Diabetes mellitus, type 2   . 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 Duodenal ulcer   . Hiatal hernia   . Diverticulosis   . IBS (irritable bowel syndrome)   . Diverticulitis 2001  . Anxiety   . Chronic airway obstruction, not elsewhere classified   . COPD (chronic  obstructive pulmonary disease)     stage 4   . CHF (congestive heart failure)     Past Surgical History  Procedure Date  . Cesarean section 1971  . Abdominal hysterectomy 1980  . Colectomy 07/1999    Sigmoid  . Rod in left leg   . Cholecystectomy     ERCP sphincterotomy, stone extraction 2006    Medications:  HOME MEDS: Prior to Admission medications   Medication Sig Start Date End Date Taking? Authorizing Provider  albuterol (PROAIR HFA) 108 (90 BASE) MCG/ACT inhaler Inhale 2 puffs into the lungs 4 (four) times daily.     Yes Historical Provider, MD  albuterol (PROVENTIL) (2.5 MG/3ML) 0.083% nebulizer solution Take 2.5 mg by nebulization every 6 (six) hours as needed. For shortness of breath    Yes Historical Provider, MD  budesonide-formoterol (SYMBICORT) 160-4.5 MCG/ACT inhaler Inhale 2 puffs into the lungs 2 (two) times daily.     Yes Historical Provider, MD  cetirizine (ZYRTEC) 10 MG tablet Take 10 mg by mouth at bedtime.    Yes Historical Provider, MD  cyclobenzaprine (FLEXERIL) 10 MG tablet Take 10 mg by mouth every 8 (eight) hours.     Yes Historical Provider, MD  diazepam (VALIUM) 10 MG tablet Take 10 mg by mouth every 12 (twelve) hours as needed. For anxiety  Yes Historical Provider, MD  FLUoxetine (PROZAC) 20 MG capsule Take 60 mg by mouth daily.    Yes Historical Provider, MD  hydrochlorothiazide 25 MG tablet Take 12.5 mg by mouth at bedtime.    Yes Historical Provider, MD  ipratropium (ATROVENT HFA) 17 MCG/ACT inhaler Inhale 2 puffs into the lungs 4 (four) times daily.     Yes Historical Provider, MD  ipratropium-albuterol (DUONEB) 0.5-2.5 (3) MG/3ML SOLN Take 3 mLs by nebulization every 4 (four) hours as needed. For shortness of breath   Yes Historical Provider, MD  lisinopril (PRINIVIL,ZESTRIL) 20 MG tablet Take 20 mg by mouth at bedtime.     Yes Historical Provider, MD  OLANZapine (ZYPREXA) 10 MG tablet Take 10 mg by mouth at bedtime.     Yes Historical Provider, MD    omeprazole (PRILOSEC) 20 MG capsule Take 20 mg by mouth daily.     Yes Historical Provider, MD  oxyCODONE-acetaminophen (PERCOCET) 10-325 MG per tablet Take 1 tablet by mouth 4 (four) times daily.     Yes Historical Provider, MD  tiotropium (SPIRIVA) 18 MCG inhalation capsule Place 18 mcg into inhaler and inhale daily.     Yes Historical Provider, MD  traMADol (ULTRAM) 50 MG tablet Take 50-100 mg by mouth every 6 (six) hours as needed. Take 1-2 tabs by mouth every 6 hours as needed for pain   Yes Historical Provider, MD  meclizine (ANTIVERT) 25 MG tablet 12.5 mg. Take .5-1 tablet by mouth three times a day as needed    Historical Provider, MD  moxifloxacin (AVELOX) 400 MG tablet Take 1 tablet (400 mg total) by mouth daily at 6 PM. 12/06/10 12/16/10  Nimish C Gosrani  predniSONE (DELTASONE) 20 MG tablet Take 2 tablets daily for 3 days, then 1 tablet daily for 3 days, then half tablet daily for 3 days, then stop. 12/06/10   Nimish C Gosrani  pseudoephedrine-acetaminophen (TYLENOL SINUS) 30-500 MG TABS Take 1 tablet by mouth every 4 (four) hours as needed. For symptoms    Historical Provider, MD    Allergies:  Allergies  Allergen Reactions  . Nsaids     Hx of gastric ulcers    Social History:   reports that she has been smoking Cigarettes.  She has been smoking about .5 packs per day. She does not have any smokeless tobacco history on file. She reports that she does not drink alcohol or use illicit drugs.  Family History: Family History  Problem Relation Age of Onset  . Aneurysm Mother   . Lung cancer Father   . Diabetes Sister   . Diabetes Sister     Physical Exam: Filed Vitals:   12/06/10 1933 12/06/10 1950 12/06/10 2005 12/06/10 2102  BP: 159/96 159/96  135/85  Pulse: 110 126  122  Temp:      TempSrc:      Resp:  28  26  Height:      Weight:      SpO2: 97% 97% 100% 98%   Blood pressure 135/85, pulse 122, temperature 97.7 F (36.5 C), temperature source Oral, resp. rate 26,  height 5' (1.524 m), weight 72.576 kg (160 lb), SpO2 98.00%.  Gen: Middle aged lady with audible wheezing, not moving much air, appears uncomfortable but not using accessory muscles to breath. Doesn't speak, defers to daughter.  HEENT: Pupils round, equal, EOMI, sclera are clear. Lips are not cyanotic.  Lungs: Not moving much air, sounds grossly like severe COPD with expiratory squeaks and wheezes Heart:  Tachycardic but regular, no m/g Abd: Soft, non peritoneal, non distended Extrem: Warm, well perfused, no BLE edema, no noted cyanosis Neuro: Alert, following conversation, moves extremities spontaneously.  Skin: Slight diaphoresis   Labs & Imaging Results for orders placed during the hospital encounter of 12/06/10 (from the past 48 hour(s))  CBC     Status: Abnormal   Collection Time   12/06/10  5:36 PM      Component Value Range Comment   WBC 18.1 (*) 4.0 - 10.5 (K/uL)    RBC 4.02  3.87 - 5.11 (MIL/uL)    Hemoglobin 12.6  12.0 - 15.0 (g/dL)    HCT 40.9  81.1 - 91.4 (%)    MCV 96.5  78.0 - 100.0 (fL)    MCH 31.3  26.0 - 34.0 (pg)    MCHC 32.5  30.0 - 36.0 (g/dL)    RDW 78.2  95.6 - 21.3 (%)    Platelets 402 (*) 150 - 400 (K/uL)   BASIC METABOLIC PANEL     Status: Abnormal   Collection Time   12/06/10  5:36 PM      Component Value Range Comment   Sodium 133 (*) 135 - 145 (mEq/L)    Potassium 5.0  3.5 - 5.1 (mEq/L) DELTA CHECK NOTED   Chloride 89 (*) 96 - 112 (mEq/L)    CO2 38 (*) 19 - 32 (mEq/L)    Glucose, Bld 274 (*) 70 - 99 (mg/dL)    BUN 14  6 - 23 (mg/dL)    Creatinine, Ser 0.86  0.50 - 1.10 (mg/dL)    Calcium 9.0  8.4 - 10.5 (mg/dL)    GFR calc non Af Amer 89 (*) >90 (mL/min)    GFR calc Af Amer >90  >90 (mL/min)   BLOOD GAS, ARTERIAL     Status: Abnormal   Collection Time   12/06/10  6:15 PM      Component Value Range Comment   O2 Content 3.0      Delivery systems NASAL CANNULA      pH, Arterial 7.328 (*) 7.350 - 7.400     pCO2 arterial 70.0 (*) 35.0 - 45.0 (mmHg)      pO2, Arterial 109.0 (*) 80.0 - 100.0 (mmHg)    Bicarbonate 35.7 (*) 20.0 - 24.0 (mEq/L)    TCO2 32.6  0 - 100 (mmol/L)    Acid-Base Excess 9.7 (*) 0.0 - 2.0 (mmol/L)    O2 Saturation 97.6      Patient temperature 37.0      Collection site RIGHT RADIAL      Drawn by COLLECTED BY RT      Sample type ARTERIAL      Allens test (pass/fail) PASS  PASS     Dg Chest Portable 1 View  12/06/2010  *RADIOLOGY REPORT*  Clinical Data: Severe shortness of breath.  Patient discharged from the hospital earlier today.  PORTABLE CHEST - 1 VIEW 12/06/2010 1749 hours:  Comparison: Portable chest x-ray 11/29/2010 and 03/18/2008 [facility].  Findings: Bullous emphysematous changes in the upper lobes.  Lungs clear.  No visible pleural effusions.  Cardiac silhouette upper normal in size to slightly enlarged for the AP portable technique, unchanged.  IMPRESSION: Severe COPD/emphysema.  No acute cardiopulmonary disease.  Stable borderline heart size.  Original Report Authenticated By: Arnell Sieving, M.D.    Impression Present on Admission:  .COPD with exacerbation .Back pain, chronic .Tachycardia  65yo F with h/o stage 4 COPD, DM2 who was discharged  earlier today after 1wk stay for COPD exacerbation, now presents with SOB that is at baseline, however daughter is upset she was discharged too quickly without time for them to get her set up at home.   1. COPD: Medically she is not much different than from discharge, with severe, what appears to be end stage COPD. They are aware that her prognosis is quite poor and that this is not reversible and her breathing will not likely get better. Therefore, will not initiate much change from a therapeutic standpoint, continue meds she was discharged on including Prednisone, Avelox, and will give aggressive nebs.   - Recommend Palliative and / or hospice consultation.  - SW consultation to assist with home needs - PT consultation   2. Tachycardia: Likely due to  air hunger / COPD, and albuterol effect. Just monitor and treat underlying issue at present. It's regular, without evidence of ischemia on EKG.   3. DM: She is running hyperglyemic, will give SSI.   4. Leukocytosis: has been rising through admission, likely due to steroid effect, no signs of acute infection  5. HypoNa: also has been chronic, will just monitor.    Other plans as per orders.    Eldar Robitaille 12/06/2010, 10:05 PM

## 2010-12-06 NOTE — ED Provider Notes (Addendum)
Scribed for Desiree Shi, MD, the patient was seen in room APA17/APA17 . This chart was scribed by Ellie Lunch.   CSN: [id number] Arrival date & time: 12/06/2010  5:17 PM   First MD Initiated Contact with Patient 12/06/10 1727      Chief Complaint  Patient presents with  . Respiratory Distress     HPI Pt seen at 17:34 [name] is a 65 y.o. female who presents to the Emergency Department complaining of SOB. Pt was discharged from hospital today- was admitted x 1 week for COPD exacerbation. Today pt became SOB ~ 2 hours ago with associated cough and wheezing. SOB is constant and severe. Nothing makes SOB better. Pt denies smoking a cigarette today.    Past Medical History  Diagnosis Date  . GERD (gastroesophageal reflux disease)   . Allergic rhinitis   . Pneumonia   . Hypertension   . Diabetes mellitus, type 2   . 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 Duodenal ulcer   . Hiatal hernia   . Diverticulosis   . IBS (irritable bowel syndrome)   . Diverticulitis 2001  . Anxiety   . Chronic airway obstruction, not elsewhere classified   . 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 CHF (congestive heart failure)     Past Surgical History  Procedure Date  . Cesarean section 1971  . Abdominal hysterectomy 1980  . Colectomy 07/1999    Sigmoid  . Rod in left leg   . Cholecystectomy     ERCP sphincterotomy, stone extraction 2006    Family History  Problem Relation Age of Onset  . Aneurysm Mother   . Lung cancer Father   . Diabetes Sister   . Diabetes Sister     History  Substance Use Topics  . Smoking status: Current Some Day Smoker -- 0.5 packs/day    Types: Cigarettes    Last Attempt to Quit: 07/01/2010  . Smokeless tobacco: Not on file  . Alcohol Use: No    Review of Systems 10 Systems reviewed and are negative for acute change except as noted in the HPI.   Allergies   Nsaids  Home Medications   Current Outpatient Rx  Name Route Sig Dispense Refill  . ALBUTEROL SULFATE HFA 108 (90 BASE) MCG/ACT IN AERS Inhalation Inhale 2 puffs into the lungs 4 (four) times daily.      . ALBUTEROL SULFATE (2.5 MG/3ML) 0.083% IN NEBU Nebulization Take 2.5 mg by nebulization every 6 (six) hours as needed. For shortness of breath     . BUDESONIDE-FORMOTEROL FUMARATE 160-4.5 MCG/ACT IN AERO Inhalation Inhale 2 puffs into the lungs 2 (two) times daily.      Marland Kitchen CETIRIZINE HCL 10 MG PO TABS Oral Take 10 mg by mouth at bedtime.     . CYCLOBENZAPRINE HCL 10 MG PO TABS Oral Take 10 mg by mouth every 8 (eight) hours.      Marland Kitchen DIAZEPAM 10 MG PO TABS Oral Take 10 mg by mouth every 12 (twelve) hours as needed. For anxiety    . FLUOXETINE HCL 20 MG PO CAPS Oral Take 60 mg by mouth daily.     Marland Kitchen HYDROCHLOROTHIAZIDE 25 MG PO TABS Oral Take 12.5 mg by mouth at bedtime.     . IPRATROPIUM BROMIDE HFA 17 MCG/ACT IN AERS Inhalation Inhale 2 puffs into the lungs 4 (four) times daily.      Marland Kitchen  IPRATROPIUM-ALBUTEROL 0.5-2.5 (3) MG/3ML IN SOLN Nebulization Take 3 mLs by nebulization every 4 (four) hours as needed. For shortness of breath    . LISINOPRIL 20 MG PO TABS Oral Take 20 mg by mouth at bedtime.      Marland Kitchen MECLIZINE HCL 25 MG PO TABS  12.5 mg. Take .5-1 tablet by mouth three times a day as needed    . MOXIFLOXACIN HCL 400 MG PO TABS Oral Take 1 tablet (400 mg total) by mouth daily at 6 PM. 5 tablet 0  . OLANZAPINE 10 MG PO TABS Oral Take 10 mg by mouth at bedtime.      . OMEPRAZOLE 20 MG PO CPDR Oral Take 20 mg by mouth daily.      . OXYCODONE-ACETAMINOPHEN 10-325 MG PO TABS Oral Take 1 tablet by mouth 4 (four) times daily.      Marland Kitchen PREDNISONE 20 MG PO TABS  Take 2 tablets daily for 3 days, then 1 tablet daily for 3 days, then half tablet daily for 3 days, then stop. 12 tablet 0  . PSEUDOEPHEDRINE-ACETAMINOPHEN 30-500 MG PO TABS Oral Take 1 tablet by mouth every 4 (four) hours as needed. For symptoms     . TIOTROPIUM BROMIDE MONOHYDRATE 18 MCG IN CAPS Inhalation Place 18 mcg into inhaler and inhale daily.      . TRAMADOL HCL 50 MG PO TABS Oral Take 50-100 mg by mouth every 6 (six) hours as needed. Take 1-2 tabs by mouth every 6 hours as needed for pain      BP 188/100  Pulse 134  Temp(Src) 97.7 F (36.5 C) (Oral)  Resp 30  Ht 5' (1.524 m)  Wt 160 lb (72.576 kg)  BMI 31.25 kg/m2  SpO2 96%  Physical Exam  Nursing note and vitals reviewed. Constitutional: She is oriented to person, place, and time. She appears well-developed and well-nourished.  HENT:  Head: Normocephalic and atraumatic.  Eyes: EOM are normal. No scleral icterus.  Neck: Neck supple.  Cardiovascular:       tachycardic  Pulmonary/Chest: She has wheezes.       Tachypnic. Accessory muscle usage.   Abdominal: Soft. There is no tenderness.  Musculoskeletal: Normal range of motion. She exhibits no edema.  Neurological: She is alert and oriented to person, place, and time.  Skin: Skin is warm and dry.  Psychiatric: She has a normal mood and affect.    ED Course  Procedures (including critical care time)  Labs Reviewed  CBC - Abnormal; Notable for the following:    WBC 18.1 (*)    Platelets 402 (*)    All other components within normal limits  BASIC METABOLIC PANEL - Abnormal; Notable for the following:    Sodium 133 (*)    Chloride 89 (*)    CO2 38 (*)    Glucose, Bld 274 (*)    GFR calc non Af Amer 89 (*)    All other components within normal limits  BLOOD GAS, ARTERIAL - Abnormal; Notable for the following:    pH, Arterial 7.328 (*)    pCO2 arterial 70.0 (*)    pO2, Arterial 109.0 (*)    Bicarbonate 35.7 (*)    Acid-Base Excess 9.7 (*)    All other components within normal limits   Dg Chest Portable 1 View  12/06/2010  *RADIOLOGY REPORT*  Clinical Data: Severe shortness of breath.  Patient discharged from the hospital earlier today.  PORTABLE CHEST - 1 VIEW 12/06/2010 1749 hours:  Comparison: Portable  chest x-ray 11/29/2010 and 03/18/2008 [facility].  Findings: Bullous emphysematous changes in the upper lobes.  Lungs clear.  No visible pleural effusions.  Cardiac silhouette upper normal in size to slightly enlarged for the AP portable technique, unchanged.  IMPRESSION: Severe COPD/emphysema.  No acute cardiopulmonary disease.  Stable borderline heart size.  Original Report Authenticated By: Arnell Sieving, M.D.   ED MEDICATIONS Medications  albuterol (PROVENTIL) (5 MG/ML) 0.5% nebulizer solution 5 mg   ipratropium (ATROVENT) nebulizer solution 0.5 mg      Diagnosis:  1 COPD exacerbation  2 chronic back pain  MDM  I personally performed the services described in this documentation, which was scribed in my presence. The recorded information has been reviewed and considered.    Case was discussed with the hospitalist as I dictated.  Hospitalist will admit patient.       Desiree Shi, MD 12/06/10 2223  Desiree Shi, MD 12/06/10 [phone]

## 2010-12-06 NOTE — Progress Notes (Signed)
Patient accompanied to awaiting vehicle via w/c with oxygen in place. Pt stable on discharge.

## 2010-12-06 NOTE — ED Notes (Signed)
Placed on bipap by resp.  Resp wanted med change to xopenex,  Md says to leave on albuterol

## 2010-12-06 NOTE — Progress Notes (Signed)
CRITICAL VALUE ALERT  Critical value received:  CO2 40  Date of notification:  12/06/2010  Time of notification:  06:50 am  Critical value read back:yes  Nurse who received alert:  Rhae Hammock, RN  MD notified (1st page):  Dr. Kaylyn Layer  Time of first page:  06:51 am  MD notified (2nd page):  Time of second page:  Responding MD:  Dr. Kaylyn Layer  Time MD responded:  06:52   Spoke with Dr. Kaylyn Layer.  Patient has chronic COPD and this is about normal for her.  No new orders given.

## 2010-12-06 NOTE — ED Notes (Signed)
Pt was discharged from hospital today-was admitted x 1 week with COPD exacerbation; reports became very SOB approx 2 hours ago.

## 2010-12-07 ENCOUNTER — Encounter (HOSPITAL_COMMUNITY): Payer: Self-pay | Admitting: *Deleted

## 2010-12-07 LAB — GLUCOSE, CAPILLARY: Glucose-Capillary: 253 mg/dL — ABNORMAL HIGH (ref 70–99)

## 2010-12-07 LAB — BASIC METABOLIC PANEL
CO2: 38 mEq/L — ABNORMAL HIGH (ref 19–32)
Calcium: 9.2 mg/dL (ref 8.4–10.5)
Creatinine, Ser: 0.62 mg/dL (ref 0.50–1.10)
GFR calc non Af Amer: >90 mL/min (ref >90)
Glucose, Bld: 217 mg/dL — ABNORMAL HIGH (ref 70–99)

## 2010-12-07 LAB — CBC
MCH: 31.3 pg (ref 26.0–34.0)
MCHC: 32.9 g/dL (ref 30.0–36.0)
MCV: 95.3 fL (ref 78.0–100.0)
Platelets: 323 10*3/uL (ref 150–400)
RDW: 13.1 % (ref 11.5–15.5)

## 2010-12-07 LAB — BLOOD GAS, ARTERIAL
Acid-Base Excess: 10.4 mmol/L — ABNORMAL HIGH (ref 0.0–2.0)
Bicarbonate: 35.6 mEq/L — ABNORMAL HIGH (ref 20.0–24.0)
O2 Content: 4 L/min
O2 Saturation: 85.1 %
TCO2: 32.1 mmol/L (ref 0–100)
pO2, Arterial: 52.3 mmHg — ABNORMAL LOW (ref 80.0–100.0)

## 2010-12-07 MED ORDER — MORPHINE SULFATE 20 MG/5ML PO SOLN: 5.0000 mg | ORAL | Status: AC | PRN

## 2010-12-07 MED ORDER — OXYCODONE-ACETAMINOPHEN 5-325 MG PO TABS
ORAL_TABLET | ORAL | Status: AC
  Administered 2010-12-07: 2 via ORAL
  Filled 2010-12-07: qty 2

## 2010-12-07 MED ORDER — OXYCODONE-ACETAMINOPHEN 5-325 MG PO TABS
1.0000 | ORAL_TABLET | Freq: Four times a day (QID) | ORAL | Status: DC | PRN
  Administered 2010-12-07 (×3): 2 via ORAL
  Filled 2010-12-07 (×2): qty 2

## 2010-12-07 NOTE — Discharge Summary (Signed)
Physician Discharge Summary  Patient ID: [name] MRN: [id number] DOB/AGE: [birth year] 65 y.o. Primary Care Physician:VYAS,CHANDRA KANT, MD Admit date: 12/06/2010 Discharge date: 12/07/2010    Discharge Diagnoses:  1. COPD, end-stage.   Current Discharge Medication List    START taking these medications   Details  morphine 20 MG/5ML solution Take 1.3 mLs (5.2 mg total) by mouth every 4 (four) hours as needed for pain (Dyspnoea). Qty: 30 mL, Refills: 0      CONTINUE these medications which have NOT CHANGED   Details  albuterol (PROAIR HFA) 108 (90 BASE) MCG/ACT inhaler Inhale 2 puffs into the lungs 4 (four) times daily.      albuterol (PROVENTIL) (2.5 MG/3ML) 0.083% nebulizer solution Take 2.5 mg by nebulization every 6 (six) hours as needed. For shortness of breath     budesonide-formoterol (SYMBICORT) 160-4.5 MCG/ACT inhaler Inhale 2 puffs into the lungs 2 (two) times daily.      cetirizine (ZYRTEC) 10 MG tablet Take 10 mg by mouth at bedtime.     cyclobenzaprine (FLEXERIL) 10 MG tablet Take 10 mg by mouth every 8 (eight) hours.      diazepam (VALIUM) 10 MG tablet Take 10 mg by mouth every 12 (twelve) hours as needed. For anxiety    FLUoxetine (PROZAC) 20 MG capsule Take 60 mg by mouth daily.     hydrochlorothiazide 25 MG tablet Take 12.5 mg by mouth at bedtime.     ipratropium (ATROVENT HFA) 17 MCG/ACT inhaler Inhale 2 puffs into the lungs 4 (four) times daily.      ipratropium-albuterol (DUONEB) 0.5-2.5 (3) MG/3ML SOLN Take 3 mLs by nebulization every 4 (four) hours as needed. For shortness of breath    lisinopril (PRINIVIL,ZESTRIL) 20 MG tablet Take 20 mg by mouth at bedtime.      OLANZapine (ZYPREXA) 10 MG tablet Take 10 mg by mouth at bedtime.      omeprazole (PRILOSEC) 20 MG capsule Take 20 mg by mouth daily.      oxyCODONE-acetaminophen (PERCOCET) 10-325 MG per tablet Take 1 tablet by mouth 4 (four) times daily.      tiotropium (SPIRIVA) 18 MCG  inhalation capsule Place 18 mcg into inhaler and inhale daily.      traMADol (ULTRAM) 50 MG tablet Take 50-100 mg by mouth every 6 (six) hours as needed. Take 1-2 tabs by mouth every 6 hours as needed for pain    meclizine (ANTIVERT) 25 MG tablet 12.5 mg. Take .5-1 tablet by mouth three times a day as needed    moxifloxacin (AVELOX) 400 MG tablet Take 1 tablet (400 mg total) by mouth daily at 6 PM. Qty: 5 tablet, Refills: 0    predniSONE (DELTASONE) 20 MG tablet Take 2 tablets daily for 3 days, then 1 tablet daily for 3 days, then half tablet daily for 3 days, then stop. Qty: 12 tablet, Refills: 0    pseudoephedrine-acetaminophen (TYLENOL SINUS) 30-500 MG TABS Take 1 tablet by mouth every 4 (four) hours as needed. For symptoms        Discharged Condition: Stable.    Consults: None. Involvement of hospice services of [facility].  Significant Diagnostic Studies: Dg Chest Portable 1 View  12/06/2010  *RADIOLOGY REPORT*  Clinical Data: Severe shortness of breath.  Patient discharged from the hospital earlier today.  PORTABLE CHEST - 1 VIEW 12/06/2010 1749 hours:  Comparison: Portable chest x-ray 11/29/2010 and 03/18/2008 [facility].  Findings: Bullous emphysematous changes in the upper lobes.  Lungs clear.  No visible pleural effusions.  Cardiac silhouette upper normal in size to slightly enlarged for the AP portable technique, unchanged.  IMPRESSION: Severe COPD/emphysema.  No acute cardiopulmonary disease.  Stable borderline heart size.  Original Report Authenticated By: Arnell Sieving, M.D.   Dg Chest Portable 1 View  11/29/2010  *RADIOLOGY REPORT*  Clinical Data: Shortness of breath.  PORTABLE CHEST - 1 VIEW  Comparison: 03/18/2008.  Findings: COPD with hyperinflation.  Heart size upper limits normal.  Calcified tortuous aorta.  Normal osseous structures. Little change from priors.  IMPRESSION: COPD, no active infiltrates.  Original Report Authenticated By: Elsie Stain, M.D.    Lab Results: Results for orders placed during the hospital encounter of 12/06/10 (from the past 48 hour(s))  CBC     Status: Abnormal   Collection Time   12/06/10  5:36 PM      Component Value Range Comment   WBC 18.1 (*) 4.0 - 10.5 (K/uL)    RBC 4.02  3.87 - 5.11 (MIL/uL)    Hemoglobin 12.6  12.0 - 15.0 (g/dL)    HCT 40.9  81.1 - 91.4 (%)    MCV 96.5  78.0 - 100.0 (fL)    MCH 31.3  26.0 - 34.0 (pg)    MCHC 32.5  30.0 - 36.0 (g/dL)    RDW 78.2  95.6 - 21.3 (%)    Platelets 402 (*) 150 - 400 (K/uL)   BASIC METABOLIC PANEL     Status: Abnormal   Collection Time   12/06/10  5:36 PM      Component Value Range Comment   Sodium 133 (*) 135 - 145 (mEq/L)    Potassium 5.0  3.5 - 5.1 (mEq/L) DELTA CHECK NOTED   Chloride 89 (*) 96 - 112 (mEq/L)    CO2 38 (*) 19 - 32 (mEq/L)    Glucose, Bld 274 (*) 70 - 99 (mg/dL)    BUN 14  6 - 23 (mg/dL)    Creatinine, Ser 0.86  0.50 - 1.10 (mg/dL)    Calcium 9.0  8.4 - 10.5 (mg/dL)    GFR calc non Af Amer 89 (*) >90 (mL/min)    GFR calc Af Amer >90  >90 (mL/min)   BLOOD GAS, ARTERIAL     Status: Abnormal   Collection Time   12/06/10  6:15 PM      Component Value Range Comment   O2 Content 3.0      Delivery systems NASAL CANNULA      pH, Arterial 7.328 (*) 7.350 - 7.400     pCO2 arterial 70.0 (*) 35.0 - 45.0 (mmHg)    pO2, Arterial 109.0 (*) 80.0 - 100.0 (mmHg)    Bicarbonate 35.7 (*) 20.0 - 24.0 (mEq/L)    TCO2 32.6  0 - 100 (mmol/L)    Acid-Base Excess 9.7 (*) 0.0 - 2.0 (mmol/L)    O2 Saturation 97.6      Patient temperature 37.0      Collection site RIGHT RADIAL      Drawn by COLLECTED BY RT      Sample type ARTERIAL      Allens test (pass/fail) PASS  PASS    BLOOD GAS, ARTERIAL     Status: Abnormal   Collection Time   12/07/10 12:35 AM      Component Value Range Comment   O2 Content 4.0      Delivery systems BILEVEL POSITIVE AIRWAY PRESSURE      Inspiratory PAP 16.0  Expiratory PAP 8.0      pH, Arterial 7.395   7.350 - 7.400     pCO2 arterial 59.4 (*) 35.0 - 45.0 (mmHg)    pO2, Arterial 52.3 (*) 80.0 - 100.0 (mmHg)    Bicarbonate 35.6 (*) 20.0 - 24.0 (mEq/L)    TCO2 32.1  0 - 100 (mmol/L)    Acid-Base Excess 10.4 (*) 0.0 - 2.0 (mmol/L)    O2 Saturation 85.1      Patient temperature 37.0      Collection site RIGHT RADIAL      Drawn by [id number]      Sample type ARTERIAL      Allens test (pass/fail) PASS  PASS    BASIC METABOLIC PANEL     Status: Abnormal   Collection Time   12/07/10  6:12 AM      Component Value Range Comment   Sodium 135  135 - 145 (mEq/L)    Potassium 5.1  3.5 - 5.1 (mEq/L)    Chloride 90 (*) 96 - 112 (mEq/L)    CO2 38 (*) 19 - 32 (mEq/L)    Glucose, Bld 217 (*) 70 - 99 (mg/dL)    BUN 14  6 - 23 (mg/dL)    Creatinine, Ser 4.09  0.50 - 1.10 (mg/dL)    Calcium 9.2  8.4 - 10.5 (mg/dL)    GFR calc non Af Amer >90  >90 (mL/min)    GFR calc Af Amer >90  >90 (mL/min)   CBC     Status: Abnormal   Collection Time   12/07/10  6:12 AM      Component Value Range Comment   WBC 17.8 (*) 4.0 - 10.5 (K/uL)    RBC 3.64 (*) 3.87 - 5.11 (MIL/uL)    Hemoglobin 11.4 (*) 12.0 - 15.0 (g/dL)    HCT 81.1 (*) 91.4 - 46.0 (%)    MCV 95.3  78.0 - 100.0 (fL)    MCH 31.3  26.0 - 34.0 (pg)    MCHC 32.9  30.0 - 36.0 (g/dL)    RDW 78.2  95.6 - 21.3 (%)    Platelets 323  150 - 400 (K/uL)   GLUCOSE, CAPILLARY     Status: Abnormal   Collection Time   12/07/10  6:49 AM      Component Value Range Comment   Glucose-Capillary 206 (*) 70 - 99 (mg/dL)    Recent Results (from the past 240 hour(s))  MRSA PCR SCREENING     Status: Abnormal   Collection Time   11/30/10 12:22 AM      Component Value Range Status Comment   MRSA by PCR POSITIVE (*) NEGATIVE  Final      Hospital Course: This 65 year old lady was discharged from the hospital just yesterday and returned because her daughter was rather frustrated with lack of services when she went home. Her medical condition was rather unchanged and she is  clinically stable. However she has end-stage COPD and in the emergency room I discussed with the patient and the daughter regarding hospice services and hospice eligible to. Both the patient and her daughter are agreeable to hospice services who will come to the house today.  Discharge Exam: Blood pressure 174/92, pulse 115, temperature 98.5 F (36.9 C), temperature source Oral, resp. rate 18, height 5' (1.524 m), weight 72.576 kg (160 lb), SpO2 95.00%. Lung fields show bilateral wheezing which is her chronic state. She does not have any increased work of breathing and I  discharged her yesterday. She is in sinus rhythm. She is alert and orientated without any focal neurological signs.  Disposition: Home with hospice services.      SignedWilson Singer 12/07/2010, 11:41 AM

## 2010-12-07 NOTE — ED Notes (Signed)
Family sitting with pt. resp back in with pt. Social worker in with pt at present

## 2010-12-07 NOTE — ED Notes (Signed)
Patient is resting comfortably. - Sleeping

## 2010-12-07 NOTE — ED Notes (Signed)
Med given for back pain ?

## 2010-12-07 NOTE — ED Notes (Signed)
Sleeping , sinus rhythm

## 2010-12-07 NOTE — ED Notes (Signed)
Pt req pain med for back pain. Pt waiting for daughter to return to bring her home

## 2010-12-07 NOTE — ED Notes (Signed)
Pt eval by hospice nurse. Per Child psychotherapist, pt may be discharge home today with nurse

## 2010-12-07 NOTE — ED Notes (Signed)
Pt is to be discharged home. req a breathing tx before she is discharged

## 2010-12-07 NOTE — Progress Notes (Signed)
CSW received referral to assess home needs.  CM met with pt in ED and pt will go home with hospice.  Karn Cassis

## 2010-12-07 NOTE — ED Notes (Signed)
Unable to get colace  From supervisor.  She is unable to get from night cabinet.

## 2010-12-07 NOTE — ED Notes (Signed)
Pt awake now. R.T here to give breathing tx. Pt aware she is waiting to be admit

## 2011-12-17 ENCOUNTER — Ambulatory Visit: Payer: Medicare Other | Admitting: Urgent Care

## 2011-12-23 ENCOUNTER — Encounter: Payer: Self-pay | Admitting: Internal Medicine

## 2011-12-24 ENCOUNTER — Encounter: Payer: Self-pay | Admitting: Urgent Care

## 2011-12-24 ENCOUNTER — Ambulatory Visit (INDEPENDENT_AMBULATORY_CARE_PROVIDER_SITE_OTHER): Payer: Medicare Other | Admitting: Urgent Care

## 2011-12-24 VITALS — BP 124/68 | HR 60 | Temp 96.7°F | Ht 60.0 in | Wt 155.2 lb

## 2011-12-24 DIAGNOSIS — D126 Benign neoplasm of colon, unspecified: Secondary | ICD-10-CM

## 2011-12-24 NOTE — Patient Instructions (Addendum)
I will discuss colonoscopy with Dr Jena Gauss & call you back with further recommendations

## 2011-12-25 ENCOUNTER — Telehealth: Payer: Self-pay | Admitting: Urgent Care

## 2011-12-25 ENCOUNTER — Encounter: Payer: Self-pay | Admitting: Urgent Care

## 2011-12-25 ENCOUNTER — Other Ambulatory Visit: Payer: Self-pay | Admitting: Internal Medicine

## 2011-12-25 DIAGNOSIS — D369 Benign neoplasm, unspecified site: Secondary | ICD-10-CM

## 2011-12-25 MED ORDER — PEG 3350-KCL-NA BICARB-NACL 420 G PO SOLR: 4000.0000 mL | ORAL | Status: DC

## 2011-12-25 NOTE — Progress Notes (Signed)
Referring Provider: Vyas, Chandra Kant, MD Primary Care Physician:  VYAS,DHRUV B., MD Primary Gastroenterologist:  Dr. Rourk  Chief Complaint  Patient presents with  . Colonoscopy   HPI:  [name] is a 66 y.o. female here as a referral from Dr. Vyas to set up a surveillance colonoscopy. 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 She also had sigmoid colectomy in 2001 for diverticulitis.  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  Last colonoscopy was done in [place] 2001 with multiple benign polyps.  She remains on chronic inhalers and oxygen for her COPD.  She denies abdominal pain, constipation, diarrhea, rectal bleeding, melena or weight loss.   Denies heartburn, indigestion, nausea, vomiting, dysphagia, odynophagia or anorexia.  Past Medical History  Diagnosis Date  . GERD (gastroesophageal reflux disease)   . Allergic rhinitis   . Pneumonia   . Hypertension   . Diabetes mellitus, type 2   . 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 Duodenal ulcer   . Hiatal hernia   . Diverticulosis   . IBS (irritable bowel syndrome)   . Diverticulitis 2001  . Anxiety   . Chronic airway obstruction, not elsewhere classified   . 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 CHF (congestive heart failure)     Past Surgical History  Procedure Date  . Cesarean section 1971  . Abdominal hysterectomy 1980  . Colectomy 07/1999    Sigmoid-chronic diverticulitis  . Rod in left leg   . Cholecystectomy     ERCP sphincterotomy, stone extraction 2006  . Endoscopic retrograde cholangiopancreatography (ercp) with propofol 11/06/2004    RMR: Normal appearing ampulla/Mildly diffusely dilated biliary tree/ status post sphincterotomy and stone extraction as  . Esophagogastroduodenoscopy 09/1999    [place]: Gastritis, mild duodenitis  . Colonoscopy 05/1999    [place]: Multiple colonic polys (benign polypoid mucosa),  very tortuous sigmoid colon    Current Outpatient Prescriptions  Medication Sig Dispense Refill  . albuterol (PROAIR HFA) 108 (90 BASE) MCG/ACT inhaler Inhale 2 puffs into the lungs 4 (four) times daily.        . albuterol (PROVENTIL) (2.5 MG/3ML) 0.083% nebulizer solution Take 2.5 mg by nebulization every 6 (six) hours as needed. For shortness of breath       . ATROVENT HFA 17 MCG/ACT inhaler Take 3 mLs by nebulization 4 (four) times daily as needed.      . budesonide-formoterol (SYMBICORT) 160-4.5 MCG/ACT inhaler Inhale 2 puffs into the lungs 2 (two) times daily.        . cetirizine (ZYRTEC) 10 MG tablet Take 10 mg by mouth at bedtime.       . diazepam (VALIUM) 10 MG tablet Take 10 mg by mouth every 12 (twelve) hours as needed. For anxiety      . fentaNYL (DURAGESIC - DOSED MCG/HR) 25 MCG/HR Place 1 patch onto the skin every 3 (three) days.      . FLUoxetine (PROZAC) 20 MG capsule Take 60 mg by mouth daily.       . hydrochlorothiazide 25 MG tablet Take 12.5 mg by mouth at bedtime.       . lisinopril (PRINIVIL,ZESTRIL) 20 MG tablet Take 20 mg by mouth at bedtime.        . OLANZapine (ZYPREXA) 10 MG tablet Take 10 mg by mouth at bedtime.        . omeprazole (PRILOSEC) 20 MG capsule   Take 20 mg by mouth daily.        . oxyCODONE-acetaminophen (PERCOCET/ROXICET) 5-325 MG per tablet Take 1 tablet by mouth 3 (three) times daily as needed.      . pseudoephedrine-acetaminophen (TYLENOL SINUS) 30-500 MG TABS Take 1 tablet by mouth every 4 (four) hours as needed. For symptoms      . theophylline (THEO-24) 200 MG 24 hr capsule Take 200 mg by mouth 2 (two) times daily.      . tiotropium (SPIRIVA) 18 MCG inhalation capsule Place 18 mcg into inhaler and inhale daily.          Allergies as of 12/24/2011 - Review Complete 12/24/2011  Allergen Reaction Noted  . Nsaids  08/01/2010    Family History:There is no known family history of colorectal carcinoma , liver disease, or inflammatory bowel disease.   Problem Relation Age of Onset  . Aneurysm Mother   . Lung cancer Father   . Diabetes Sister   . Diabetes Sister     History   Social History  . Marital Status: Married    Spouse Name: N/A    Number of Children: 2  . Years of Education: N/A   Occupational History  . disabled    Social History Main Topics  . Smoking status: Former Smoker -- 0.5 packs/day    Types: Cigarettes    Quit date: 07/01/2010  . Smokeless tobacco: Not on file     Comment: Quit x 1.5 years  . Alcohol Use: No  . Drug Use: No  . Sexually Active: Not on file  Review of Systems: Gen: Denies any fever, chills, sweats, anorexia, fatigue, weakness, malaise, weight loss, and sleep disorder CV: Denies chest pain, angina, palpitations, syncope, orthopnea, PND, peripheral edema, and claudication. Resp: +chronic dyspnea, chronic nonproductive cough GI: Denies vomiting blood, jaundice, and fecal incontinence.    GU : Denies urinary burning, blood in urine, urinary frequency, urinary hesitancy, nocturnal urination, and urinary incontinence. MS: Denies joint pain, limitation of movement, and swelling, stiffness, low back pain, extremity pain. Denies muscle weakness, cramps, atrophy.  Derm: Denies rash, itching, dry skin, hives, moles, warts, or unhealing ulcers.  Psych: Denies depression, anxiety, memory loss, suicidal ideation, hallucinations, paranoia, and confusion. Heme: Denies bruising, bleeding, and enlarged lymph nodes. Neuro:  Denies any headaches, dizziness, paresthesias. Endo:  Denies any problems with DM, thyroid, adrenal function.  Physical Exam: BP 124/68  Pulse 60  Temp 96.7 F (35.9 C) (Tympanic)  Ht 5' (1.524 m)  Wt 155 lb 3.2 oz (70.398 kg)  BMI 30.31 kg/m2  SpO2 93% No LMP recorded. Patient has had a hysterectomy. General:   Alert,  Well-developed, chronically ill appearing caucasian female who is very pleasant and cooperative in NAD Head:  Normocephalic and atraumatic. Eyes:  Sclera  clear, no icterus.   Conjunctiva pink. Ears:  Normal auditory acuity. Nose:  No deformity, discharge, or lesions.  O2 3LPM via [place]. Mouth:  No deformity or lesions,oropharynx pink & moist. Neck:  Supple; no masses or thyromegaly. Lungs: +insp/exp mild wheezing.  NAD.     Heart:  Regular rate and rhythm; no murmurs, clicks, rubs,  or gallops. Abdomen:  Normal bowel sounds.  No bruits.  Soft, non-tender and non-distended without masses, hepatosplenomegaly or hernias noted.  No guarding or rebound tenderness.   Rectal:  Deferred.  Msk:  Symmetrical without gross deformities. Normal posture. Pulses:  Normal pulses noted. Extremities:+ clubbing.  No edema. Neurologic:  Alert and oriented x4;  grossly normal   neurologically. Skin:  Intact without significant lesions or rashes. Lymph Nodes:  No significant cervical adenopathy. Psych:  Alert and cooperative. Normal mood and affect.  

## 2011-12-25 NOTE — Telephone Encounter (Signed)
I discussed sedation concerns given patient's history of oxygen-dependent severe COPD with Dr. Jena Gauss. He feels light sedation can be used with colonoscopy & benefits outweigh the risks at this point. Please arrange surveillance colonoscopy with Dr. Jena Gauss in endo reason: History tubulovillous adenoma

## 2011-12-26 ENCOUNTER — Encounter: Payer: Self-pay | Admitting: Urgent Care

## 2011-12-26 ENCOUNTER — Other Ambulatory Visit: Payer: Self-pay | Admitting: Internal Medicine

## 2011-12-26 NOTE — Assessment & Plan Note (Signed)
[name] is a pleasant 66 y.o. female with hx tubulovillous adenoma of colon in 1998 who is overdue for surveillance colonoscopy.  I discussed with Dr Jena Gauss given her severe O2-dependent COPD & risks involved.  He has offered complete colonoscopy under light sedation with monitoring & feels the benefits may outweigh the risks at this point.  I have discussed risks & benefits which include, but are not limited to, bleeding, infection, perforation & drug reaction.  The patient agrees with this plan & written consent will be obtained.

## 2011-12-26 NOTE — Progress Notes (Signed)
Faxed to PCP

## 2011-12-26 NOTE — Telephone Encounter (Signed)
Patient is scheduled w/RMR for TCS on Monday Dec 9th at 10:30 and I have mailed her instructions and she is aware

## 2011-12-31 ENCOUNTER — Encounter (HOSPITAL_COMMUNITY): Payer: Self-pay | Admitting: Pharmacy Technician

## 2012-01-13 ENCOUNTER — Encounter (HOSPITAL_COMMUNITY): Payer: Self-pay | Admitting: *Deleted

## 2012-01-13 ENCOUNTER — Ambulatory Visit (HOSPITAL_COMMUNITY)
Admission: RE | Admit: 2012-01-13 | Discharge: 2012-01-13 | Disposition: A | Discharge status: Home / Self Care | Payer: Medicare Other | Source: Ambulatory Visit | Attending: Internal Medicine | Admitting: Internal Medicine

## 2012-01-13 ENCOUNTER — Encounter (HOSPITAL_COMMUNITY)
Admission: RE | Disposition: A | Discharge status: Home / Self Care | Payer: Self-pay | Source: Ambulatory Visit | Attending: Internal Medicine

## 2012-01-13 DIAGNOSIS — D126 Benign neoplasm of colon, unspecified: Secondary | ICD-10-CM

## 2012-01-13 DIAGNOSIS — D369 Benign neoplasm, unspecified site: Secondary | ICD-10-CM

## 2012-01-13 DIAGNOSIS — I10 Essential (primary) hypertension: Secondary | ICD-10-CM | POA: Insufficient documentation

## 2012-01-13 DIAGNOSIS — J4489 Other specified chronic obstructive pulmonary disease: Secondary | ICD-10-CM | POA: Insufficient documentation

## 2012-01-13 DIAGNOSIS — E119 Type 2 diabetes mellitus without complications: Secondary | ICD-10-CM | POA: Insufficient documentation

## 2012-01-13 DIAGNOSIS — Z86010 Personal history of colonic polyps: Secondary | ICD-10-CM

## 2012-01-13 DIAGNOSIS — Z1211 Encounter for screening for malignant neoplasm of colon: Secondary | ICD-10-CM

## 2012-01-13 DIAGNOSIS — J449 Chronic obstructive pulmonary disease, unspecified: Secondary | ICD-10-CM | POA: Insufficient documentation

## 2012-01-13 HISTORY — PX: COLONOSCOPY: SHX5424

## 2012-01-13 LAB — GLUCOSE, CAPILLARY: Glucose-Capillary: 138 mg/dL — ABNORMAL HIGH (ref 70–99)

## 2012-01-13 SURGERY — COLONOSCOPY
Anesthesia: Moderate Sedation

## 2012-01-13 MED ORDER — MIDAZOLAM HCL 5 MG/5ML IJ SOLN
INTRAMUSCULAR | Status: AC
  Filled 2012-01-13: qty 10

## 2012-01-13 MED ORDER — STERILE WATER FOR IRRIGATION IR SOLN
Status: DC | PRN
  Administered 2012-01-13: 14:00:00

## 2012-01-13 MED ORDER — MEPERIDINE HCL 100 MG/ML IJ SOLN
INTRAMUSCULAR | Status: AC
  Filled 2012-01-13: qty 2

## 2012-01-13 MED ORDER — MIDAZOLAM HCL 5 MG/5ML IJ SOLN
INTRAMUSCULAR | Status: DC | PRN
  Administered 2012-01-13 (×2): 1 mg via INTRAVENOUS
  Administered 2012-01-13: 2 mg via INTRAVENOUS
  Administered 2012-01-13 (×2): 1 mg via INTRAVENOUS

## 2012-01-13 MED ORDER — MEPERIDINE HCL 100 MG/ML IJ SOLN
INTRAMUSCULAR | Status: DC | PRN
  Administered 2012-01-13 (×2): 25 mg via INTRAVENOUS
  Administered 2012-01-13: 50 mg via INTRAVENOUS

## 2012-01-13 MED ORDER — SODIUM CHLORIDE 0.45 % IV SOLN: INTRAVENOUS | Status: DC

## 2012-01-13 NOTE — Op Note (Signed)
[facility] [address] [place], [address]   COLONOSCOPY PROCEDURE REPORT  PATIENT: [name], [name]  MR#:         [id number] BIRTHDATE: 01-10-1946 , 66  yrs. old GENDER: Female ENDOSCOPIST: R.  Roetta Sessions, MD FACP FACG REFERRED BY:  Doreen Beam, M.D. PROCEDURE DATE:  01/13/2012 PROCEDURE:     colonoscopy with polyp ablation sterile polypectomy  INDICATIONS: history of high-grade adenomas on prior, and distant colonoscopy  INFORMED CONSENT:  The risks, benefits, alternatives and imponderables including but not limited to bleeding, perforation as well as the possibility of a missed lesion have been reviewed.  The potential for biopsy, lesion removal, etc. have also been discussed.  Questions have been answered.  All parties agreeable. Please see the history and physical in the medical record for more information.  MEDICATIONS: Versed 6 mg IV and Demerol 100 mg IV in divided doses.  DESCRIPTION OF PROCEDURE:  After a digital rectal exam was performed, the EC-[id number] ([id number])  colonoscope was advanced from the anus through the rectum and colon to the area of the cecum, ileocecal valve and appendiceal orifice.  The cecum was deeply intubated.  These structures were well-seen and photographed for the record.  From the level of the cecum and ileocecal valve, the scope was slowly and cautiously withdrawn.  The mucosal surfaces were carefully surveyed utilizing scope tip deflection to facilitate fold flattening as needed.  The scope was pulled down into the rectum where a thorough examination including retroflexion was performed.    FINDINGS:  inadequate preparation. Greasy effluent throughout the colon. Also stool residue on the right made exam more difficult. In addition,  intermittent scope failure with image loss, particularly while surveying the right side made the exam more challenging.  THERAPEUTIC / DIAGNOSTIC MANEUVERS PERFORMED:   multiple polyps  in the ascending and descending segments. There were  also a couple of diminutive polyps the descending segment. Location of prior anastomosis from sigmoid resection surgery previously inapparent on todays examination.  COMPLICATIONS: None  CECAL WITHDRAWAL TIME:  16 minutes  IMPRESSION:  Multiple colonic polyps treated/removed as described above. Poor prep and technical difficulties making exam more difficult. The patient may have persisted polyps not seen on today's examination.  RECOMMENDATIONS: Followup on pathology. Consider early followup interval colonoscopy (i.e. one year for now)   _______________________________ eSigned:  R. Roetta Sessions, MD FACP [facility] 01/13/2012 3:04 PM   CC:

## 2012-01-13 NOTE — Interval H&P Note (Signed)
History and Physical Interval Note:  01/13/2012 2:02 PM  [name]  has presented today for surgery, with the diagnosis of History of tubulovillous adenoma  The various methods of treatment have been discussed with the patient and family. After consideration of risks, benefits and other options for treatment, the patient has consented to  Procedure(s) (LRB) with comments: COLONOSCOPY (N/A) - 10:30 as a surgical intervention .  The patient's history has been reviewed, patient examined, no change in status, stable for surgery.  I have reviewed the patient's chart and labs.  Questions were answered to the patient's satisfaction.     Eula Listen  As above. Colonoscopy per plan.The risks, benefits, limitations, alternatives and imponderables have been reviewed with the patient. Questions have been answered. All parties are agreeable.

## 2012-01-13 NOTE — OR Nursing (Signed)
Patient in for a TCS. BP 184/110 in right arm, 159/119 in left arm, pulse 111.  Dr. Jena Gauss notified. Will monitor patient during the procedure. Repeated BP 5 min later and was 173/93.

## 2012-01-13 NOTE — H&P (View-Only) (Signed)
Referring Provider: Darrow Bussing, MD Primary Care Physician:  Ignatius Specking., MD Primary Gastroenterologist:  Dr. Jena Gauss  Chief Complaint  Patient presents with  . Colonoscopy   HPI:  [name] is a 66 y.o. female here as a referral from Dr. Sherril Croon to set up a surveillance colonoscopy. 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 She also had sigmoid colectomy in 2001 for diverticulitis.  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  Last colonoscopy was done in [facility] [place] 2001 with multiple benign polyps.  She remains on chronic inhalers and oxygen for her COPD.  She denies abdominal pain, constipation, diarrhea, rectal bleeding, melena or weight loss.   Denies heartburn, indigestion, nausea, vomiting, dysphagia, odynophagia or anorexia.  Past Medical History  Diagnosis Date  . GERD (gastroesophageal reflux disease)   . Allergic rhinitis   . Pneumonia   . Hypertension   . Diabetes mellitus, type 2   . 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 Duodenal ulcer   . Hiatal hernia   . Diverticulosis   . IBS (irritable bowel syndrome)   . Diverticulitis 2001  . Anxiety   . Chronic airway obstruction, not elsewhere classified   . 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 CHF (congestive heart failure)     Past Surgical History  Procedure Date  . Cesarean section 1971  . Abdominal hysterectomy 1980  . Colectomy 07/1999    Sigmoid-chronic diverticulitis  . Rod in left leg   . Cholecystectomy     ERCP sphincterotomy, stone extraction 2006  . Endoscopic retrograde cholangiopancreatography (ercp) with propofol 11/06/2004    RMR: Normal appearing ampulla/Mildly diffusely dilated biliary tree/ status post sphincterotomy and stone extraction as  . Esophagogastroduodenoscopy 09/1999    [place]: Gastritis, mild duodenitis  . Colonoscopy 05/1999    [place]: Multiple colonic polys (benign polypoid mucosa),  very tortuous sigmoid colon    Current Outpatient Prescriptions  Medication Sig Dispense Refill  . albuterol (PROAIR HFA) 108 (90 BASE) MCG/ACT inhaler Inhale 2 puffs into the lungs 4 (four) times daily.        Marland Kitchen albuterol (PROVENTIL) (2.5 MG/3ML) 0.083% nebulizer solution Take 2.5 mg by nebulization every 6 (six) hours as needed. For shortness of breath       . ATROVENT HFA 17 MCG/ACT inhaler Take 3 mLs by nebulization 4 (four) times daily as needed.      . budesonide-formoterol (SYMBICORT) 160-4.5 MCG/ACT inhaler Inhale 2 puffs into the lungs 2 (two) times daily.        . cetirizine (ZYRTEC) 10 MG tablet Take 10 mg by mouth at bedtime.       . diazepam (VALIUM) 10 MG tablet Take 10 mg by mouth every 12 (twelve) hours as needed. For anxiety      . fentaNYL (DURAGESIC - DOSED MCG/HR) 25 MCG/HR Place 1 patch onto the skin every 3 (three) days.      Marland Kitchen FLUoxetine (PROZAC) 20 MG capsule Take 60 mg by mouth daily.       . hydrochlorothiazide 25 MG tablet Take 12.5 mg by mouth at bedtime.       Marland Kitchen lisinopril (PRINIVIL,ZESTRIL) 20 MG tablet Take 20 mg by mouth at bedtime.        Marland Kitchen OLANZapine (ZYPREXA) 10 MG tablet Take 10 mg by mouth at bedtime.        Marland Kitchen omeprazole (PRILOSEC) 20 MG capsule  Take 20 mg by mouth daily.        Marland Kitchen oxyCODONE-acetaminophen (PERCOCET/ROXICET) 5-325 MG per tablet Take 1 tablet by mouth 3 (three) times daily as needed.      . pseudoephedrine-acetaminophen (TYLENOL SINUS) 30-500 MG TABS Take 1 tablet by mouth every 4 (four) hours as needed. For symptoms      . theophylline (THEO-24) 200 MG 24 hr capsule Take 200 mg by mouth 2 (two) times daily.      Marland Kitchen tiotropium (SPIRIVA) 18 MCG inhalation capsule Place 18 mcg into inhaler and inhale daily.          Allergies as of 12/24/2011 - Review Complete 12/24/2011  Allergen Reaction Noted  . Nsaids  08/01/2010    Family History:There is no known family history of colorectal carcinoma , liver disease, or inflammatory bowel disease.   Problem Relation Age of Onset  . Aneurysm Mother   . Lung cancer Father   . Diabetes Sister   . Diabetes Sister     History   Social History  . Marital Status: Married    Spouse Name: N/A    Number of Children: 2  . Years of Education: N/A   Occupational History  . disabled    Social History Main Topics  . Smoking status: Former Smoker -- 0.5 packs/day    Types: Cigarettes    Quit date: 07/01/2010  . Smokeless tobacco: Not on file     Comment: Quit x 1.5 years  . Alcohol Use: No  . Drug Use: No  . Sexually Active: Not on file  Review of Systems: Gen: Denies any fever, chills, sweats, anorexia, fatigue, weakness, malaise, weight loss, and sleep disorder CV: Denies chest pain, angina, palpitations, syncope, orthopnea, PND, peripheral edema, and claudication. Resp: +chronic dyspnea, chronic nonproductive cough GI: Denies vomiting blood, jaundice, and fecal incontinence.    GU : Denies urinary burning, blood in urine, urinary frequency, urinary hesitancy, nocturnal urination, and urinary incontinence. MS: Denies joint pain, limitation of movement, and swelling, stiffness, low back pain, extremity pain. Denies muscle weakness, cramps, atrophy.  Derm: Denies rash, itching, dry skin, hives, moles, warts, or unhealing ulcers.  Psych: Denies depression, anxiety, memory loss, suicidal ideation, hallucinations, paranoia, and confusion. Heme: Denies bruising, bleeding, and enlarged lymph nodes. Neuro:  Denies any headaches, dizziness, paresthesias. Endo:  Denies any problems with DM, thyroid, adrenal function.  Physical Exam: BP 124/68  Pulse 60  Temp 96.7 F (35.9 C) (Tympanic)  Ht 5' (1.524 m)  Wt 155 lb 3.2 oz (70.398 kg)  BMI 30.31 kg/m2  SpO2 93% No LMP recorded. Patient has had a hysterectomy. General:   Alert,  Well-developed, chronically ill appearing caucasian female who is very pleasant and cooperative in NAD Head:  Normocephalic and atraumatic. Eyes:  Sclera  clear, no icterus.   Conjunctiva pink. Ears:  Normal auditory acuity. Nose:  No deformity, discharge, or lesions.  O2 3LPM via [place]. Mouth:  No deformity or lesions,oropharynx pink & moist. Neck:  Supple; no masses or thyromegaly. Lungs: +insp/exp mild wheezing.  NAD.     Heart:  Regular rate and rhythm; no murmurs, clicks, rubs,  or gallops. Abdomen:  Normal bowel sounds.  No bruits.  Soft, non-tender and non-distended without masses, hepatosplenomegaly or hernias noted.  No guarding or rebound tenderness.   Rectal:  Deferred.  Msk:  Symmetrical without gross deformities. Normal posture. Pulses:  Normal pulses noted. Extremities:+ clubbing.  No edema. Neurologic:  Alert and oriented x4;  grossly normal  neurologically. Skin:  Intact without significant lesions or rashes. Lymph Nodes:  No significant cervical adenopathy. Psych:  Alert and cooperative. Normal mood and affect.

## 2012-01-16 ENCOUNTER — Encounter: Payer: Self-pay | Admitting: *Deleted

## 2012-01-16 ENCOUNTER — Encounter: Payer: Self-pay | Admitting: Internal Medicine

## 2012-01-17 ENCOUNTER — Encounter (HOSPITAL_COMMUNITY): Payer: Self-pay | Admitting: Internal Medicine

## 2013-01-14 ENCOUNTER — Encounter: Payer: Self-pay | Admitting: Internal Medicine

## 2013-07-22 NOTE — Patient Instructions (Signed)
Your procedure is scheduled on:  08/02/13  Report to [facility] at 06:15 AM.  Call this number if you have problems the morning of surgery: [phone]   Remember:   Do not eat food or drink liquids after midnight.   Take these medicines the morning of surgery with A SIP OF WATER: Fluozetine, Diazepam, Fentanyl patch, Lisinopril, Omeprazole and Theophylline. Use your Spiriva, Symbicort, Albuterol, Atrovent and Dulera inhalers also.   Do not wear jewelry, make-up or nail polish.  Do not wear lotions, powders, or perfumes. You may wear deodorant.  Do not bring valuables to the hospital.  [facility] is not responsible for any belongings or valuables.               Contacts, dentures or bridgework may not be worn into surgery.               Patients discharged the day of surgery will not be allowed to drive home.   Special Instructions: Start using your eye drops prior to surgery as directed by your eye doctor.   Please read over the following fact sheets that you were given: Anesthesia Post-op Instructions and Care and Recovery After Surgery     Cataract Surgery  A cataract is a clouding of the lens of the eye. When a lens becomes cloudy, vision is reduced based on the degree and nature of the clouding. Surgery may be needed to improve vision. Surgery removes the cloudy lens and usually replaces it with a substitute lens (intraocular lens, IOL). LET YOUR EYE DOCTOR KNOW ABOUT:  Allergies to food or medicine.  Medicines taken including herbs, eyedrops, over-the-counter medicines, and creams.  Use of steroids (by mouth or creams).  Previous problems with anesthetics or numbing medicine.  History of bleeding problems or blood clots.  Previous surgery.  Other health problems, including diabetes and kidney problems.  Possibility of pregnancy, if this applies. RISKS AND COMPLICATIONS  Infection.  Inflammation of the eyeball (endophthalmitis) that can spread to both eyes  (sympathetic ophthalmia).  Poor wound healing.  If an IOL is inserted, it can later fall out of proper position. This is very uncommon.  Clouding of the part of your eye that holds an IOL in place. This is called an "after-cataract." These are uncommon, but easily treated. BEFORE THE PROCEDURE  Do not eat or drink anything except small amounts of water for 8 to 12 before your surgery, or as directed by your caregiver.  Unless you are told otherwise, continue any eyedrops you have been prescribed.  Talk to your primary caregiver about all other medicines that you take (both prescription and non-prescription). In some cases, you may need to stop or change medicines near the time of your surgery. This is most important if you are taking blood-thinning medicine.Do not stop medicines unless you are told to do so.  Arrange for someone to drive you to and from the procedure.  Do not put contact lenses in either eye on the day of your surgery. PROCEDURE There is more than one method for safely removing a cataract. Your doctor can explain the differences and help determine which is best for you. Phacoemulsification surgery is the most common form of cataract surgery.  An injection is given behind the eye or eyedrops are given to make this a painless procedure.  A small cut (incision) is made on the edge of the clear, dome-shaped surface that covers the front of the eye (cornea).  A tiny probe is  painlessly inserted into the eye. This device gives off ultrasound waves that soften and break up the cloudy center of the lens. This makes it easier for the cloudy lens to be removed by suction.  An IOL may be implanted.  The normal lens of the eye is covered by a clear capsule. Part of that capsule is intentionally left in the eye to support the IOL.  Your surgeon may or may not use stitches to close the incision. There are other forms of cataract surgery that require a larger incision and stiches  to close the eye. This approach is taken in cases where the doctor feels that the cataract cannot be easily removed using phacoemulsification. AFTER THE PROCEDURE  When an IOL is implanted, it does not need care. It becomes a permanent part of your eye and cannot be seen or felt.  Your doctor will schedule follow-up exams to check on your progress.  Review your other medicines with your doctor to see which can be resumed after surgery.  Use eyedrops or take medicine as prescribed by your doctor. Document Released: 01/10/2011 Document Revised: 04/15/2011 Document Reviewed: 01/10/2011 [facility] Patient Information 2013 [employer].    PATIENT INSTRUCTIONS POST-ANESTHESIA  IMMEDIATELY FOLLOWING SURGERY:  Do not drive or operate machinery for the first twenty four hours after surgery.  Do not make any important decisions for twenty four hours after surgery or while taking narcotic pain medications or sedatives.  If you develop intractable nausea and vomiting or a severe headache please notify your doctor immediately.  FOLLOW-UP:  Please make an appointment with your surgeon as instructed. You do not need to follow up with anesthesia unless specifically instructed to do so.  WOUND CARE INSTRUCTIONS (if applicable):  Keep a dry clean dressing on the anesthesia/puncture wound site if there is drainage.  Once the wound has quit draining you may leave it open to air.  Generally you should leave the bandage intact for twenty four hours unless there is drainage.  If the epidural site drains for more than 36-48 hours please call the anesthesia department.  QUESTIONS?:  Please feel free to call your physician or the hospital operator if you have any questions, and they will be happy to assist you.

## 2013-07-23 ENCOUNTER — Encounter (HOSPITAL_COMMUNITY): Payer: Self-pay | Admitting: Pharmacy Technician

## 2013-07-23 ENCOUNTER — Encounter (HOSPITAL_COMMUNITY): Payer: Self-pay

## 2013-07-23 ENCOUNTER — Other Ambulatory Visit: Payer: Self-pay

## 2013-07-23 ENCOUNTER — Encounter (HOSPITAL_COMMUNITY)
Admission: RE | Admit: 2013-07-23 | Discharge: 2013-07-23 | Disposition: A | Discharge status: Home / Self Care | Payer: Medicare Other | Source: Ambulatory Visit | Attending: Ophthalmology | Admitting: Ophthalmology

## 2013-07-23 DIAGNOSIS — Z01810 Encounter for preprocedural cardiovascular examination: Secondary | ICD-10-CM | POA: Insufficient documentation

## 2013-07-23 DIAGNOSIS — Z01812 Encounter for preprocedural laboratory examination: Secondary | ICD-10-CM | POA: Insufficient documentation

## 2013-07-23 DIAGNOSIS — Z01818 Encounter for other preprocedural examination: Secondary | ICD-10-CM | POA: Insufficient documentation

## 2013-07-23 LAB — BASIC METABOLIC PANEL
BUN: 12 mg/dL (ref 6–23)
CO2: 30 meq/L (ref 19–32)
Calcium: 9.8 mg/dL (ref 8.4–10.5)
Chloride: 94 mEq/L — ABNORMAL LOW (ref 96–112)
Creatinine, Ser: 0.92 mg/dL (ref 0.50–1.10)
GFR calc Af Amer: 72 mL/min — ABNORMAL LOW (ref >90)
GFR, EST NON AFRICAN AMERICAN: 63 mL/min — AB (ref >90)
GLUCOSE: 204 mg/dL — AB (ref 70–99)
POTASSIUM: 4.9 meq/L (ref 3.7–5.3)
SODIUM: 141 meq/L (ref 137–147)

## 2013-07-23 LAB — SURGICAL PCR SCREEN
MRSA, PCR: NEGATIVE
Staphylococcus aureus: POSITIVE — AB

## 2013-07-23 LAB — HEMOGLOBIN AND HEMATOCRIT, BLOOD
HEMATOCRIT: 39.2 % (ref 36.0–46.0)
Hemoglobin: 12.9 g/dL (ref 12.0–15.0)

## 2013-07-23 NOTE — Pre-Procedure Instructions (Signed)
Patient given information to sign up for my chart at home. 

## 2013-08-02 ENCOUNTER — Encounter (HOSPITAL_COMMUNITY): Payer: Medicare Other | Admitting: Anesthesiology

## 2013-08-02 ENCOUNTER — Ambulatory Visit (HOSPITAL_COMMUNITY)
Admission: RE | Admit: 2013-08-02 | Discharge: 2013-08-02 | Disposition: A | Discharge status: Home / Self Care | Payer: Medicare Other | Source: Ambulatory Visit | Attending: Ophthalmology | Admitting: Ophthalmology

## 2013-08-02 ENCOUNTER — Encounter (HOSPITAL_COMMUNITY)
Admission: RE | Disposition: A | Discharge status: Home / Self Care | Payer: Self-pay | Source: Ambulatory Visit | Attending: Ophthalmology

## 2013-08-02 ENCOUNTER — Encounter (HOSPITAL_COMMUNITY): Payer: Self-pay | Admitting: Ophthalmology

## 2013-08-02 ENCOUNTER — Ambulatory Visit (HOSPITAL_COMMUNITY): Payer: Medicare Other | Admitting: Anesthesiology

## 2013-08-02 DIAGNOSIS — J449 Chronic obstructive pulmonary disease, unspecified: Secondary | ICD-10-CM | POA: Diagnosis not present

## 2013-08-02 DIAGNOSIS — E119 Type 2 diabetes mellitus without complications: Secondary | ICD-10-CM | POA: Diagnosis not present

## 2013-08-02 DIAGNOSIS — F411 Generalized anxiety disorder: Secondary | ICD-10-CM | POA: Insufficient documentation

## 2013-08-02 DIAGNOSIS — H2510 Age-related nuclear cataract, unspecified eye: Secondary | ICD-10-CM | POA: Diagnosis not present

## 2013-08-02 DIAGNOSIS — Z87891 Personal history of nicotine dependence: Secondary | ICD-10-CM | POA: Diagnosis not present

## 2013-08-02 DIAGNOSIS — Z79899 Other long term (current) drug therapy: Secondary | ICD-10-CM | POA: Diagnosis not present

## 2013-08-02 DIAGNOSIS — I10 Essential (primary) hypertension: Secondary | ICD-10-CM | POA: Insufficient documentation

## 2013-08-02 DIAGNOSIS — K219 Gastro-esophageal reflux disease without esophagitis: Secondary | ICD-10-CM | POA: Diagnosis not present

## 2013-08-02 DIAGNOSIS — J4489 Other specified chronic obstructive pulmonary disease: Secondary | ICD-10-CM | POA: Insufficient documentation

## 2013-08-02 HISTORY — PX: CATARACT EXTRACTION W/PHACO: SHX586

## 2013-08-02 LAB — GLUCOSE, CAPILLARY: GLUCOSE-CAPILLARY: 182 mg/dL — AB (ref 70–99)

## 2013-08-02 SURGERY — PHACOEMULSIFICATION, CATARACT, WITH IOL INSERTION
Anesthesia: Monitor Anesthesia Care | Site: Eye | Laterality: Left

## 2013-08-02 MED ORDER — LIDOCAINE HCL 3.5 % OP GEL
OPHTHALMIC | Status: DC | PRN
  Administered 2013-08-02: 1 via OPHTHALMIC

## 2013-08-02 MED ORDER — CYCLOPENTOLATE-PHENYLEPHRINE 0.2-1 % OP SOLN
1.0000 [drp] | OPHTHALMIC | Status: DC
  Administered 2013-08-02 (×3): 1 [drp] via OPHTHALMIC

## 2013-08-02 MED ORDER — MIDAZOLAM HCL 2 MG/2ML IJ SOLN
INTRAMUSCULAR | Status: AC
  Filled 2013-08-02: qty 2

## 2013-08-02 MED ORDER — FENTANYL CITRATE 0.05 MG/ML IJ SOLN
25.0000 ug | INTRAMUSCULAR | Status: AC
  Administered 2013-08-02 (×2): 25 ug via INTRAVENOUS

## 2013-08-02 MED ORDER — LACTATED RINGERS IV SOLN
INTRAVENOUS | Status: DC
  Administered 2013-08-02: 1000 mL via INTRAVENOUS

## 2013-08-02 MED ORDER — NA HYALUR & NA CHOND-NA HYALUR 0.55-0.5 ML IO KIT
PACK | INTRAOCULAR | Status: DC | PRN
  Administered 2013-08-02: 1 via OPHTHALMIC

## 2013-08-02 MED ORDER — POVIDONE-IODINE 5 % OP SOLN
OPHTHALMIC | Status: DC | PRN
  Administered 2013-08-02: 1 via OPHTHALMIC

## 2013-08-02 MED ORDER — EPINEPHRINE HCL 1 MG/ML IJ SOLN
INTRAMUSCULAR | Status: AC
  Filled 2013-08-02: qty 1

## 2013-08-02 MED ORDER — MIDAZOLAM HCL 2 MG/2ML IJ SOLN
1.0000 mg | INTRAMUSCULAR | Status: DC | PRN
  Administered 2013-08-02 (×2): 1 mg via INTRAVENOUS

## 2013-08-02 MED ORDER — EPINEPHRINE HCL 1 MG/ML IJ SOLN
INTRAMUSCULAR | Status: DC | PRN
  Administered 2013-08-02: 08:00:00

## 2013-08-02 MED ORDER — FENTANYL CITRATE 0.05 MG/ML IJ SOLN
INTRAMUSCULAR | Status: AC
  Filled 2013-08-02: qty 2

## 2013-08-02 MED ORDER — TETRACAINE HCL 0.5 % OP SOLN
1.0000 [drp] | OPHTHALMIC | Status: AC
  Administered 2013-08-02 (×3): 1 [drp] via OPHTHALMIC

## 2013-08-02 MED ORDER — BSS IO SOLN
INTRAOCULAR | Status: DC | PRN
  Administered 2013-08-02: 15 mL via INTRAOCULAR

## 2013-08-02 MED ORDER — LIDOCAINE HCL 3.5 % OP GEL
OPHTHALMIC | Status: AC
Start: 2013-08-02 — End: 2013-08-02
  Filled 2013-08-02: qty 1

## 2013-08-02 MED ORDER — TETRACAINE 0.5 % OP SOLN OPTIME - NO CHARGE
OPHTHALMIC | Status: DC | PRN
  Administered 2013-08-02: 2 [drp] via OPHTHALMIC

## 2013-08-02 MED ORDER — TETRACAINE HCL 0.5 % OP SOLN
OPHTHALMIC | Status: AC
  Filled 2013-08-02: qty 2

## 2013-08-02 MED ORDER — CYCLOPENTOLATE-PHENYLEPHRINE OP SOLN OPTIME - NO CHARGE
OPHTHALMIC | Status: AC
  Filled 2013-08-02: qty 2

## 2013-08-02 MED ORDER — LIDOCAINE HCL 3.5 % OP GEL
1.0000 "application " | Freq: Once | OPHTHALMIC | Status: AC
  Administered 2013-08-02: 1 via OPHTHALMIC

## 2013-08-02 SURGICAL SUPPLY — 31 items
CAPSULAR TENSION RING-AMO (OPHTHALMIC RELATED) IMPLANT
CLOTH BEACON ORANGE TIMEOUT ST (SAFETY) ×2 IMPLANT
EYE SHIELD UNIVERSAL CLEAR (GAUZE/BANDAGES/DRESSINGS) ×2 IMPLANT
GLOVE BIO SURGEON STRL SZ7.5 (GLOVE) IMPLANT
GLOVE BIOGEL M 6.5 STRL (GLOVE) IMPLANT
GLOVE BIOGEL PI IND STRL 6.5 (GLOVE) IMPLANT
GLOVE BIOGEL PI IND STRL 7.0 (GLOVE) IMPLANT
GLOVE BIOGEL PI INDICATOR 6.5 (GLOVE) ×2
GLOVE BIOGEL PI INDICATOR 7.0 (GLOVE)
GLOVE ECLIPSE 6.5 STRL STRAW (GLOVE) IMPLANT
GLOVE ECLIPSE 7.5 STRL STRAW (GLOVE) IMPLANT
GLOVE EXAM NITRILE LRG STRL (GLOVE) IMPLANT
GLOVE EXAM NITRILE MD LF STRL (GLOVE) IMPLANT
GLOVE SKINSENSE NS SZ6.5 (GLOVE)
GLOVE SKINSENSE NS SZ7.0 (GLOVE)
GLOVE SKINSENSE STRL SZ6.5 (GLOVE) IMPLANT
GLOVE SKINSENSE STRL SZ7.0 (GLOVE) IMPLANT
GLOVE SS N UNI LF 7.0 STRL (GLOVE) ×2 IMPLANT
INST SET CATARACT ~~LOC~~ (KITS) ×3 IMPLANT
KIT VITRECTOMY (OPHTHALMIC RELATED) IMPLANT
PAD ARMBOARD 7.5X6 YLW CONV (MISCELLANEOUS) ×2 IMPLANT
PROC W NO LENS (INTRAOCULAR LENS)
PROC W SPEC LENS (INTRAOCULAR LENS)
PROCESS W NO LENS (INTRAOCULAR LENS) IMPLANT
PROCESS W SPEC LENS (INTRAOCULAR LENS) IMPLANT
RING MALYGIN (MISCELLANEOUS) IMPLANT
SIGHTPATH CAT PROC W REG LENS (Ophthalmic Related) ×3 IMPLANT
TAPE SURG TRANSPORE 1 IN (GAUZE/BANDAGES/DRESSINGS) IMPLANT
TAPE SURGICAL TRANSPORE 1 IN (GAUZE/BANDAGES/DRESSINGS) ×2
VISCOELASTIC ADDITIONAL (OPHTHALMIC RELATED) IMPLANT
WATER STERILE IRR 250ML POUR (IV SOLUTION) ×3 IMPLANT

## 2013-08-02 NOTE — Discharge Instructions (Signed)
[name] 08/02/2013 Dr. Iona Hansen Post operative Instructions for Cataract Patients  These instructions are for [name] and pertain to the operative eye.  1.  Resume your normal diet and previous oral medicines.  2. Your Follow-up appointment is at Dr. Iona Hansen' office in [place] on 08/03/13 at 3:15pm  3. You may leave the hospital when your driver is present and your nurse releases you.  4. Begin Pred Forte (prednisolone acetate 1%), Acular LS (ketorolac tromethamine .4%) and Gatifloxacin 0.5% eye drops; 1 drop each 4 times daily to operative eye. Begin 3 hours after discharge from Short Stay Unit.  Moxifloxacin 0.5% may be substituted for Gatifloxacin using the same instructions.  33. Page Dr. Iona Hansen via beeper [phone] for significant pain in or around operative eye that is not relieved by Tylenol.  6. If you took Plavix before surgery, restart it at the usual dose on the evening of surgery.  7. Wear dark glasses as necessary for excessive light sensitivity.  8. Do no forcefully rub you your operative eye.  9. Keep your operative eye dry for 1 week. You may gently clean your eyelids with a damp washcloth.  10. You may resume normal occupational activities in one week and resume driving as tolerated after the first post operative visit.  11. It is normal to have blurred vision and a scratchy sensation following surgery.  Dr. Iona Hansen: [phone]

## 2013-08-02 NOTE — Transfer of Care (Signed)
Immediate Anesthesia Transfer of Care Note  Patient: [name]  Procedure(s) Performed: Procedure(s) with comments: CATARACT EXTRACTION PHACO AND INTRAOCULAR LENS PLACEMENT (IOC) (Left) - CDE 2.57  Patient Location: Short Stay  Anesthesia Type:MAC  Level of Consciousness: awake, alert , oriented and patient cooperative  Airway & Oxygen Therapy: Patient Spontanous Breathing  Post-op Assessment: Report given to PACU RN, Post -op Vital signs reviewed and stable and Patient moving all extremities  Post vital signs: Reviewed and stable  Complications: No apparent anesthesia complications

## 2013-08-02 NOTE — Op Note (Signed)
See scanned note.

## 2013-08-02 NOTE — H&P (Signed)
I have reviewed the pre printed H&P, the patient was re-examined, and I have identified no significant interval changes in the patient's medical condition.  There is no change in the plan of care since the history and physical of record. 

## 2013-08-02 NOTE — Brief Op Note (Signed)
08/02/2013  8:19 AM  PATIENT:  [name]  68 y.o. female  PRE-OPERATIVE DIAGNOSIS:  nuclear cataract left eye  POST-OPERATIVE DIAGNOSIS:  nuclear cataract left eye  PROCEDURE:  Procedure(s): CATARACT EXTRACTION PHACO AND INTRAOCULAR LENS PLACEMENT ([place])  SURGEON:  Surgeon(s): Williams Che, MD  ASSISTANTS: staff   ANESTHESIA STAFF: Anesthesiologist: Lerry Liner, MD CRNA: Charmaine Downs, CRNA  ANESTHESIA:   topical and MAC  REQUESTED LENS POWER: 25.5  LENS IMPLANT INFORMATION: Alcon [id number] s/n [id number]  Exp 11/03/2017  CUMULATIVE DISSIPATED ENERGY:2.57  INDICATIONS:see office H&P  OP FINDINGS:mod dense NS and [place]  COMPLICATIONS:None  DICTATION #: scanned note  PLAN OF CARE: as above  PATIENT DISPOSITION:  Short Stay

## 2013-08-02 NOTE — Anesthesia Postprocedure Evaluation (Signed)
  Anesthesia Post-op Note  Patient: [name]  Procedure(s) Performed: Procedure(s) with comments: CATARACT EXTRACTION PHACO AND INTRAOCULAR LENS PLACEMENT (IOC) (Left) - CDE 2.57  Patient Location: Short Stay  Anesthesia Type:MAC  Level of Consciousness: awake, alert , oriented and patient cooperative  Airway and Oxygen Therapy: Patient Spontanous Breathing and Patient connected to nasal cannula oxygen  Post-op Pain: none  Post-op Assessment: Post-op Vital signs reviewed, Patient's Cardiovascular Status Stable, Respiratory Function Stable, Patent Airway and Pain level controlled  Post-op Vital Signs: Reviewed and stable  Last Vitals:  Filed Vitals:   08/02/13 0725  BP: 130/77  Pulse: 98  Temp:   Resp: 24    Complications: No apparent anesthesia complications

## 2013-08-02 NOTE — Anesthesia Preprocedure Evaluation (Signed)
Anesthesia Evaluation  Patient identified by MRN, date of birth, ID band Patient awake    Reviewed: Allergy & Precautions, H&P , NPO status , Patient's Chart, lab work & pertinent test results  Airway Mallampati: III TM Distance: >3 FB Neck ROM: Full  Mouth opening: Limited Mouth Opening  Dental  (+) Partial Upper, Teeth Intact   Pulmonary COPDformer smoker,  breath sounds clear to auscultation        Cardiovascular hypertension, Pt. on medications +CHF DOE: hx tachycardia. + dysrhythmias Rhythm:Regular Rate:Normal     Neuro/Psych PSYCHIATRIC DISORDERS Anxiety    GI/Hepatic hiatal hernia, PUD, GERD-  Controlled and Medicated,  Endo/Other  diabetes, Type 2, Oral Hypoglycemic Agents  Renal/GU      Musculoskeletal   Abdominal   Peds  Hematology   Anesthesia Other Findings   Reproductive/Obstetrics                           Anesthesia Physical Anesthesia Plan  ASA: III  Anesthesia Plan: MAC   Post-op Pain Management:    Induction: Intravenous  Airway Management Planned: Nasal Cannula  Additional Equipment:   Intra-op Plan:   Post-operative Plan:   Informed Consent: I have reviewed the patients History and Physical, chart, labs and discussed the procedure including the risks, benefits and alternatives for the proposed anesthesia with the patient or authorized representative who has indicated his/her understanding and acceptance.     Plan Discussed with:   Anesthesia Plan Comments:         Anesthesia Quick Evaluation

## 2013-12-13 NOTE — Patient Instructions (Signed)
Your procedure is scheduled on: 12/20/2013  Report to [facility] at  77  AM.  Call this number if you have problems the morning of surgery: [phone]   Do not eat food or drink liquids :After Midnight.      Take these medicines the morning of surgery with A SIP OF WATER: prozac, zyrtec, valium, lisinopril,prilosec, theophylline. Take your inhaler and nebilzer before you come. Do NOT take any of your medicine for diabetes.   Do not wear jewelry, make-up or nail polish.  Do not wear lotions, powders, or perfumes.   Do not shave 48 hours prior to surgery.  Do not bring valuables to the hospital.  Contacts, dentures or bridgework may not be worn into surgery.  Leave suitcase in the car. After surgery it may be brought to your room.  For patients admitted to the hospital, checkout time is 11:00 AM the day of discharge.   Patients discharged the day of surgery will not be allowed to drive home.  :     Please read over the following fact sheets that you were given: Coughing and Deep Breathing, Surgical Site Infection Prevention, Anesthesia Post-op Instructions and Care and Recovery After Surgery    Cataract A cataract is a clouding of the lens of the eye. When a lens becomes cloudy, vision is reduced based on the degree and nature of the clouding. Many cataracts reduce vision to some degree. Some cataracts make people more near-sighted as they develop. Other cataracts increase glare. Cataracts that are ignored and become worse can sometimes look white. The white color can be seen through the pupil. CAUSES   Aging. However, cataracts may occur at any age, even in newborns.   Certain drugs.   Trauma to the eye.   Certain diseases such as diabetes.   Specific eye diseases such as chronic inflammation inside the eye or a sudden attack of a rare form of glaucoma.   Inherited or acquired medical problems.  SYMPTOMS   Gradual, progressive drop in vision in the affected eye.   Severe,  rapid visual loss. This most often happens when trauma is the cause.  DIAGNOSIS  To detect a cataract, an eye doctor examines the lens. Cataracts are best diagnosed with an exam of the eyes with the pupils enlarged (dilated) by drops.  TREATMENT  For an early cataract, vision may improve by using different eyeglasses or stronger lighting. If that does not help your vision, surgery is the only effective treatment. A cataract needs to be surgically removed when vision loss interferes with your everyday activities, such as driving, reading, or watching TV. A cataract may also have to be removed if it prevents examination or treatment of another eye problem. Surgery removes the cloudy lens and usually replaces it with a substitute lens (intraocular lens, IOL).  At a time when both you and your doctor agree, the cataract will be surgically removed. If you have cataracts in both eyes, only one is usually removed at a time. This allows the operated eye to heal and be out of danger from any possible problems after surgery (such as infection or poor wound healing). In rare cases, a cataract may be doing damage to your eye. In these cases, your caregiver may advise surgical removal right away. The vast majority of people who have cataract surgery have better vision afterward. HOME CARE INSTRUCTIONS  If you are not planning surgery, you may be asked to do the following:  Use different eyeglasses.  Use stronger or brighter lighting.   Ask your eye doctor about reducing your medicine dose or changing medicines if it is thought that a medicine caused your cataract. Changing medicines does not make the cataract go away on its own.   Become familiar with your surroundings. Poor vision can lead to injury. Avoid bumping into things on the affected side. You are at a higher risk for tripping or falling.   Exercise extreme care when driving or operating machinery.   Wear sunglasses if you are sensitive to bright  light or experiencing problems with glare.  SEEK IMMEDIATE MEDICAL CARE IF:   You have a worsening or sudden vision loss.   You notice redness, swelling, or increasing pain in the eye.   You have a fever.  Document Released: 01/21/2005 Document Revised: 01/10/2011 Document Reviewed: 09/14/2010 [facility] Patient Information 2012 [place].PATIENT INSTRUCTIONS POST-ANESTHESIA  IMMEDIATELY FOLLOWING SURGERY:  Do not drive or operate machinery for the first twenty four hours after surgery.  Do not make any important decisions for twenty four hours after surgery or while taking narcotic pain medications or sedatives.  If you develop intractable nausea and vomiting or a severe headache please notify your doctor immediately.  FOLLOW-UP:  Please make an appointment with your surgeon as instructed. You do not need to follow up with anesthesia unless specifically instructed to do so.  WOUND CARE INSTRUCTIONS (if applicable):  Keep a dry clean dressing on the anesthesia/puncture wound site if there is drainage.  Once the wound has quit draining you may leave it open to air.  Generally you should leave the bandage intact for twenty four hours unless there is drainage.  If the epidural site drains for more than 36-48 hours please call the anesthesia department.  QUESTIONS?:  Please feel free to call your physician or the hospital operator if you have any questions, and they will be happy to assist you.

## 2013-12-14 ENCOUNTER — Encounter (HOSPITAL_COMMUNITY): Payer: Self-pay

## 2013-12-14 ENCOUNTER — Encounter (HOSPITAL_COMMUNITY)
Admission: RE | Admit: 2013-12-14 | Discharge: 2013-12-14 | Disposition: A | Discharge status: Home / Self Care | Payer: Medicare Other | Source: Ambulatory Visit | Attending: Ophthalmology | Admitting: Ophthalmology

## 2013-12-14 DIAGNOSIS — Z22321 Carrier or suspected carrier of Methicillin susceptible Staphylococcus aureus: Secondary | ICD-10-CM | POA: Diagnosis not present

## 2013-12-14 DIAGNOSIS — Z01812 Encounter for preprocedural laboratory examination: Secondary | ICD-10-CM | POA: Insufficient documentation

## 2013-12-14 LAB — BASIC METABOLIC PANEL
Anion gap: 18 — ABNORMAL HIGH (ref 5–15)
BUN: 14 mg/dL (ref 6–23)
CALCIUM: 10 mg/dL (ref 8.4–10.5)
CO2: 28 mEq/L (ref 19–32)
Chloride: 94 mEq/L — ABNORMAL LOW (ref 96–112)
Creatinine, Ser: 0.92 mg/dL (ref 0.50–1.10)
GFR calc Af Amer: 72 mL/min — ABNORMAL LOW (ref >90)
GFR, EST NON AFRICAN AMERICAN: 63 mL/min — AB (ref >90)
GLUCOSE: 214 mg/dL — AB (ref 70–99)
Potassium: 4.4 mEq/L (ref 3.7–5.3)
Sodium: 140 mEq/L (ref 137–147)

## 2013-12-14 LAB — SURGICAL PCR SCREEN
MRSA, PCR: NEGATIVE
Staphylococcus aureus: POSITIVE — AB

## 2013-12-14 LAB — HEMOGLOBIN AND HEMATOCRIT, BLOOD
HCT: 40.9 % (ref 36.0–46.0)
Hemoglobin: 13.8 g/dL (ref 12.0–15.0)

## 2013-12-14 NOTE — Progress Notes (Signed)
[place] Dr. Iona Hansen about [name] positive for SA. Order given for Mupirocin BID for five days. Patient to pick up Mupirocin at [facility] on Wednesday, November 11 to begin treatment. No further orders at this time. Called [name] at home. Her husband will pick up Mupirocin in the morning. See Vonda Antigua. Verbalized understanding of instructions. Instructions to be sent with prescription.

## 2013-12-15 MED ORDER — MUPIROCIN 2 % EX OINT: TOPICAL_OINTMENT | Freq: Two times a day (BID) | CUTANEOUS | Status: DC

## 2013-12-17 MED ORDER — LIDOCAINE HCL 3.5 % OP GEL
OPHTHALMIC | Status: AC
  Filled 2013-12-17: qty 1

## 2013-12-17 MED ORDER — TETRACAINE HCL 0.5 % OP SOLN
OPHTHALMIC | Status: AC
  Filled 2013-12-17: qty 2

## 2013-12-17 MED ORDER — CYCLOPENTOLATE-PHENYLEPHRINE OP SOLN OPTIME - NO CHARGE
OPHTHALMIC | Status: AC
Start: 2013-12-17 — End: 2013-12-17
  Filled 2013-12-17: qty 2

## 2013-12-20 ENCOUNTER — Ambulatory Visit (HOSPITAL_COMMUNITY): Payer: Medicare Other | Admitting: Anesthesiology

## 2013-12-20 ENCOUNTER — Encounter (HOSPITAL_COMMUNITY)
Admission: RE | Disposition: A | Discharge status: Home / Self Care | Payer: Self-pay | Source: Ambulatory Visit | Attending: Ophthalmology

## 2013-12-20 ENCOUNTER — Encounter (HOSPITAL_COMMUNITY): Payer: Self-pay | Admitting: *Deleted

## 2013-12-20 ENCOUNTER — Ambulatory Visit (HOSPITAL_COMMUNITY)
Admission: RE | Admit: 2013-12-20 | Discharge: 2013-12-20 | Disposition: A | Discharge status: Home / Self Care | Payer: Medicare Other | Source: Ambulatory Visit | Attending: Ophthalmology | Admitting: Ophthalmology

## 2013-12-20 DIAGNOSIS — H2511 Age-related nuclear cataract, right eye: Secondary | ICD-10-CM | POA: Diagnosis present

## 2013-12-20 HISTORY — PX: CATARACT EXTRACTION W/PHACO: SHX586

## 2013-12-20 LAB — GLUCOSE, CAPILLARY: Glucose-Capillary: 187 mg/dL — ABNORMAL HIGH (ref 70–99)

## 2013-12-20 SURGERY — PHACOEMULSIFICATION, CATARACT, WITH IOL INSERTION
Anesthesia: Monitor Anesthesia Care | Site: Eye | Laterality: Right

## 2013-12-20 MED ORDER — MIDAZOLAM HCL 5 MG/5ML IJ SOLN
INTRAMUSCULAR | Status: DC | PRN
  Administered 2013-12-20 (×4): 0.5 mg via INTRAVENOUS

## 2013-12-20 MED ORDER — TETRACAINE HCL 0.5 % OP SOLN
1.0000 [drp] | OPHTHALMIC | Status: AC
  Administered 2013-12-20 (×3): 1 [drp] via OPHTHALMIC

## 2013-12-20 MED ORDER — EPINEPHRINE HCL 1 MG/ML IJ SOLN
INTRAMUSCULAR | Status: AC
  Filled 2013-12-20: qty 1

## 2013-12-20 MED ORDER — LACTATED RINGERS IV SOLN
INTRAVENOUS | Status: DC
  Administered 2013-12-20: 08:00:00 via INTRAVENOUS

## 2013-12-20 MED ORDER — LIDOCAINE HCL 3.5 % OP GEL
OPHTHALMIC | Status: DC | PRN
  Administered 2013-12-20: 1 via OPHTHALMIC

## 2013-12-20 MED ORDER — POVIDONE-IODINE 5 % OP SOLN
OPHTHALMIC | Status: DC | PRN
  Administered 2013-12-20: 1 via OPHTHALMIC

## 2013-12-20 MED ORDER — FENTANYL CITRATE 0.05 MG/ML IJ SOLN
25.0000 ug | INTRAMUSCULAR | Status: AC
  Administered 2013-12-20 (×2): 25 ug via INTRAVENOUS

## 2013-12-20 MED ORDER — LIDOCAINE HCL 3.5 % OP GEL: 1.0000 "application " | Freq: Once | OPHTHALMIC | Status: DC

## 2013-12-20 MED ORDER — TETRACAINE 0.5 % OP SOLN OPTIME - NO CHARGE
OPHTHALMIC | Status: DC | PRN
  Administered 2013-12-20: 1 [drp] via OPHTHALMIC

## 2013-12-20 MED ORDER — MIDAZOLAM HCL 2 MG/2ML IJ SOLN
INTRAMUSCULAR | Status: AC
  Filled 2013-12-20: qty 2

## 2013-12-20 MED ORDER — CYCLOPENTOLATE-PHENYLEPHRINE 0.2-1 % OP SOLN
1.0000 [drp] | OPHTHALMIC | Status: AC
  Administered 2013-12-20 (×3): 1 [drp] via OPHTHALMIC

## 2013-12-20 MED ORDER — LACTATED RINGERS IV SOLN
INTRAVENOUS | Status: DC | PRN
  Administered 2013-12-20: 08:00:00 via INTRAVENOUS

## 2013-12-20 MED ORDER — NA HYALUR & NA CHOND-NA HYALUR 0.55-0.5 ML IO KIT
PACK | INTRAOCULAR | Status: DC | PRN
  Administered 2013-12-20: 1 via OPHTHALMIC

## 2013-12-20 MED ORDER — EPINEPHRINE HCL 1 MG/ML IJ SOLN
INTRAOCULAR | Status: DC | PRN
  Administered 2013-12-20: 09:00:00

## 2013-12-20 MED ORDER — BSS IO SOLN
INTRAOCULAR | Status: DC | PRN
Start: 2013-12-20 — End: 2013-12-20
  Administered 2013-12-20: 15 mL via INTRAOCULAR

## 2013-12-20 MED ORDER — MIDAZOLAM HCL 2 MG/2ML IJ SOLN
1.0000 mg | INTRAMUSCULAR | Status: DC | PRN
  Administered 2013-12-20: 2 mg via INTRAVENOUS

## 2013-12-20 MED ORDER — FENTANYL CITRATE 0.05 MG/ML IJ SOLN
INTRAMUSCULAR | Status: AC
  Filled 2013-12-20: qty 2

## 2013-12-20 SURGICAL SUPPLY — 28 items
CAPSULAR TENSION RING-AMO (OPHTHALMIC RELATED) IMPLANT
CLOTH BEACON ORANGE TIMEOUT ST (SAFETY) ×3 IMPLANT
GLOVE BIO SURGEON STRL SZ7.5 (GLOVE) IMPLANT
GLOVE BIOGEL M 6.5 STRL (GLOVE) IMPLANT
GLOVE BIOGEL PI IND STRL 6.5 (GLOVE) IMPLANT
GLOVE BIOGEL PI IND STRL 7.0 (GLOVE) IMPLANT
GLOVE BIOGEL PI INDICATOR 6.5 (GLOVE) ×2
GLOVE BIOGEL PI INDICATOR 7.0 (GLOVE)
GLOVE ECLIPSE 6.5 STRL STRAW (GLOVE) IMPLANT
GLOVE ECLIPSE 7.5 STRL STRAW (GLOVE) IMPLANT
GLOVE EXAM NITRILE LRG STRL (GLOVE) IMPLANT
GLOVE EXAM NITRILE MD LF STRL (GLOVE) ×2 IMPLANT
GLOVE SKINSENSE NS SZ6.5 (GLOVE)
GLOVE SKINSENSE NS SZ7.0 (GLOVE)
GLOVE SKINSENSE STRL SZ6.5 (GLOVE) IMPLANT
GLOVE SKINSENSE STRL SZ7.0 (GLOVE) IMPLANT
INST SET CATARACT ~~LOC~~ (KITS) ×3 IMPLANT
KIT VITRECTOMY (OPHTHALMIC RELATED) IMPLANT
PAD ARMBOARD 7.5X6 YLW CONV (MISCELLANEOUS) ×2 IMPLANT
PROC W NO LENS (INTRAOCULAR LENS)
PROC W SPEC LENS (INTRAOCULAR LENS)
PROCESS W NO LENS (INTRAOCULAR LENS) IMPLANT
PROCESS W SPEC LENS (INTRAOCULAR LENS) IMPLANT
RETRACTOR IRIS SIGHTPATH (OPHTHALMIC RELATED) IMPLANT
RING MALYGIN (MISCELLANEOUS) IMPLANT
SIGHTPATH CAT PROC W REG LENS (Ophthalmic Related) ×3 IMPLANT
VISCOELASTIC ADDITIONAL (OPHTHALMIC RELATED) IMPLANT
WATER STERILE IRR 250ML POUR (IV SOLUTION) ×2 IMPLANT

## 2013-12-20 NOTE — Anesthesia Procedure Notes (Signed)
Procedure Name: MAC Date/Time: 12/20/2013 8:30 AM Performed by: Andree Elk, AMY A Pre-anesthesia Checklist: Patient identified, Timeout performed, Emergency Drugs available, Suction available and Patient being monitored Oxygen Delivery Method: Nasal cannula

## 2013-12-20 NOTE — Brief Op Note (Signed)
12/20/2013  9:38 AM  PATIENT:  [name]  68 y.o. female  PRE-OPERATIVE DIAGNOSIS:  nuclear cataract right eye  POST-OPERATIVE DIAGNOSIS:  nuclear cataract right eye  PROCEDURE:  Procedure(s): CATARACT EXTRACTION PHACO AND INTRAOCULAR LENS PLACEMENT; CDE: 2.56  SURGEON:  Surgeon(s): Williams Che, MD  ASSISTANTS:  Cleda Clarks, CST  ANESTHESIA STAFF: Anesthesiologist: Lerry Liner, MD CRNA: Mickel Baas, CRNA  ANESTHESIA:   topical and MAC  REQUESTED LENS POWER: 24.5  LENS IMPLANT INFORMATION:  Alcon SN60WF  S/n [id number]  Exp 04/2018  CUMULATIVE DISSIPATED ENERGY:2.56  INDICATIONS:see scanned office H&P  OP FINDINGS:mod. dense NS  COMPLICATIONS:None  DICTATION #: see scanned op note  PLAN OF CARE: as above  PATIENT DISPOSITION:  Short Stay

## 2013-12-20 NOTE — Op Note (Signed)
See scanned op note 

## 2013-12-20 NOTE — Anesthesia Preprocedure Evaluation (Signed)
Anesthesia Evaluation  Patient identified by MRN, date of birth, ID band Patient awake    Reviewed: Allergy & Precautions, H&P , NPO status , Patient's Chart, lab work & pertinent test results  Airway Mallampati: III  TM Distance: >3 FB Neck ROM: Full  Mouth opening: Limited Mouth Opening  Dental  (+) Partial Upper, Teeth Intact   Pulmonary COPDformer smoker,  breath sounds clear to auscultation        Cardiovascular hypertension, Pt. on medications +CHF DOE: hx tachycardia. + dysrhythmias Rhythm:Regular Rate:Normal     Neuro/Psych PSYCHIATRIC DISORDERS Anxiety    GI/Hepatic hiatal hernia, PUD, GERD-  Controlled and Medicated,  Endo/Other  diabetes, Type 2, Oral Hypoglycemic Agents  Renal/GU      Musculoskeletal   Abdominal   Peds  Hematology   Anesthesia Other Findings   Reproductive/Obstetrics                             Anesthesia Physical Anesthesia Plan  ASA: III  Anesthesia Plan: MAC   Post-op Pain Management:    Induction: Intravenous  Airway Management Planned: Nasal Cannula  Additional Equipment:   Intra-op Plan:   Post-operative Plan:   Informed Consent: I have reviewed the patients History and Physical, chart, labs and discussed the procedure including the risks, benefits and alternatives for the proposed anesthesia with the patient or authorized representative who has indicated his/her understanding and acceptance.     Plan Discussed with:   Anesthesia Plan Comments:         Anesthesia Quick Evaluation

## 2013-12-20 NOTE — Transfer of Care (Signed)
Immediate Anesthesia Transfer of Care Note  Patient: [name]  Procedure(s) Performed: Procedure(s): CATARACT EXTRACTION PHACO AND INTRAOCULAR LENS PLACEMENT; CDE: 2.56 (Right)  Patient Location: Short Stay  Anesthesia Type:MAC  Level of Consciousness: awake, alert , oriented and patient cooperative  Airway & Oxygen Therapy: Patient Spontanous Breathing and Patient connected to nasal cannula oxygen  Post-op Assessment: Report given to PACU RN and Post -op Vital signs reviewed and stable  Post vital signs: Reviewed and stable  Complications: No apparent anesthesia complications

## 2013-12-20 NOTE — H&P (Signed)
I have reviewed the pre printed H&P, the patient was re-examined, and I have identified no significant interval changes in the patient's medical condition.  There is no change in the plan of care since the history and physical of record. 

## 2013-12-20 NOTE — Discharge Instructions (Signed)
[name] 12/20/2013 Dr. Iona Hansen Post operative Instructions for Cataract Patients  These instructions are for [name] and pertain to the operative eye.  1.  Resume your normal diet and previous oral medicines.  2. Your Follow-up appointment is at Dr. Iona Hansen' office in [place] on 12/21/13 @ 9:45 am 3. You may leave the hospital when your driver is present and your nurse releases you.  4. Begin Pred Forte (prednisolone acetate 1%), Acular LS (ketorolac tromethamine .4%) and Gatifloxacin 0.5% eye drops; 1 drop each 4 times daily to operative eye. Begin 3 hours after discharge from Short Stay Unit.  Moxifloxacin 0.5% may be substituted for Gatifloxacin using the same instructions.  23. Page Dr. Iona Hansen via beeper [phone] for significant pain in or around operative eye that is not relieved by Tylenol.  6. If you took Plavix before surgery, restart it at the usual dose on the evening of surgery.  7. Wear dark glasses as necessary for excessive light sensitivity.  8. Do no forcefully rub you your operative eye.  9. Keep your operative eye dry for 1 week. You may gently clean your eyelids with a damp washcloth.  10. You may resume normal occupational activities in one week and resume driving as tolerated after the first post operative visit.  11. It is normal to have blurred vision and a scratchy sensation following surgery.  Dr. Iona Hansen: [phone]  PATIENT INSTRUCTIONS POST-ANESTHESIA  IMMEDIATELY FOLLOWING SURGERY:  Do not drive or operate machinery for the first twenty four hours after surgery.  Do not make any important decisions for twenty four hours after surgery or while taking narcotic pain medications or sedatives.  If you develop intractable nausea and vomiting or a severe headache please notify your doctor immediately.  FOLLOW-UP:  Please make an appointment with your surgeon as instructed. You do not need to follow up with anesthesia unless specifically instructed to do  so.  WOUND CARE INSTRUCTIONS (if applicable):  Keep a dry clean dressing on the anesthesia/puncture wound site if there is drainage.  Once the wound has quit draining you may leave it open to air.  Generally you should leave the bandage intact for twenty four hours unless there is drainage.  If the epidural site drains for more than 36-48 hours please call the anesthesia department.  QUESTIONS?:  Please feel free to call your physician or the hospital operator if you have any questions, and they will be happy to assist you.

## 2013-12-20 NOTE — Anesthesia Postprocedure Evaluation (Signed)
  Anesthesia Post-op Note  Patient: [name]  Procedure(s) Performed: Procedure(s): CATARACT EXTRACTION PHACO AND INTRAOCULAR LENS PLACEMENT; CDE: 2.56 (Right)  Patient Location: Short Stay  Anesthesia Type:MAC  Level of Consciousness: awake, alert , oriented and patient cooperative  Airway and Oxygen Therapy: Patient Spontanous Breathing  Post-op Pain: none  Post-op Assessment: Post-op Vital signs reviewed, Patient's Cardiovascular Status Stable, Respiratory Function Stable, Patent Airway, No signs of Nausea or vomiting and No headache  Post-op Vital Signs: Reviewed and stable  Last Vitals:  Filed Vitals:   12/20/13 0820  BP: 119/75  Pulse:   Temp:   Resp: 60    Complications: No apparent anesthesia complications

## 2013-12-21 ENCOUNTER — Encounter (HOSPITAL_COMMUNITY): Payer: Self-pay | Admitting: Ophthalmology

## 2014-10-10 ENCOUNTER — Encounter (HOSPITAL_COMMUNITY): Payer: Self-pay | Admitting: Emergency Medicine

## 2014-10-10 ENCOUNTER — Emergency Department (HOSPITAL_COMMUNITY): Payer: Medicare Other

## 2014-10-10 DIAGNOSIS — S4991XA Unspecified injury of right shoulder and upper arm, initial encounter: Secondary | ICD-10-CM | POA: Diagnosis present

## 2014-10-10 DIAGNOSIS — Z87891 Personal history of nicotine dependence: Secondary | ICD-10-CM | POA: Diagnosis not present

## 2014-10-10 DIAGNOSIS — Z8701 Personal history of pneumonia (recurrent): Secondary | ICD-10-CM | POA: Diagnosis not present

## 2014-10-10 DIAGNOSIS — W010XXA Fall on same level from slipping, tripping and stumbling without subsequent striking against object, initial encounter: Secondary | ICD-10-CM | POA: Diagnosis not present

## 2014-10-10 DIAGNOSIS — Z86018 Personal history of other benign neoplasm: Secondary | ICD-10-CM | POA: Diagnosis not present

## 2014-10-10 DIAGNOSIS — S42211A Unspecified displaced fracture of surgical neck of right humerus, initial encounter for closed fracture: Secondary | ICD-10-CM | POA: Insufficient documentation

## 2014-10-10 DIAGNOSIS — I10 Essential (primary) hypertension: Secondary | ICD-10-CM | POA: Diagnosis not present

## 2014-10-10 DIAGNOSIS — Y998 Other external cause status: Secondary | ICD-10-CM | POA: Insufficient documentation

## 2014-10-10 DIAGNOSIS — K219 Gastro-esophageal reflux disease without esophagitis: Secondary | ICD-10-CM | POA: Insufficient documentation

## 2014-10-10 DIAGNOSIS — Y9289 Other specified places as the place of occurrence of the external cause: Secondary | ICD-10-CM | POA: Diagnosis not present

## 2014-10-10 DIAGNOSIS — J449 Chronic obstructive pulmonary disease, unspecified: Secondary | ICD-10-CM | POA: Insufficient documentation

## 2014-10-10 DIAGNOSIS — Z79899 Other long term (current) drug therapy: Secondary | ICD-10-CM | POA: Insufficient documentation

## 2014-10-10 DIAGNOSIS — Z86010 Personal history of colonic polyps: Secondary | ICD-10-CM | POA: Diagnosis not present

## 2014-10-10 DIAGNOSIS — S80211A Abrasion, right knee, initial encounter: Secondary | ICD-10-CM | POA: Diagnosis not present

## 2014-10-10 DIAGNOSIS — Z7951 Long term (current) use of inhaled steroids: Secondary | ICD-10-CM | POA: Diagnosis not present

## 2014-10-10 DIAGNOSIS — E119 Type 2 diabetes mellitus without complications: Secondary | ICD-10-CM | POA: Diagnosis not present

## 2014-10-10 DIAGNOSIS — Z794 Long term (current) use of insulin: Secondary | ICD-10-CM | POA: Insufficient documentation

## 2014-10-10 DIAGNOSIS — Y9301 Activity, walking, marching and hiking: Secondary | ICD-10-CM | POA: Insufficient documentation

## 2014-10-10 NOTE — ED Notes (Signed)
Pt states she tripped over her dog and injured her rt arm. Pt c/o pain from shoulder to hand.

## 2014-10-11 ENCOUNTER — Emergency Department (HOSPITAL_COMMUNITY)
Admission: EM | Admit: 2014-10-11 | Discharge: 2014-10-11 | Disposition: A | Discharge status: Home / Self Care | Payer: Medicare Other | Attending: Emergency Medicine | Admitting: Emergency Medicine

## 2014-10-11 DIAGNOSIS — Y92009 Unspecified place in unspecified non-institutional (private) residence as the place of occurrence of the external cause: Secondary | ICD-10-CM

## 2014-10-11 DIAGNOSIS — S42211A Unspecified displaced fracture of surgical neck of right humerus, initial encounter for closed fracture: Secondary | ICD-10-CM

## 2014-10-11 DIAGNOSIS — W19XXXA Unspecified fall, initial encounter: Secondary | ICD-10-CM

## 2014-10-11 MED ORDER — ONDANSETRON HCL 4 MG/2ML IJ SOLN
4.0000 mg | Freq: Once | INTRAMUSCULAR | Status: AC
  Administered 2014-10-11: 4 mg via INTRAVENOUS
  Filled 2014-10-11: qty 2

## 2014-10-11 MED ORDER — FENTANYL CITRATE (PF) 100 MCG/2ML IJ SOLN
50.0000 ug | Freq: Once | INTRAMUSCULAR | Status: AC
  Administered 2014-10-11: 50 ug via NASAL
  Filled 2014-10-11: qty 2

## 2014-10-11 MED ORDER — OXYCODONE HCL 5 MG PO TABS: 5.0000 mg | ORAL_TABLET | Freq: Four times a day (QID) | ORAL | Status: DC | PRN

## 2014-10-11 MED ORDER — OXYCODONE-ACETAMINOPHEN 5-325 MG PO TABS
1.0000 | ORAL_TABLET | Freq: Once | ORAL | Status: AC
  Administered 2014-10-11: 1 via ORAL
  Filled 2014-10-11: qty 1

## 2014-10-11 NOTE — ED Provider Notes (Signed)
CSN: [id number]     Arrival date & time 10/10/14  2326 History   First MD Initiated Contact with Patient 10/11/14 0245     Chief Complaint  Patient presents with  . Fall     (Consider location/radiation/quality/duration/timing/severity/associated sxs/prior Treatment) HPI  Patient states about 8 PM tonight she was walking and she tripped over her large doll. She states she fell on the floor and hit her right shoulder. She states she heard a crack when she landed. She did not hit her head or have loss of consciousness. She has pain in her right shoulder when she tries to move her arm. She denies numbness in her hand. She also complains of a scrape on her right knee but her knee does not hurt. She did take an extra pain pill without relief. Patient is right-handed.  PCP Dr. Woody Seller in [place] none  Past Medical History  Diagnosis Date  . GERD (gastroesophageal reflux disease)   . Allergic rhinitis   . Pneumonia   . Hypertension   . Diabetes mellitus, type 2   . 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 Duodenal ulcer   . Hiatal hernia   . Diverticulosis   . IBS (irritable bowel syndrome)   . Diverticulitis 2001  . Anxiety   . Chronic airway obstruction, not elsewhere classified   . 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 Tubulovillous adenoma of colon 1998   Past Surgical History  Procedure Laterality Date  . Cesarean section  1971  . Abdominal hysterectomy  1980  . Colectomy  07/1999    Sigmoid-chronic diverticulitis  . Rod in left leg    . Cholecystectomy      ERCP sphincterotomy, stone extraction 2006  . Endoscopic retrograde cholangiopancreatography (ercp) with propofol  11/06/2004    RMR: Normal appearing ampulla/Mildly diffusely dilated biliary tree/ status post sphincterotomy and stone extraction as  . Esophagogastroduodenoscopy  09/1999    [place]: Gastritis, mild duodenitis  . Colonoscopy  05/1999    [place]: Multiple colonic polys (benign polypoid mucosa), very tortuous sigmoid colon  . Colonoscopy  01/13/2012    Procedure: COLONOSCOPY;  Surgeon: Daneil Dolin, MD;  Location: [facility];  Service: Endoscopy;  Laterality: N/A;  10:30  . Cataract extraction w/phaco Left 08/02/2013    Procedure: CATARACT EXTRACTION PHACO AND INTRAOCULAR LENS PLACEMENT (IOC);  Surgeon: Williams Che, MD;  Location: [facility];  Service: Ophthalmology;  Laterality: Left;  CDE 2.57  . Cataract extraction w/phaco Right 12/20/2013    Procedure: CATARACT EXTRACTION PHACO AND INTRAOCULAR LENS PLACEMENT; CDE: 2.56;  Surgeon: Williams Che, MD;  Location: [facility];  Service: Ophthalmology;  Laterality: Right;   Family History  Problem Relation Age of Onset  . Aneurysm Mother   . Lung cancer Father   . Diabetes Sister   . Diabetes Sister    Social History  Substance Use Topics  . Smoking status: Former Smoker -- 0.50 packs/day    Types: Cigarettes    Quit date: 07/01/2010  . Smokeless tobacco: None     Comment: Quit x 1.5 years  . Alcohol Use: No   Uses oxygen 3 L/m nasal cannula, 24/7   OB History    No data available     Review of Systems  All other systems reviewed and are negative.     Allergies  Aspirin and Nsaids  Home Medications  Prior to Admission medications   Medication Sig Start Date End Date Taking? Authorizing Provider  albuterol (PROVENTIL HFA;VENTOLIN HFA) 108 (90 BASE) MCG/ACT inhaler Inhale 2 puffs into the lungs every 6 (six) hours as needed. Shortness of Breath   Yes Historical Provider, MD  albuterol (PROVENTIL) (2.5 MG/3ML) 0.083% nebulizer solution Take 2.5 mg by nebulization daily as needed for wheezing or shortness of breath.   Yes Historical Provider, MD  atorvastatin (LIPITOR) 10 MG tablet Take 10 mg by mouth daily.   Yes Historical Provider, MD  ATROVENT HFA 17 MCG/ACT inhaler Take 3 mLs by nebulization 4 (four) times daily as needed. Shortness of Breath 12/20/11   Yes Historical Provider, MD  cetirizine (ZYRTEC) 10 MG tablet Take 10 mg by mouth at bedtime.    Yes Historical Provider, MD  diazepam (VALIUM) 5 MG tablet Take 5 mg by mouth 2 (two) times daily.   Yes Historical Provider, MD  fentaNYL (DURAGESIC - DOSED MCG/HR) 25 MCG/HR patch Place 25 mcg onto the skin every 3 (three) days.   Yes Historical Provider, MD  FLUoxetine (PROZAC) 20 MG capsule Take 60 mg by mouth daily.    Yes Historical Provider, MD  Fluticasone Furoate-Vilanterol (BREO ELLIPTA) 100-25 MCG/INH AEPB Inhale 1 puff into the lungs daily.   Yes Historical Provider, MD  glipiZIDE (GLUCOTROL) 10 MG tablet Take 10 mg by mouth 2 (two) times daily before a meal.   Yes Historical Provider, MD  hydrochlorothiazide 25 MG tablet Take 12.5 mg by mouth at bedtime.    Yes Historical Provider, MD  insulin aspart (NOVOLOG) 100 UNIT/ML injection Inject into the skin 3 (three) times daily before meals.   Yes Historical Provider, MD  insulin glargine (LANTUS) 100 UNIT/ML injection Inject into the skin at bedtime.   Yes Historical Provider, MD  lisinopril (PRINIVIL,ZESTRIL) 20 MG tablet Take 20 mg by mouth at bedtime.     Yes Historical Provider, MD  OLANZapine (ZYPREXA) 10 MG tablet Take 10 mg by mouth at bedtime.     Yes Historical Provider, MD  omeprazole (PRILOSEC) 20 MG capsule Take 20 mg by mouth daily.     Yes Historical Provider, MD  oxyCODONE-acetaminophen (PERCOCET/ROXICET) 5-325 MG per tablet Take 1 tablet by mouth 3 (three) times daily. Pain   Yes Historical Provider, MD  tiotropium (SPIRIVA) 18 MCG inhalation capsule Place 18 mcg into inhaler and inhale daily.     Yes Historical Provider, MD  oxyCODONE (ROXICODONE) 5 MG immediate release tablet Take 1 tablet (5 mg total) by mouth every 6 (six) hours as needed for moderate pain or severe pain. 10/11/14   Rolland Porter, MD  theophylline (THEO-24) 200 MG 24 hr capsule Take 200 mg by mouth 2 (two) times daily.    Historical Provider, MD   patient did not  list her fentanyl patch in her medication list, I added it.   BP 115/58 mmHg  Pulse 111  Temp(Src) 97.8 F (36.6 C)  Resp 19  Ht 5' (1.524 m)  Wt 170 lb (77.111 kg)  BMI 33.20 kg/m2  SpO2 92%  Vital signs normal except for tachycardia    Physical Exam  Constitutional: She is oriented to person, place, and time. She appears well-developed and well-nourished.  Non-toxic appearance. She does not appear ill. No distress.  HENT:  Head: Normocephalic and atraumatic.  Right Ear: External ear normal.  Left Ear: External ear normal.  Nose: Nose normal. No mucosal edema or rhinorrhea.  Mouth/Throat: Mucous membranes are normal. No dental  abscesses or uvula swelling.  Eyes: Conjunctivae and EOM are normal.  Neck: Normal range of motion and full passive range of motion without pain.  Pulmonary/Chest: Effort normal. No respiratory distress. She has no rhonchi. She exhibits no crepitus.  Abdominal: Soft. Normal appearance.  Musculoskeletal: Normal range of motion. She exhibits tenderness. She exhibits no edema.  Moves all extremities well except for her right upper extremity. Patient is tender to palpation in her right shoulder. Her right clavicle is nontender. Her right elbow is nontender and without effusion. She has no deformity of her upper extremity. She does have limited range of motion because it hurts in her right shoulder. Patient has a superficial abrasion of her right knee without pain to palpation or effusion.  Neurological: She is alert and oriented to person, place, and time. She has normal strength. No cranial nerve deficit.  Skin: Skin is warm, dry and intact. No rash noted. No erythema. No pallor.  Psychiatric: She has a normal mood and affect. Her speech is normal and behavior is normal. Her mood appears not anxious.  Nursing note and vitals reviewed.   ED Course  Procedures (including critical care time)  Medications  ondansetron (ZOFRAN) injection 4 mg (4 mg Intravenous  Given 10/11/14 0116)  fentaNYL (SUBLIMAZE) injection 50 mcg (50 mcg Nasal Given 10/11/14 0116)  oxyCODONE-acetaminophen (PERCOCET/ROXICET) 5-325 MG per tablet 1 tablet (1 tablet Oral Given 10/11/14 0302)    Patient was shown her x-ray. She was placed in a sling. We discussed follow-up with orthopedics for continued care for her fracture. Patient seems to think her pain should be a 0, I explained to her she has a fracture and I will not be able to get her pain to a 0 but it should be tolerable.  Review of the [place] shows patient gets #90 oxycodone 5/325 monthly, the last was August 1, she also gets fentanyl 25 g per hour patches monthly, the last was August 1, and #60 Valium 5 mg tablets monthly the last was August 22. All are prescribed by her PCP.    Labs Review Labs Reviewed - No data to display  Imaging Review Dg Shoulder Right  10/11/2014   CLINICAL DATA:  Right shoulder pain after trip and fall injury. Patient fell over dog.  EXAM: RIGHT SHOULDER - 2+ VIEW  COMPARISON:  None.  FINDINGS: Comminuted fractures of the right humeral head and neck with impaction of major fractures and displacement of greater tuberosity fragment. No significant displacement or angulation of the fracture fragments. No evidence of dislocation of the right shoulder joint. Acromioclavicular and coracoclavicular spaces appear maintained. Increased subacromial space may be due to an effusion.  IMPRESSION: Acute impacted comminuted fracture of the right humeral head and neck.   Electronically Signed   By: Lucienne Capers M.D.   On: 10/11/2014 01:30   Dg Humerus Right  10/11/2014   CLINICAL DATA:  Right shoulder pain after trip and fall injury.  EXAM: RIGHT HUMERUS - 2+ VIEW  COMPARISON:  None.  FINDINGS: Acute comminuted fracture of the right humeral head and neck with impaction of fracture fragments. Midshaft and distal right humerus appears intact. Visualize ribs appear intact.  IMPRESSION: Fracture the right  humeral head and neck. Remainder the humerus is otherwise intact.   Electronically Signed   By: Lucienne Capers M.D.   On: 10/11/2014 01:31   I have personally reviewed and evaluated these images and lab results as part of my medical decision-making.  EKG Interpretation None      MDM   Final diagnoses:  Humerus surgical neck fracture, right, closed, initial encounter  Fall at home, initial encounter   New Prescriptions   OXYCODONE (ROXICODONE) 5 MG IMMEDIATE RELEASE TABLET    Take 1 tablet (5 mg total) by mouth every 6 (six) hours as needed for moderate pain or severe pain.    Plan discharge  Rolland Porter, MD, Barbette Or, MD 10/11/14 [phone]

## 2014-10-11 NOTE — ED Notes (Signed)
Discharge instructions given, pt demonstrated teach back and verbal understanding. No concerns voiced.  

## 2014-10-11 NOTE — Discharge Instructions (Signed)
Continue your fentanyl patch and your Percocet for pain. I gave you an additional oxycodone to take as needed for severe pain. Please be careful could you can overdose quickly with the amount of narcotics you are taking. Wear the sling for comfort. Use ice packs on your shoulder where it is fractured. Call Dr. Ruthe Mannan office to get a appointment to have him follow your fracture. Dr. Aline Brochure is the orthopedist on call tonight.  Return to the emergency department if you get pain in your right fingers or they get pale and cold to touch.  Humerus Fracture, Treated with Immobilization The humerus is the large bone in the upper arm. A broken (fractured) humerus is often treated by wearing a cast, splint, or sling (immobilization). This holds the broken pieces in place so they can heal.  HOME CARE  Put ice on the injured area.  Put ice in a plastic bag.  Place a towel between your skin and the bag.  Leave the ice on for 15-20 minutes, 03-04 times a day.  Only take medicine as told by your doctor.  Do exercises as told by your doctor.  Follow up as told by your doctor. GET HELP RIGHT AWAY IF:   Your skin or fingernails turn blue or gray.  Your arm feels cold or numb.  You have very bad pain in the injured arm.  You are having problems with the medicines you were given. MAKE SURE YOU:   Understand these instructions.  Will watch your condition.  Will get help right away if you are not doing well or get worse. Document Released: 07/10/2007 Document Revised: 04/15/2011 Document Reviewed: 03/07/2010 [facility] Patient Information 2015 [place], [place]. This information is not intended to replace advice given to you by your health care provider. Make sure you discuss any questions you have with your health care provider.

## 2014-10-12 ENCOUNTER — Telehealth: Payer: Self-pay | Admitting: Orthopedic Surgery

## 2014-10-12 NOTE — Telephone Encounter (Signed)
Call received from patient and daughter, Juliann Pulse, requesting appointment for problem of shoulder fracture per Emergency Room visit at [facility], date of injury 10/10/14.  Offered appintment upon receipt of referral from primary care, Dr Woody Seller.  Appointment pending.  Ph# [phone].

## 2014-10-13 NOTE — Telephone Encounter (Signed)
Patient was advised by primary care, Dr Woody Seller, to be seen at their office prior to referring for Emergency room follow up for fracture at our office; her appointment is tomorrow, 10/14/14.  Referral to follow.

## 2014-10-17 NOTE — Telephone Encounter (Signed)
Spoke w/patient/daughter; appointment scheduled.

## 2014-10-18 ENCOUNTER — Encounter: Payer: Self-pay | Admitting: Orthopedic Surgery

## 2014-10-18 ENCOUNTER — Ambulatory Visit (INDEPENDENT_AMBULATORY_CARE_PROVIDER_SITE_OTHER): Payer: Medicare Other | Admitting: Orthopedic Surgery

## 2014-10-18 VITALS — BP 116/64 | Ht 60.0 in | Wt 170.0 lb

## 2014-10-18 DIAGNOSIS — S42201A Unspecified fracture of upper end of right humerus, initial encounter for closed fracture: Secondary | ICD-10-CM | POA: Diagnosis not present

## 2014-10-18 NOTE — Progress Notes (Signed)
Patient ID: [name], female   DOB: 1945-08-24, 69 y.o.   MRN: [id number] NEW   Chief Complaint  Patient presents with  . Shoulder Injury    er follow up right humerus fx, DOI 10/11/14     [name] is a 69 y.o. female.   HPI 69 year old female with multiple medical problems home oxygen fell over her dogs on the sixth. X-rays were done and the films from the hospital show a proximal humerus fracture which meets the near criteria for nonoperative treatment. She complains of right shoulder dull aching pain moderate to severe of one week's duration. She was placed in a sling but she took the sling off  Review of systems shortness of breath when not on oxygen pain radiating down into the arm no numbness or tingling in the   Review of Systems See hpi  Past Medical History  Diagnosis Date  . GERD (gastroesophageal reflux disease)   . Allergic rhinitis   . Pneumonia   . Hypertension   . Diabetes mellitus, type 2   . 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 Duodenal ulcer   . Hiatal hernia   . Diverticulosis   . IBS (irritable bowel syndrome)   . Diverticulitis 2001  . Anxiety   . Chronic airway obstruction, not elsewhere classified   . 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 Tubulovillous adenoma of colon 1998    Past Surgical History  Procedure Laterality Date  . Cesarean section  1971  . Abdominal hysterectomy  1980  . Colectomy  07/1999    Sigmoid-chronic diverticulitis  . Rod in left leg    . Cholecystectomy      ERCP sphincterotomy, stone extraction 2006  . Endoscopic retrograde cholangiopancreatography (ercp) with propofol  11/06/2004    RMR: Normal appearing ampulla/Mildly diffusely dilated biliary tree/ status post sphincterotomy and stone extraction as  . Esophagogastroduodenoscopy  09/1999    [place]: Gastritis, mild duodenitis  . Colonoscopy  05/1999    [place]: Multiple colonic polys  (benign polypoid mucosa), very tortuous sigmoid colon  . Colonoscopy  01/13/2012    Procedure: COLONOSCOPY;  Surgeon: Daneil Dolin, MD;  [facility];  Service: Endoscopy;  Laterality: N/A;  10:30  . Cataract extraction w/phaco Left 08/02/2013    Procedure: CATARACT EXTRACTION PHACO AND INTRAOCULAR LENS PLACEMENT (IOC);  Surgeon: Williams Che, MD;  Location: [facility];  Service: Ophthalmology;  Laterality: Left;  CDE 2.57  . Cataract extraction w/phaco Right 12/20/2013    Procedure: CATARACT EXTRACTION PHACO AND INTRAOCULAR LENS PLACEMENT; CDE: 2.56;  Surgeon: Williams Che, MD;  Location: [facility];  Service: Ophthalmology;  Laterality: Right;    Family History  Problem Relation Age of Onset  . Aneurysm Mother   . Lung cancer Father   . Diabetes Sister   . Diabetes Sister     Social History Social History  Substance Use Topics  . Smoking status: Former Smoker -- 0.50 packs/day    Types: Cigarettes    Quit date: 07/01/2010  . Smokeless tobacco: None     Comment: Quit x 1.5 years  . Alcohol Use: No    Allergies  Allergen Reactions  . Aspirin Other (See Comments)    History of gastric ulcers  . Nsaids     Hx of gastric ulcers    Current Outpatient Prescriptions  Medication Sig Dispense Refill  .  albuterol (PROVENTIL HFA;VENTOLIN HFA) 108 (90 BASE) MCG/ACT inhaler Inhale 2 puffs into the lungs every 6 (six) hours as needed. Shortness of Breath    . albuterol (PROVENTIL) (2.5 MG/3ML) 0.083% nebulizer solution Take 2.5 mg by nebulization daily as needed for wheezing or shortness of breath.    Marland Kitchen atorvastatin (LIPITOR) 10 MG tablet Take 10 mg by mouth daily.    . ATROVENT HFA 17 MCG/ACT inhaler Take 3 mLs by nebulization 4 (four) times daily as needed. Shortness of Breath    . cetirizine (ZYRTEC) 10 MG tablet Take 10 mg by mouth at bedtime.     . diazepam (VALIUM) 5 MG tablet Take 5 mg by mouth 2 (two) times daily.    . fentaNYL (DURAGESIC - DOSED MCG/HR) 25 MCG/HR  patch Place 25 mcg onto the skin every 3 (three) days.    Marland Kitchen FLUoxetine (PROZAC) 20 MG capsule Take 60 mg by mouth daily.     . Fluticasone Furoate-Vilanterol (BREO ELLIPTA) 100-25 MCG/INH AEPB Inhale 1 puff into the lungs daily.    Marland Kitchen glipiZIDE (GLUCOTROL) 10 MG tablet Take 10 mg by mouth 2 (two) times daily before a meal.    . hydrochlorothiazide 25 MG tablet Take 12.5 mg by mouth at bedtime.     . insulin aspart (NOVOLOG) 100 UNIT/ML injection Inject into the skin 3 (three) times daily before meals.    . insulin glargine (LANTUS) 100 UNIT/ML injection Inject into the skin at bedtime.    Marland Kitchen lisinopril (PRINIVIL,ZESTRIL) 20 MG tablet Take 20 mg by mouth at bedtime.      Marland Kitchen OLANZapine (ZYPREXA) 10 MG tablet Take 10 mg by mouth at bedtime.      Marland Kitchen omeprazole (PRILOSEC) 20 MG capsule Take 20 mg by mouth daily.      Marland Kitchen oxyCODONE (ROXICODONE) 5 MG immediate release tablet Take 1 tablet (5 mg total) by mouth every 6 (six) hours as needed for moderate pain or severe pain. 15 tablet 0  . oxyCODONE-acetaminophen (PERCOCET/ROXICET) 5-325 MG per tablet Take 1 tablet by mouth 3 (three) times daily. Pain    . theophylline (THEO-24) 200 MG 24 hr capsule Take 200 mg by mouth 2 (two) times daily.    Marland Kitchen tiotropium (SPIRIVA) 18 MCG inhalation capsule Place 18 mcg into inhaler and inhale daily.       No current facility-administered medications for this visit.   Facility-Administered Medications Ordered in Other Visits  Medication Dose Route Frequency Provider Last Rate Last Dose  . mupirocin ointment (BACTROBAN) 2 %   Nasal BID Williams Che, MD           Physical Exam Blood pressure 116/64, height 5' (1.524 m), weight 170 lb (77.111 kg). Physical Exam The patient is well developed well nourished and well groomed. Orientation to person place and time is normal  Mood is pleasant. Ambulatory status is normal without a limp Right shoulder exam: Tenderness in the proximal humerus some ecchymosis in the scan  no deformity when compared to the opposite left arm Joint range of motion is deferred in the shoulder but normal in the elbow with some discomfort normal in the wrist and hand Stability of the right shoulder not tested secondary to pain elbow wrist normal Muscle tone remains intact and motor strength is normal in the hand and wrist Neurovascular exam remains intact  Data Reviewed Hospital imaging. For treatment purposes my independent interpretation right proximal humerus fracture involves surgical neck and greater tuberosity with each fragment less than 45  1 cm displaced  Assessment This patient is not a good surgical candidate and we will do all things to make this of nonoperative treatment. I placed her in a sling she came in without 1. Plan I emphasized need to wear the sling to be compliant  Sling applied  Come back for x-rays and couple weeks

## 2014-11-01 ENCOUNTER — Ambulatory Visit (INDEPENDENT_AMBULATORY_CARE_PROVIDER_SITE_OTHER): Payer: Medicare Other

## 2014-11-01 ENCOUNTER — Ambulatory Visit (INDEPENDENT_AMBULATORY_CARE_PROVIDER_SITE_OTHER): Payer: Medicare Other | Admitting: Orthopedic Surgery

## 2014-11-01 VITALS — BP 156/88 | Ht 61.0 in | Wt 170.0 lb

## 2014-11-01 DIAGNOSIS — S42201S Unspecified fracture of upper end of right humerus, sequela: Secondary | ICD-10-CM

## 2014-11-01 DIAGNOSIS — S4291XD Fracture of right shoulder girdle, part unspecified, subsequent encounter for fracture with routine healing: Secondary | ICD-10-CM

## 2014-11-01 NOTE — Patient Instructions (Signed)
Call [facility] therapy dept to arrange therapy visits

## 2014-11-01 NOTE — Progress Notes (Signed)
Patient ID: [name], female   DOB: 1945/11/08, 69 y.o.   MRN: [id number]  Follow up visit  Chief Complaint  Patient presents with  . Follow-up    2 week recheck on right shoulder fracture with xray, DOI 10-11-14.    BP 156/88 mmHg  Ht 5\' 1"  (1.549 m)  Wt 170 lb (77.111 kg)  BMI 32.14 kg/m2  Encounter Diagnosis  Name Primary?  . Shoulder fracture, right, with routine healing, subsequent encounter Yes    Proximal humerus fracture doing well treated with a sling okay to start therapy x-rays show stable fracture  Follow-up 8 weeks, start physical therapy try at home first if not started any pain with OT, 3 times a week 6 weeks.

## 2014-11-01 NOTE — Addendum Note (Signed)
Addended by: Baldomero Lamy B on: 11/01/2014 10:51 AM   Modules accepted: Orders

## 2014-11-07 ENCOUNTER — Ambulatory Visit (HOSPITAL_COMMUNITY): Payer: Medicare Other | Admitting: Specialist

## 2014-12-14 ENCOUNTER — Ambulatory Visit (HOSPITAL_COMMUNITY): Payer: Medicare Other | Attending: Orthopedic Surgery | Admitting: Occupational Therapy

## 2014-12-14 ENCOUNTER — Encounter (HOSPITAL_COMMUNITY): Payer: Self-pay | Admitting: Occupational Therapy

## 2014-12-14 DIAGNOSIS — M25511 Pain in right shoulder: Secondary | ICD-10-CM | POA: Insufficient documentation

## 2014-12-14 DIAGNOSIS — M25611 Stiffness of right shoulder, not elsewhere classified: Secondary | ICD-10-CM | POA: Diagnosis present

## 2014-12-14 DIAGNOSIS — M6289 Other specified disorders of muscle: Secondary | ICD-10-CM

## 2014-12-14 DIAGNOSIS — M6281 Muscle weakness (generalized): Secondary | ICD-10-CM | POA: Diagnosis present

## 2014-12-14 DIAGNOSIS — S42201S Unspecified fracture of upper end of right humerus, sequela: Secondary | ICD-10-CM | POA: Insufficient documentation

## 2014-12-14 DIAGNOSIS — M629 Disorder of muscle, unspecified: Secondary | ICD-10-CM | POA: Insufficient documentation

## 2014-12-14 DIAGNOSIS — X58XXXS Exposure to other specified factors, sequela: Secondary | ICD-10-CM | POA: Diagnosis not present

## 2014-12-14 NOTE — Patient Instructions (Signed)
SHOULDER: Flexion On Table   Place hands on table, elbows straight. Move hips away from body. Press hands down into table.  _10-15__ reps per set, _2__ sets per day  Abduction (Passive)   With arm out to side, resting on table, lower head toward arm, keeping trunk away from table. Repeat __10-15__ times. Do __2__ sessions per day.  Copyright  VHI. All rights reserved.     Internal Rotation (Assistive)   Seated with elbow bent at right angle and held against side, slide arm on table surface in an inward arc. Repeat _10-15___ times. Do __2__ sessions per day. Activity: Use this motion to brush crumbs off the table.  Copyright  VHI. All rights reserved.    

## 2014-12-14 NOTE — Therapy (Signed)
[name], [place], [address] Phone: [phone]   Fax:  [phone]  Occupational Therapy Evaluation  Patient Details  Name: [name] MRN: [id number] Date of Birth: [birth year] Referring Provider: Dr. Aline Brochure  Encounter Date: 12/14/2014      OT End of Session - 12/14/14 1614    Visit Number 1   Number of Visits 16   Date for OT Re-Evaluation 02/12/15  mini reassess 01/11/2015   Authorization Type Medicare    Authorization Time Period Before 10th visit   Authorization - Visit Number 1   Authorization - Number of Visits 10   OT Start Time 1432   OT Stop Time 1507   OT Time Calculation (min) 35 min   Activity Tolerance Patient tolerated treatment well   Behavior During Therapy [facility] for tasks assessed/performed      Past Medical History  Diagnosis Date  . GERD (gastroesophageal reflux disease)   . Allergic rhinitis   . Pneumonia   . Hypertension   . Diabetes mellitus, type 2 ([place])   . 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 Segmental colitis ([place]) 07/1996  . Duodenal ulcer   . Hiatal hernia   . Diverticulosis   . IBS (irritable bowel syndrome)   . Diverticulitis 2001  . Anxiety   . Chronic airway obstruction, not elsewhere classified   . 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 Tubulovillous adenoma of colon 1998    Past Surgical History  Procedure Laterality Date  . Cesarean section  1971  . Abdominal hysterectomy  1980  . Colectomy  07/1999    Sigmoid-chronic diverticulitis  . Rod in left leg    . Cholecystectomy      ERCP sphincterotomy, stone extraction 2006  . Endoscopic retrograde cholangiopancreatography (ercp) with propofol  11/06/2004    RMR: Normal appearing ampulla/Mildly diffusely dilated biliary tree/ status post sphincterotomy and stone extraction as  . Esophagogastroduodenoscopy  09/1999    [place]: Gastritis, mild duodenitis   . Colonoscopy  05/1999    [place]: Multiple colonic polys (benign polypoid mucosa), very tortuous sigmoid colon  . Colonoscopy  01/13/2012    Procedure: COLONOSCOPY;  Surgeon: Daneil Dolin, MD;  Location: [facility];  Service: Endoscopy;  Laterality: N/A;  10:30  . Cataract extraction w/phaco Left 08/02/2013    Procedure: CATARACT EXTRACTION PHACO AND INTRAOCULAR LENS PLACEMENT (IOC);  Surgeon: Williams Che, MD;  Location: [facility] ORS;  Service: Ophthalmology;  Laterality: Left;  CDE 2.57  . Cataract extraction w/phaco Right 12/20/2013    Procedure: CATARACT EXTRACTION PHACO AND INTRAOCULAR LENS PLACEMENT; CDE: 2.56;  Surgeon: Williams Che, MD;  Location: [facility] ORS;  Service: Ophthalmology;  Laterality: Right;    There were no vitals filed for this visit.  Visit Diagnosis:  Proximal humerus fracture, right, sequela  Pain in right shoulder  Stiffness of right shoulder joint  Tight fascia  Muscle weakness of right upper extremity      Subjective Assessment - 12/14/14 1610    Subjective  S: I tripped over my dog when this happened.    Pertinent History Pt is a 69 y/o female s/p proximal humerus fracture on 10/11/2014 presenting with difficulty completing ADL tasks. Pt uses medication and ice for pain management. Pt was referred to occupational therapy by Dr. Aline Brochure for evaluation and treatment.    Special Tests FOTO Score: 26/100 (74% impairment)  Patient Stated Goals to be able to wash my hair again   Currently in Pain? Yes   Pain Score 3    Pain Location Shoulder   Pain Orientation Right   Pain Descriptors / Indicators Aching   Pain Type Acute pain           [facility] OT Assessment - 12/14/14 1435    Assessment   Diagnosis Right proximal humerus fracture   Referring Provider Dr. Aline Brochure   Onset Date 10/11/14   Prior Therapy None   Precautions   Precautions None   Shoulder Interventions Shoulder sling/immobilizer  pt removed this week (12/12/2014)   Restrictions    Weight Bearing Restrictions No   Balance Screen   Has the patient fallen in the past 6 months Yes   How many times? 2   Has the patient had a decrease in activity level because of a fear of falling?  No   Is the patient reluctant to leave their home because of a fear of falling?  No   Home  Environment   Family/patient expects to be discharged to: Private residence   Living Arrangements Spouse/significant other   Available Help at Discharge Family   Prior Function   Level of [place] with basic ADLs   Vocation Unemployed   Leisure watching tv   ADL   ADL comments Pt is having difficulty with dressing tasks, grooming tasks, washing & fixing hair, reaching up and out, lifting lightweight objects.    Written Expression   Dominant Hand Right   Vision - History   Baseline Vision No visual deficits   Cognition   Overall Cognitive Status Within Functional Limits for tasks assessed   ROM / Strength   AROM / PROM / Strength AROM;PROM;Strength   Palpation   Palpation comment Max fascial restrictions in right upper arm and trapezius regions.    AROM   Overall AROM Comments Assessed in supine, ER/IR adducted   AROM Assessment Site Shoulder   Right/Left Shoulder Right   Right Shoulder Flexion 87 Degrees   Right Shoulder ABduction 67 Degrees   Right Shoulder Internal Rotation 90 Degrees   Right Shoulder External Rotation 20 Degrees   PROM   Overall PROM Comments Assessed in supine, ER/IR adducted   PROM Assessment Site Shoulder   Right/Left Shoulder Right   Right Shoulder Flexion 113 Degrees   Right Shoulder ABduction 88 Degrees   Right Shoulder Internal Rotation 90 Degrees   Right Shoulder External Rotation 44 Degrees   Strength   Overall Strength Comments Not tested this date   Strength Assessment Site Shoulder   Right/Left Shoulder Right                             OT Education - 12/14/14 1613    Education provided Yes   Education Details table slides     Person(s) Educated Patient   Methods Explanation;Demonstration;Handout   Comprehension Verbalized understanding;Returned demonstration          OT Short Term Goals - 12/14/14 1619    OT SHORT TERM GOAL #1   Title Pt will be educated on and independent in HEP.    Time 4   Period Weeks   Status New   OT SHORT TERM GOAL #2   Title Pt will decrease fascial restrictions from max to moderate amounts to increase ability to obtain maximum passive stretch.    Time 4   Period Weeks  Status New   OT SHORT TERM GOAL #3   Title Pt will decrease pain to 4/10 during daily tasks to increase ability to perform ADL tasks with greater independence.    Time 4   Period Weeks   Status New   OT SHORT TERM GOAL #4   Title Pt will increase A/ROM to [facility] to increase ability to perform grooming tasks such as washing her hair and performing oral hygiene.    Time 4   Period Weeks   Status New   OT SHORT TERM GOAL #5   Title Pt will increase strength to 3/5 to increase her ability to donn shirts independently.    Time 4   Period Weeks   Status New           OT Long Term Goals - 12-21-14 1622    OT LONG TERM GOAL #1   Title Pt will return to prior level of functioning and independence using right arm as dominant during daily tasks.    Time 8   Period Weeks   Status New   OT LONG TERM GOAL #2   Title Pt will decrease fascial restrictions from mod to min amounts or less to increase ROM during passive stretching.    Time 8   Period Weeks   Status New   OT LONG TERM GOAL #3   Title Pt will decrease pain to 2/10 or less to increase ability to perform daily tasks.    Time 8   Period Weeks   Status New   OT LONG TERM GOAL #4   Title Pt will increase RUE A/ROM to WNL to increase ability to reach into overhead cabinets.    Time 8   Period Weeks   Status New   OT LONG TERM GOAL #5   Title Pt will increase RUE strength to 4/5 to increase her ability to use her RUE as dominant during daily  tasks.    Time 8   Period Weeks   Status New               Plan - December 21, 2014 1615    Clinical Impression Statement A: Pt is a 69 y/o female s/p proximal humerus fracture on 10/11/2014, presenting with increased pain and fascial restrictions, decrease range of motion, strength, and functional use of RUE limiting her ability to complete her daily tasks. Pt was provided with and educated on table slides for HEP. Pt reports she is unable to come to therapy 3x/week as MD suggests, will try to come 2x/week.    Pt will benefit from skilled therapeutic intervention in order to improve on the following deficits (Retired) Decreased strength;Pain;Impaired UE functional use;Decreased range of motion;Increased fascial restricitons;Impaired flexibility   Rehab Potential Good   OT Frequency 2x / week   OT Duration 8 weeks   OT Treatment/Interventions Self-care/ADL training;Passive range of motion;Patient/family education;Cryotherapy;Electrical Stimulation;Moist Heat;Therapeutic exercise;Manual Therapy;Therapeutic activities   Plan P: Pt will benefit from skilled OT services to decrease pain and fascial restrictions, increase range of motion, strength , and ability to use RUE as dominant. Treatment plan: myofascial release, manual stretching, P/ROM, AA/ROM, A/ROM, general RUE strengthening, proximal shoulder strengthening and stability, and scapular strengthening.    OT Home Exercise Plan table slides   Consulted and Agree with Plan of Care Patient          G-Codes - 12/21/14 1626    Functional Assessment Tool Used FOTO Score: 56/100 (74% impairment)   Functional Limitation Carrying, moving  and handling objects   Carrying, Moving and Handling Objects Current Status [phone]) At least 21 percent but less than 80 percent impaired, limited or restricted   Carrying, Moving and Handling Objects Goal Status ([id number]) At least 20 percent but less than 40 percent impaired, limited or restricted      Problem  List Patient Active Problem List   Diagnosis Date Noted  . 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 Tachycardia 12/02/2010  . Transaminitis 12/01/2010  . COPD with exacerbation ([place]) 11/29/2010  . Hyponatremia 11/29/2010  . Back pain, chronic 11/29/2010  . Hypertension 11/29/2010  . Oral candidiasis 11/29/2010    [name], OTR/L  [phone]  12/14/2014, 4:27 PM  [facility] [address] [place], [place], [address] Phone: [phone]   Fax:  [phone]  Name: MAGALINE STEINBERG MRN: [id number] Date of Birth: 01-20-1946

## 2014-12-21 ENCOUNTER — Telehealth (HOSPITAL_COMMUNITY): Payer: Self-pay | Admitting: Occupational Therapy

## 2014-12-21 ENCOUNTER — Ambulatory Visit (HOSPITAL_COMMUNITY): Payer: Medicare Other | Admitting: Occupational Therapy

## 2014-12-21 NOTE — Telephone Encounter (Signed)
She called and cx this apptment due to her COPD. NF

## 2014-12-23 ENCOUNTER — Ambulatory Visit (HOSPITAL_COMMUNITY): Payer: Medicare Other | Admitting: Occupational Therapy

## 2014-12-23 ENCOUNTER — Encounter (HOSPITAL_COMMUNITY): Payer: Self-pay | Admitting: Occupational Therapy

## 2014-12-23 DIAGNOSIS — M629 Disorder of muscle, unspecified: Secondary | ICD-10-CM

## 2014-12-23 DIAGNOSIS — M25511 Pain in right shoulder: Secondary | ICD-10-CM

## 2014-12-23 DIAGNOSIS — S42201S Unspecified fracture of upper end of right humerus, sequela: Secondary | ICD-10-CM | POA: Diagnosis not present

## 2014-12-23 DIAGNOSIS — M6289 Other specified disorders of muscle: Secondary | ICD-10-CM

## 2014-12-23 DIAGNOSIS — M25611 Stiffness of right shoulder, not elsewhere classified: Secondary | ICD-10-CM

## 2014-12-23 DIAGNOSIS — M6281 Muscle weakness (generalized): Secondary | ICD-10-CM

## 2014-12-23 NOTE — Patient Instructions (Signed)
1) Seated Row   Sit up straight with elbows by your sides. Pull back with shoulders/elbows, keeping forearms straight, as if pulling back on the reins of a horse. Squeeze shoulder blades together. Repeat _10__times, __2__sets/day    2) Shoulder Elevation    Sit up straight with arms by your sides. Slowly bring your shoulders up towards your ears. Repeat_10__times, __2__ sets/day    3) Shoulder Extension    Sit up straight with both arms by your side, draw your arms back behind your waist. Keep your elbows straight. Repeat __10__times, _2___sets/day.        

## 2014-12-23 NOTE — Therapy (Signed)
[place], [place], [address] Phone: [phone]   Fax:  [phone]  Occupational Therapy Treatment  Patient Details  Name: [name] MRN: [id number] Date of Birth: 07/25/1945 Referring Provider: Dr. Aline Brochure  Encounter Date: 12/23/2014      OT End of Session - 12/23/14 1503    Visit Number 2   Number of Visits 16   Date for OT Re-Evaluation 02/12/15  mini reassess 01/11/2015   Authorization Type Medicare    Authorization Time Period Before 10th visit   Authorization - Visit Number 2   Authorization - Number of Visits 10   OT Start Time 1347   OT Stop Time 1425   OT Time Calculation (min) 38 min   Activity Tolerance Patient tolerated treatment well   Behavior During Therapy [facility] for tasks assessed/performed      Past Medical History  Diagnosis Date  . GERD (gastroesophageal reflux disease)   . Allergic rhinitis   . Pneumonia   . Hypertension   . Diabetes mellitus, type 2 ([place])   . 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 Segmental colitis ([place]) 07/1996  . Duodenal ulcer   . Hiatal hernia   . Diverticulosis   . IBS (irritable bowel syndrome)   . Diverticulitis 2001  . Anxiety   . Chronic airway obstruction, not elsewhere classified   . 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 Tubulovillous adenoma of colon 1998    Past Surgical History  Procedure Laterality Date  . Cesarean section  1971  . Abdominal hysterectomy  1980  . Colectomy  07/1999    Sigmoid-chronic diverticulitis  . Rod in left leg    . Cholecystectomy      ERCP sphincterotomy, stone extraction 2006  . Endoscopic retrograde cholangiopancreatography (ercp) with propofol  11/06/2004    RMR: Normal appearing ampulla/Mildly diffusely dilated biliary tree/ status post sphincterotomy and stone extraction as  . Esophagogastroduodenoscopy  09/1999    [place]: Gastritis, mild duodenitis   . Colonoscopy  05/1999    : Multiple colonic polys (benign polypoid mucosa), very tortuous sigmoid colon  . Colonoscopy  01/13/2012    Procedure: COLONOSCOPY;  Surgeon: Daneil Dolin, MD;  Location[facility];  Service: Endoscopy;  Laterality: N/A;  10:30  . Cataract extraction w/phaco Left 08/02/2013    Procedure: CATARACT EXTRACTION PHACO AND INTRAOCULAR LENS PLACEMENT (IOC);  Surgeon: Williams Che, MD;  Location: [facility] ORS;  Service: Ophthalmology;  Laterality: Left;  CDE 2.57  . Cataract extraction w/phaco Right 12/20/2013    Procedure: CATARACT EXTRACTION PHACO AND INTRAOCULAR LENS PLACEMENT; CDE: 2.56;  Surgeon: Williams Che, MD;  Location: [facility] ORS;  Service: Ophthalmology;  Laterality: Right;    There were no vitals filed for this visit.  Visit Diagnosis:  Pain in right shoulder  Stiffness of right shoulder joint  Tight fascia  Muscle weakness of right upper extremity      Subjective Assessment - 12/23/14 1346    Subjective  S: Those exercises you gave me were really easy, I need something harder.    Currently in Pain? Yes   Pain Score 1    Pain Location Shoulder   Pain Orientation Right   Pain Descriptors / Indicators Aching   Pain Type Acute pain            [facility] OT Assessment - 12/23/14 1346    Assessment  Diagnosis Right proximal humerus fracture   Precautions   Precautions None                  OT Treatments/Exercises (OP) - 12/23/14 1350    Exercises   Exercises Shoulder   Shoulder Exercises: Supine   Protraction PROM;10 reps;AAROM;5 reps   Horizontal ABduction PROM;10 reps   External Rotation PROM;10 reps;AAROM;5 reps   Internal Rotation PROM;10 reps;AAROM;5 reps   Flexion PROM;10 reps;AAROM;5 reps   ABduction PROM;10 reps;AAROM;5 reps   Shoulder Exercises: Seated   Elevation AROM;10 reps   Extension AROM;10 reps   Row AROM;10 reps   Shoulder Exercises: Therapy Ball   Flexion 10 reps   ABduction 10 reps   Shoulder  Exercises: Isometric Strengthening   Flexion Supine;3X3"   Extension Supine;3X3"   External Rotation Supine;3X3"   Internal Rotation Supine;3X3"   ABduction Supine;3X3"   ADduction Supine;3X3"   Manual Therapy   Manual Therapy Myofascial release   Manual therapy comments Manual therapy completed prior to exercises   Myofascial Release Myofascial release to right upper arm, trapezius, and scapularis regions to decrease pain and fascial restrictions and increase joint range of motion.                OT Education - 12/23/14 1417    Education provided Yes   Education Details scapular A/ROM    Person(s) Educated Patient   Methods Explanation;Demonstration;Handout   Comprehension Verbalized understanding;Returned demonstration          OT Short Term Goals - 12/23/14 1520    OT SHORT TERM GOAL #1   Title Pt will be educated on and independent in [place].    Time 4   Period Weeks   Status On-going   OT SHORT TERM GOAL #2   Title Pt will decrease fascial restrictions from max to moderate amounts to increase ability to obtain maximum passive stretch.    Time 4   Period Weeks   Status On-going   OT SHORT TERM GOAL #3   Title Pt will decrease pain to 4/10 during daily tasks to increase ability to perform ADL tasks with greater independence.    Time 4   Period Weeks   Status On-going   OT SHORT TERM GOAL #4   Title Pt will increase A/ROM to [facility] to increase ability to perform grooming tasks such as washing her hair and performing oral hygiene.    Time 4   Period Weeks   Status On-going   OT SHORT TERM GOAL #5   Title Pt will increase strength to 3/5 to increase her ability to donn shirts independently.    Time 4   Period Weeks   Status On-going           OT Long Term Goals - 12/23/14 1521    OT LONG TERM GOAL #1   Title Pt will return to prior level of functioning and independence using right arm as dominant during daily tasks.    Time 8   Period Weeks   Status  On-going   OT LONG TERM GOAL #2   Title Pt will decrease fascial restrictions from mod to min amounts or less to increase ROM during passive stretching.    Time 8   Period Weeks   Status On-going   OT LONG TERM GOAL #3   Title Pt will decrease pain to 2/10 or less to increase ability to perform daily tasks.    Time 8   Period Weeks  Status On-going   OT LONG TERM GOAL #4   Title Pt will increase RUE A/ROM to WNL to increase ability to reach into overhead cabinets.    Time 8   Period Weeks   Status On-going   OT LONG TERM GOAL #5   Title Pt will increase RUE strength to 4/5 to increase her ability to use her RUE as dominant during daily tasks.    Time 8   Period Weeks   Status On-going               Plan - 12/23/14 1518    Clinical Impression Statement A: Initiated myofascial release, P/ROM, isometric exercises, AA/ROM in supine, and therapy ball stretches this session. Pt reports the table slides are too easy and she needs to get her arm moving. Educated pt on timeline and progression of therapy with her condition. Pt required multiple rest breaks during session due to SOB and fatigue. Provided pt with scapular A/ROM exercises.    Plan P: Continue working to decrease pain and increase P/ROM to [facility]. Add low thumb tacks.         Problem List Patient Active Problem List   Diagnosis Date Noted  . 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 Tachycardia 12/02/2010  . Transaminitis 12/01/2010  . COPD with exacerbation ([place]) 11/29/2010  . Hyponatremia 11/29/2010  . Back pain, chronic 11/29/2010  . Hypertension 11/29/2010  . Oral candidiasis 11/29/2010    [name], OTR/L  [phone]  12/23/2014, 3:21 PM  [facility] [address] [place], [place], [address] Phone: [phone]   Fax:  [phone]  Name: [name] MRN: [id number] Date of Birth: Feb 22, 1945

## 2014-12-27 ENCOUNTER — Encounter: Payer: Self-pay | Admitting: *Deleted

## 2014-12-27 ENCOUNTER — Ambulatory Visit: Payer: Medicare Other | Admitting: Orthopedic Surgery

## 2015-01-04 ENCOUNTER — Encounter (HOSPITAL_COMMUNITY): Payer: Self-pay | Admitting: Occupational Therapy

## 2015-01-04 ENCOUNTER — Ambulatory Visit (HOSPITAL_COMMUNITY): Payer: Medicare Other | Admitting: Occupational Therapy

## 2015-01-04 DIAGNOSIS — S42201S Unspecified fracture of upper end of right humerus, sequela: Secondary | ICD-10-CM | POA: Diagnosis not present

## 2015-01-04 DIAGNOSIS — M6289 Other specified disorders of muscle: Secondary | ICD-10-CM

## 2015-01-04 DIAGNOSIS — M25611 Stiffness of right shoulder, not elsewhere classified: Secondary | ICD-10-CM

## 2015-01-04 DIAGNOSIS — M629 Disorder of muscle, unspecified: Secondary | ICD-10-CM

## 2015-01-04 DIAGNOSIS — M6281 Muscle weakness (generalized): Secondary | ICD-10-CM

## 2015-01-04 DIAGNOSIS — M25511 Pain in right shoulder: Secondary | ICD-10-CM

## 2015-01-04 NOTE — Therapy (Signed)
[facility] [place], [place], [address] Phone: [phone]   Fax:  [phone]  Occupational Therapy Treatment  Patient Details  Name: [name] MRN: [id number] Date of Birth: May 29, 1945 Referring Provider: Dr. Aline Brochure  Encounter Date: 01/04/2015      OT End of Session - 01/04/15 1612    Visit Number 3   Number of Visits 16   Date for OT Re-Evaluation 02/12/15  mini reassess 01/11/2015   Authorization Type Medicare    Authorization Time Period Before 10th visit   Authorization - Visit Number 3   Authorization - Number of Visits 10   OT Start Time 1308   OT Stop Time 1341   OT Time Calculation (min) 33 min   Activity Tolerance Patient tolerated treatment well   Behavior During Therapy [facility] for tasks assessed/performed      Past Medical History  Diagnosis Date  . GERD (gastroesophageal reflux disease)   . Allergic rhinitis   . Pneumonia   . Hypertension   . Diabetes mellitus, type 2 ([place])   . 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 Duodenal ulcer   . Hiatal hernia   . Diverticulosis   . IBS (irritable bowel syndrome)   . Diverticulitis 2001  . Anxiety   . Chronic airway obstruction, not elsewhere classified   . 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 Tubulovillous adenoma of colon 1998    Past Surgical History  Procedure Laterality Date  . Cesarean section  1971  . Abdominal hysterectomy  1980  . Colectomy  07/1999    Sigmoid-chronic diverticulitis  . Rod in left leg    . Cholecystectomy      ERCP sphincterotomy, stone extraction 2006  . Endoscopic retrograde cholangiopancreatography (ercp) with propofol  11/06/2004    RMR: Normal appearing ampulla/Mildly diffusely dilated biliary tree/ status post sphincterotomy and stone extraction as  . Esophagogastroduodenoscopy  09/1999    [place]: Gastritis, mild duodenitis   . Colonoscopy  05/1999    [place]: Multiple colonic polys (benign polypoid mucosa), very tortuous sigmoid colon  . Colonoscopy  01/13/2012    Procedure: COLONOSCOPY;  Surgeon: Daneil Dolin, MD;  Location: [facility] SU[facility];  Service: Endoscopy;  Laterality: N/A;  10:30  . Cataract extraction w/phaco Left 08/02/2013    Procedure: CATARACT EXTRACTION PHACO AND INTRAOCULAR LENS PLACEMENT (IOC);  Surgeon: Williams Che, MD;  Location: [facility] ORS;  Service: Ophthalmology;  Laterality: Left;  CDE 2.57  . Cataract extraction w/phaco Right 12/20/2013    Procedure: CATARACT EXTRACTION PHACO AND INTRAOCULAR LENS PLACEMENT; CDE: 2.56;  Surgeon: Williams Che, MD;  Location: [facility] ORS;  Service: Ophthalmology;  Laterality: Right;    There were no vitals filed for this visit.  Visit Diagnosis:  Pain in right shoulder  Stiffness of right shoulder joint  Tight fascia  Muscle weakness of right upper extremity      Subjective Assessment - 01/04/15 1312    Subjective  S: I did my exercises this morning.    Currently in Pain? Yes   Pain Score 7    Pain Location Shoulder   Pain Orientation Right   Pain Descriptors / Indicators Aching   Pain Type Acute pain            [facility] OT Assessment - 01/04/15 1610    Assessment   Diagnosis Right proximal humerus  fracture   Precautions   Precautions None                  OT Treatments/Exercises (OP) - 01/04/15 1312    Exercises   Exercises Shoulder   Shoulder Exercises: Supine   Protraction PROM;5 reps;AAROM;10 reps   Horizontal ABduction PROM;5 reps;AAROM;10 reps   External Rotation PROM;5 reps;AAROM;10 reps   Internal Rotation PROM;5 reps;AAROM;10 reps   Flexion PROM;5 reps;AAROM;10 reps   ABduction PROM;5 reps;AAROM;10 reps   Shoulder Exercises: Seated   Elevation AROM;15 reps   Extension AROM;15 reps   Row AROM;15 reps   Shoulder Exercises: ROM/Strengthening   Thumb Tacks 1'   Shoulder Exercises: Isometric Strengthening    Flexion Supine;3X5"   Extension Supine;3X5"   External Rotation Supine;3X5"   Internal Rotation Supine;3X5"   ABduction Supine;3X5"   ADduction Supine;3X5"   Manual Therapy   Manual Therapy Myofascial release   Manual therapy comments Manual therapy completed prior to exercises   Myofascial Release Myofascial release to right upper arm, trapezius, and scapularis regions to decrease pain and fascial restrictions and increase joint range of motion.                  OT Short Term Goals - 12/23/14 1520    OT SHORT TERM GOAL #1   Title Pt will be educated on and independent in HEP.    Time 4   Period Weeks   Status On-going   OT SHORT TERM GOAL #2   Title Pt will decrease fascial restrictions from max to moderate amounts to increase ability to obtain maximum passive stretch.    Time 4   Period Weeks   Status On-going   OT SHORT TERM GOAL #3   Title Pt will decrease pain to 4/10 during daily tasks to increase ability to perform ADL tasks with greater independence.    Time 4   Period Weeks   Status On-going   OT SHORT TERM GOAL #4   Title Pt will increase A/ROM to [facility] to increase ability to perform grooming tasks such as washing her hair and performing oral hygiene.    Time 4   Period Weeks   Status On-going   OT SHORT TERM GOAL #5   Title Pt will increase strength to 3/5 to increase her ability to donn shirts independently.    Time 4   Period Weeks   Status On-going           OT Long Term Goals - 12/23/14 1521    OT LONG TERM GOAL #1   Title Pt will return to prior level of functioning and independence using right arm as dominant during daily tasks.    Time 8   Period Weeks   Status On-going   OT LONG TERM GOAL #2   Title Pt will decrease fascial restrictions from mod to min amounts or less to increase ROM during passive stretching.    Time 8   Period Weeks   Status On-going   OT LONG TERM GOAL #3   Title Pt will decrease pain to 2/10 or less to increase  ability to perform daily tasks.    Time 8   Period Weeks   Status On-going   OT LONG TERM GOAL #4   Title Pt will increase RUE A/ROM to WNL to increase ability to reach into overhead cabinets.    Time 8   Period Weeks   Status On-going   OT LONG TERM GOAL #5  Title Pt will increase RUE strength to 4/5 to increase her ability to use her RUE as dominant during daily tasks.    Time 8   Period Weeks   Status On-going               Plan - 01/04/15 1613    Clinical Impression Statement A: Added low thumb tacks this session. Continued to work towards increasing ROM to [facility] during passive stretching. Pt reports she is now able to wash her hair using BUE. Pt requires rest breaks due to SOB and fatigue during session. Pt reports scapular HEP is going well.    Plan P: Add AA/ROM in sitting if able to tolerate.         Problem List Patient Active Problem List   Diagnosis Date Noted  . 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 Tachycardia 12/02/2010  . Transaminitis 12/01/2010  . COPD with exacerbation ([place]) 11/29/2010  . Hyponatremia 11/29/2010  . Back pain, chronic 11/29/2010  . Hypertension 11/29/2010  . Oral candidiasis 11/29/2010    [name], OTR/L  [phone]  01/04/2015, 4:15 PM  [facility] [address] [place], [place], [address] Phone: [phone]   Fax:  [phone]  Name: KARITA BUMGARDNER MRN: [id number] Date of Birth: March 13, 1945

## 2015-01-06 ENCOUNTER — Ambulatory Visit (HOSPITAL_COMMUNITY): Payer: Medicare Other | Admitting: Occupational Therapy

## 2015-01-11 ENCOUNTER — Encounter (HOSPITAL_COMMUNITY): Payer: Self-pay | Admitting: Occupational Therapy

## 2015-01-11 ENCOUNTER — Ambulatory Visit (HOSPITAL_COMMUNITY): Payer: Medicare Other | Attending: Orthopedic Surgery | Admitting: Occupational Therapy

## 2015-01-11 DIAGNOSIS — M629 Disorder of muscle, unspecified: Secondary | ICD-10-CM

## 2015-01-11 DIAGNOSIS — M6289 Other specified disorders of muscle: Secondary | ICD-10-CM

## 2015-01-11 DIAGNOSIS — M6281 Muscle weakness (generalized): Secondary | ICD-10-CM | POA: Diagnosis present

## 2015-01-11 DIAGNOSIS — M25611 Stiffness of right shoulder, not elsewhere classified: Secondary | ICD-10-CM | POA: Diagnosis present

## 2015-01-11 DIAGNOSIS — M25511 Pain in right shoulder: Secondary | ICD-10-CM | POA: Insufficient documentation

## 2015-01-11 NOTE — Therapy (Signed)
[place], [place], [address] Phone: [phone]   Fax:  [phone]  Occupational Therapy Treatment  Patient Details  Name: [name] MRN: [id number] Date of Birth: 06/26/1945 Referring Provider: Dr. Aline Brochure  Encounter Date: 01/11/2015      OT End of Session - 01/11/15 1445    Visit Number 4   Number of Visits 16   Date for OT Re-Evaluation 02/12/15  mini reassess 01/11/2015   Authorization Type Medicare    Authorization Time Period Before 10th visit   Authorization - Visit Number 4   Authorization - Number of Visits 10   OT Start Time 1300   OT Stop Time 1344   OT Time Calculation (min) 44 min   Activity Tolerance Patient tolerated treatment well   Behavior During Therapy [facility] for tasks assessed/performed      Past Medical History  Diagnosis Date  . GERD (gastroesophageal reflux disease)   . Allergic rhinitis   . Pneumonia   . Hypertension   . Diabetes mellitus, type 2 ([place])   . 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 Duodenal ulcer   . Hiatal hernia   . Diverticulosis   . IBS (irritable bowel syndrome)   . Diverticulitis 2001  . Anxiety   . Chronic airway obstruction, not elsewhere classified   . 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 Tubulovillous adenoma of colon 1998    Past Surgical History  Procedure Laterality Date  . Cesarean section  1971  . Abdominal hysterectomy  1980  . Colectomy  07/1999    Sigmoid-chronic diverticulitis  . Rod in left leg    . Cholecystectomy      ERCP sphincterotomy, stone extraction 2006  . Endoscopic retrograde cholangiopancreatography (ercp) with propofol  11/06/2004    RMR: Normal appearing ampulla/Mildly diffusely dilated biliary tree/ status post sphincterotomy and stone extraction as  . Esophagogastroduodenoscopy  09/1999    [place]: Gastritis, mild duodenitis  .  Colonoscopy  05/1999    [place]: Multiple colonic polys (benign polypoid mucosa), very tortuous sigmoid colon  . Colonoscopy  01/13/2012    Procedure: COLONOSCOPY;  Surgeon: Daneil Dolin, MD;  Location: [facility];  Service: Endoscopy;  Laterality: N/A;  10:30  . Cataract extraction w/phaco Left 08/02/2013    Procedure: CATARACT EXTRACTION PHACO AND INTRAOCULAR LENS PLACEMENT (IOC);  Surgeon: Williams Che, MD;  Location: [facility] ORS;  Service: Ophthalmology;  Laterality: Left;  CDE 2.57  . Cataract extraction w/phaco Right 12/20/2013    Procedure: CATARACT EXTRACTION PHACO AND INTRAOCULAR LENS PLACEMENT; CDE: 2.56;  Surgeon: Williams Che, MD;  Location: [facility] ORS;  Service: Ophthalmology;  Laterality: Right;    There were no vitals filed for this visit.  Visit Diagnosis:  Pain in right shoulder  Stiffness of right shoulder joint  Tight fascia  Muscle weakness of right upper extremity      Subjective Assessment - 01/11/15 1259    Subjective  S: My arm hurt so bad through Sunday I didn't know what to do.    Currently in Pain? Yes   Pain Score 7    Pain Location Shoulder   Pain Orientation Right   Pain Descriptors / Indicators Aching   Pain Type Acute pain            [facility] OT Assessment - 01/11/15 1259  Assessment   Diagnosis Right proximal humerus fracture   Precautions   Precautions None                  OT Treatments/Exercises (OP) - 01/11/15 1303    Exercises   Exercises Shoulder   Shoulder Exercises: Supine   Protraction PROM;5 reps;AAROM;15 reps   Horizontal ABduction PROM;5 reps;AAROM;15 reps   External Rotation PROM;5 reps;AAROM;15 reps   Internal Rotation PROM;5 reps;AAROM;15 reps   Flexion PROM;5 reps;AAROM;10 reps   ABduction PROM;5 reps;AAROM;10 reps   Shoulder Exercises: Seated   Elevation AROM;15 reps   Extension AROM;15 reps   Row AROM;15 reps   Protraction AAROM;5 reps   Horizontal ABduction AAROM;5 reps   External Rotation  AAROM;5 reps   Internal Rotation AAROM;5 reps   Flexion AAROM;5 reps   Abduction AAROM;5 reps   Shoulder Exercises: ROM/Strengthening   Thumb Tacks 1'   Manual Therapy   Manual Therapy Myofascial release   Manual therapy comments Manual therapy completed prior to exercises   Myofascial Release Myofascial release to right upper arm, trapezius, and scapularis regions to decrease pain and fascial restrictions and increase joint range of motion.                OT Education - 01/11/15 1445    Education provided Yes   Education Details AA/ROM; instructed pt to complete in supine   Person(s) Educated Patient   Methods Explanation;Demonstration;Handout   Comprehension Verbalized understanding;Returned demonstration          OT Short Term Goals - 12/23/14 1520    OT SHORT TERM GOAL #1   Title Pt will be educated on and independent in [place].    Time 4   Period Weeks   Status On-going   OT SHORT TERM GOAL #2   Title Pt will decrease fascial restrictions from max to moderate amounts to increase ability to obtain maximum passive stretch.    Time 4   Period Weeks   Status On-going   OT SHORT TERM GOAL #3   Title Pt will decrease pain to 4/10 during daily tasks to increase ability to perform ADL tasks with greater independence.    Time 4   Period Weeks   Status On-going   OT SHORT TERM GOAL #4   Title Pt will increase A/ROM to [facility] to increase ability to perform grooming tasks such as washing her hair and performing oral hygiene.    Time 4   Period Weeks   Status On-going   OT SHORT TERM GOAL #5   Title Pt will increase strength to 3/5 to increase her ability to donn shirts independently.    Time 4   Period Weeks   Status On-going           OT Long Term Goals - 12/23/14 1521    OT LONG TERM GOAL #1   Title Pt will return to prior level of functioning and independence using right arm as dominant during daily tasks.    Time 8   Period Weeks   Status On-going   OT  LONG TERM GOAL #2   Title Pt will decrease fascial restrictions from mod to min amounts or less to increase ROM during passive stretching.    Time 8   Period Weeks   Status On-going   OT LONG TERM GOAL #3   Title Pt will decrease pain to 2/10 or less to increase ability to perform daily tasks.    Time 8  Period Weeks   Status On-going   OT LONG TERM GOAL #4   Title Pt will increase RUE A/ROM to WNL to increase ability to reach into overhead cabinets.    Time 8   Period Weeks   Status On-going   OT LONG TERM GOAL #5   Title Pt will increase RUE strength to 4/5 to increase her ability to use her RUE as dominant during daily tasks.    Time 8   Period Weeks   Status On-going               Plan - 01/11/15 1446    Clinical Impression Statement A: Added AA/ROM in sitting this session, rest breaks provided as needed. Pt required verbal cuing for form and technique during exercises. Pt reports she attempted the AA/ROM exercises at home, provided pt with HEP this session. Did not complete mini-reassessment this date as pt has only had 4 visits including today.    Plan P: Follow up on AA/ROM HEP. Continue working towards independence in completion of exercises.         Problem List Patient Active Problem List   Diagnosis Date Noted  . 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 Tachycardia 12/02/2010  . Transaminitis 12/01/2010  . COPD with exacerbation ([place]) 11/29/2010  . Hyponatremia 11/29/2010  . Back pain, chronic 11/29/2010  . Hypertension 11/29/2010  . Oral candidiasis 11/29/2010    [name], OTR/L  [phone]  01/11/2015, 2:49 PM  [facility] [address] [place], [place], [address] Phone: [phone]   Fax:  [phone]  Name: [name] MRN: [id number] Date of Birth: Feb 09, 1945

## 2015-01-11 NOTE — Patient Instructions (Signed)

## 2015-01-12 ENCOUNTER — Telehealth (HOSPITAL_COMMUNITY): Payer: Self-pay | Admitting: Occupational Therapy

## 2015-01-12 NOTE — Telephone Encounter (Signed)
She will not be able to come in tomorrow. NF 12/82016

## 2015-01-13 ENCOUNTER — Encounter (HOSPITAL_COMMUNITY): Payer: Medicare Other | Admitting: Occupational Therapy

## 2015-01-18 ENCOUNTER — Encounter (HOSPITAL_COMMUNITY): Payer: Medicare Other | Admitting: Occupational Therapy

## 2015-01-18 ENCOUNTER — Telehealth (HOSPITAL_COMMUNITY): Payer: Self-pay | Admitting: Occupational Therapy

## 2015-01-18 NOTE — Telephone Encounter (Signed)
Called [name] about missed appointment at 1:00 on 01/18/15, she reports she is having difficulty breathing today due to COPD and could not make it. Apologized for forgetting to call, also requested to cancel Friday's appointment. Reminded pt her next appointment will be on Wed 12/21 at 1:00.   [name], OTR/L [phone] 01/18/2015

## 2015-01-20 ENCOUNTER — Encounter (HOSPITAL_COMMUNITY): Payer: Medicare Other | Admitting: Occupational Therapy

## 2015-01-25 ENCOUNTER — Ambulatory Visit (HOSPITAL_COMMUNITY): Payer: Medicare Other | Admitting: Occupational Therapy

## 2015-01-27 ENCOUNTER — Encounter (HOSPITAL_COMMUNITY): Payer: Medicare Other | Admitting: Occupational Therapy

## 2015-01-31 ENCOUNTER — Telehealth (HOSPITAL_COMMUNITY): Payer: Self-pay | Admitting: Occupational Therapy

## 2015-01-31 NOTE — Telephone Encounter (Signed)
She is very sick and can not come to this apptment. NF 01/31/2015

## 2015-02-01 ENCOUNTER — Ambulatory Visit (HOSPITAL_COMMUNITY): Payer: Medicare Other | Admitting: Occupational Therapy

## 2015-02-03 ENCOUNTER — Ambulatory Visit (HOSPITAL_COMMUNITY): Payer: Medicare Other | Admitting: Occupational Therapy

## 2015-02-07 ENCOUNTER — Telehealth (HOSPITAL_COMMUNITY): Payer: Self-pay | Admitting: Occupational Therapy

## 2015-02-07 ENCOUNTER — Ambulatory Visit (HOSPITAL_COMMUNITY): Payer: Medicare Other | Attending: Orthopedic Surgery | Admitting: Occupational Therapy

## 2015-02-07 NOTE — Telephone Encounter (Signed)
Called [name] about missed appt at 1:45 today, spoke with her husband who reports he thought she canceled today's appt. Asked for them to call when she is ready to make additional appts as she has no more scheduled at this time.    [name], OTR/L  [phone] 02/06/2014

## 2015-05-05 ENCOUNTER — Encounter (HOSPITAL_COMMUNITY): Payer: Self-pay

## 2015-05-05 NOTE — Therapy (Unsigned)
[place], [place], [address] Phone: [phone]   Fax:  [phone]  Patient Details  Name: DEMECIA NORTHWAY MRN: [id number] Date of Birth: September 12, 1945 Referring Provider:  No ref. provider found  Encounter Date: 05/05/2015 OCCUPATIONAL THERAPY DISCHARGE SUMMARY  Visits from Start of Care: 4  Current functional level related to goals / functional outcomes: OT LONG TERM GOAL #1    Title Pt will return to prior level of functioning and independence using right arm as dominant during daily tasks.    Time 8   Period Weeks   Status On-going   OT LONG TERM GOAL #2   Title Pt will decrease fascial restrictions from mod to min amounts or less to increase ROM during passive stretching.    Time 8   Period Weeks   Status On-going   OT LONG TERM GOAL #3   Title Pt will decrease pain to 2/10 or less to increase ability to perform daily tasks.    Time 8   Period Weeks   Status On-going   OT LONG TERM GOAL #4   Title Pt will increase RUE A/ROM to WNL to increase ability to reach into overhead cabinets.    Time 8   Period Weeks   Status On-going   OT LONG TERM GOAL #5   Title Pt will increase RUE strength to 4/5 to increase her ability to use her RUE as dominant during daily tasks.    Time 8   Period Weeks   Status On-going         Pt did not return since visit on 01/11/15. Will be discharge due to lack of attendance.   Plan: Patient agrees to discharge.  Patient goals were not met. Patient is being discharged due to not returning since the last visit.  ?????       Ailene Ravel, OTR/L,CBIS  [phone]  05/05/2015, 3:08 PM  [facility] [address] [place], [place], [address] Phone: [phone]   Fax:  [phone]

## 2015-05-25 ENCOUNTER — Telehealth: Payer: Self-pay | Admitting: Orthopedic Surgery

## 2015-05-25 NOTE — Telephone Encounter (Signed)
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  If any questions, states please feel free to call his cell ph# [phone].

## 2015-06-09 ENCOUNTER — Inpatient Hospital Stay (HOSPITAL_COMMUNITY): Payer: Medicare Other

## 2015-06-09 ENCOUNTER — Inpatient Hospital Stay (HOSPITAL_COMMUNITY)
Admission: EM | Admit: 2015-06-09 | Discharge: 2015-06-16 | DRG: 177 | Disposition: A | Discharge status: Skilled Nursing Facility | Payer: Medicare Other | Attending: Family Medicine | Admitting: Family Medicine

## 2015-06-09 ENCOUNTER — Encounter (HOSPITAL_COMMUNITY): Payer: Self-pay

## 2015-06-09 ENCOUNTER — Emergency Department (HOSPITAL_COMMUNITY): Payer: Medicare Other

## 2015-06-09 DIAGNOSIS — J690 Pneumonitis due to inhalation of food and vomit: Principal | ICD-10-CM | POA: Diagnosis present

## 2015-06-09 DIAGNOSIS — J9622 Acute and chronic respiratory failure with hypercapnia: Secondary | ICD-10-CM | POA: Diagnosis present

## 2015-06-09 DIAGNOSIS — Z79891 Long term (current) use of opiate analgesic: Secondary | ICD-10-CM | POA: Diagnosis not present

## 2015-06-09 DIAGNOSIS — E119 Type 2 diabetes mellitus without complications: Secondary | ICD-10-CM | POA: Diagnosis present

## 2015-06-09 DIAGNOSIS — J440 Chronic obstructive pulmonary disease with acute lower respiratory infection: Secondary | ICD-10-CM | POA: Diagnosis present

## 2015-06-09 DIAGNOSIS — G4700 Insomnia, unspecified: Secondary | ICD-10-CM | POA: Diagnosis present

## 2015-06-09 DIAGNOSIS — M79621 Pain in right upper arm: Secondary | ICD-10-CM | POA: Diagnosis present

## 2015-06-09 DIAGNOSIS — J45901 Unspecified asthma with (acute) exacerbation: Secondary | ICD-10-CM | POA: Diagnosis not present

## 2015-06-09 DIAGNOSIS — I493 Ventricular premature depolarization: Secondary | ICD-10-CM | POA: Diagnosis present

## 2015-06-09 DIAGNOSIS — F419 Anxiety disorder, unspecified: Secondary | ICD-10-CM | POA: Diagnosis present

## 2015-06-09 DIAGNOSIS — J9621 Acute and chronic respiratory failure with hypoxia: Secondary | ICD-10-CM | POA: Diagnosis present

## 2015-06-09 DIAGNOSIS — Z7984 Long term (current) use of oral hypoglycemic drugs: Secondary | ICD-10-CM | POA: Diagnosis not present

## 2015-06-09 DIAGNOSIS — R0602 Shortness of breath: Secondary | ICD-10-CM

## 2015-06-09 DIAGNOSIS — K219 Gastro-esophageal reflux disease without esophagitis: Secondary | ICD-10-CM | POA: Diagnosis present

## 2015-06-09 DIAGNOSIS — D6489 Other specified anemias: Secondary | ICD-10-CM | POA: Diagnosis present

## 2015-06-09 DIAGNOSIS — R1313 Dysphagia, pharyngeal phase: Secondary | ICD-10-CM | POA: Diagnosis present

## 2015-06-09 DIAGNOSIS — Z833 Family history of diabetes mellitus: Secondary | ICD-10-CM

## 2015-06-09 DIAGNOSIS — E669 Obesity, unspecified: Secondary | ICD-10-CM | POA: Diagnosis present

## 2015-06-09 DIAGNOSIS — G8929 Other chronic pain: Secondary | ICD-10-CM | POA: Diagnosis present

## 2015-06-09 DIAGNOSIS — R0600 Dyspnea, unspecified: Secondary | ICD-10-CM | POA: Diagnosis not present

## 2015-06-09 DIAGNOSIS — S42201S Unspecified fracture of upper end of right humerus, sequela: Secondary | ICD-10-CM | POA: Diagnosis not present

## 2015-06-09 DIAGNOSIS — M1990 Unspecified osteoarthritis, unspecified site: Secondary | ICD-10-CM | POA: Diagnosis present

## 2015-06-09 DIAGNOSIS — Z801 Family history of malignant neoplasm of trachea, bronchus and lung: Secondary | ICD-10-CM | POA: Diagnosis not present

## 2015-06-09 DIAGNOSIS — Z66 Do not resuscitate: Secondary | ICD-10-CM | POA: Diagnosis present

## 2015-06-09 DIAGNOSIS — J189 Pneumonia, unspecified organism: Secondary | ICD-10-CM

## 2015-06-09 DIAGNOSIS — Z9981 Dependence on supplemental oxygen: Secondary | ICD-10-CM | POA: Diagnosis not present

## 2015-06-09 DIAGNOSIS — Z87891 Personal history of nicotine dependence: Secondary | ICD-10-CM

## 2015-06-09 DIAGNOSIS — I10 Essential (primary) hypertension: Secondary | ICD-10-CM | POA: Diagnosis present

## 2015-06-09 DIAGNOSIS — J441 Chronic obstructive pulmonary disease with (acute) exacerbation: Secondary | ICD-10-CM | POA: Diagnosis present

## 2015-06-09 DIAGNOSIS — M549 Dorsalgia, unspecified: Secondary | ICD-10-CM | POA: Diagnosis present

## 2015-06-09 DIAGNOSIS — Z6827 Body mass index (BMI) 27.0-27.9, adult: Secondary | ICD-10-CM

## 2015-06-09 DIAGNOSIS — J181 Lobar pneumonia, unspecified organism: Secondary | ICD-10-CM

## 2015-06-09 DIAGNOSIS — F411 Generalized anxiety disorder: Secondary | ICD-10-CM | POA: Diagnosis not present

## 2015-06-09 DIAGNOSIS — F329 Major depressive disorder, single episode, unspecified: Secondary | ICD-10-CM | POA: Diagnosis present

## 2015-06-09 DIAGNOSIS — Z8701 Personal history of pneumonia (recurrent): Secondary | ICD-10-CM

## 2015-06-09 LAB — I-STAT ARTERIAL BLOOD GAS, ED
ACID-BASE EXCESS: 12 mmol/L — AB (ref 0.0–2.0)
Bicarbonate: 41.1 mEq/L — ABNORMAL HIGH (ref 20.0–24.0)
O2 SAT: 99 %
PO2 ART: 138 mmHg — AB (ref 80.0–100.0)
TCO2: 44 mmol/L (ref 0–100)
pCO2 arterial: 81.8 mmHg (ref 35.0–45.0)
pH, Arterial: 7.309 — ABNORMAL LOW (ref 7.350–7.450)

## 2015-06-09 LAB — BASIC METABOLIC PANEL
Anion gap: 10 (ref 5–15)
BUN: 8 mg/dL (ref 6–20)
CHLORIDE: 94 mmol/L — AB (ref 101–111)
CO2: 33 mmol/L — AB (ref 22–32)
Calcium: 8.4 mg/dL — ABNORMAL LOW (ref 8.9–10.3)
Creatinine, Ser: 0.74 mg/dL (ref 0.44–1.00)
GFR calc Af Amer: >60 mL/min (ref >60)
GFR calc non Af Amer: >60 mL/min (ref >60)
Glucose, Bld: 157 mg/dL — ABNORMAL HIGH (ref 65–99)
POTASSIUM: 4.8 mmol/L (ref 3.5–5.1)
SODIUM: 137 mmol/L (ref 135–145)

## 2015-06-09 LAB — CBC
HEMATOCRIT: 29.4 % — AB (ref 36.0–46.0)
HEMOGLOBIN: 8.8 g/dL — AB (ref 12.0–15.0)
MCH: 26.3 pg (ref 26.0–34.0)
MCHC: 29.9 g/dL — ABNORMAL LOW (ref 30.0–36.0)
MCV: 88 fL (ref 78.0–100.0)
PLATELETS: 537 10*3/uL — AB (ref 150–400)
RBC: 3.34 MIL/uL — AB (ref 3.87–5.11)
RDW: 16.1 % — ABNORMAL HIGH (ref 11.5–15.5)
WBC: 12.4 10*3/uL — AB (ref 4.0–10.5)

## 2015-06-09 LAB — I-STAT CG4 LACTIC ACID, ED
LACTIC ACID, VENOUS: 0.6 mmol/L (ref 0.5–2.0)
LACTIC ACID, VENOUS: 0.61 mmol/L (ref 0.5–2.0)

## 2015-06-09 LAB — CBC WITH DIFFERENTIAL/PLATELET
Basophils Absolute: 0 10*3/uL (ref 0.0–0.1)
Basophils Relative: 0 %
EOS ABS: 0.1 10*3/uL (ref 0.0–0.7)
Eosinophils Relative: 1 %
HEMATOCRIT: 29.6 % — AB (ref 36.0–46.0)
HEMOGLOBIN: 8.9 g/dL — AB (ref 12.0–15.0)
LYMPHS ABS: 0.8 10*3/uL (ref 0.7–4.0)
Lymphocytes Relative: 6 %
MCH: 27.4 pg (ref 26.0–34.0)
MCHC: 30.1 g/dL (ref 30.0–36.0)
MCV: 91.1 fL (ref 78.0–100.0)
MONOS PCT: 6 %
Monocytes Absolute: 0.7 10*3/uL (ref 0.1–1.0)
NEUTROS PCT: 87 %
Neutro Abs: 11.2 10*3/uL — ABNORMAL HIGH (ref 1.7–7.7)
Platelets: 515 10*3/uL — ABNORMAL HIGH (ref 150–400)
RBC: 3.25 MIL/uL — AB (ref 3.87–5.11)
RDW: 16.2 % — ABNORMAL HIGH (ref 11.5–15.5)
WBC: 12.9 10*3/uL — AB (ref 4.0–10.5)

## 2015-06-09 LAB — URINE MICROSCOPIC-ADD ON

## 2015-06-09 LAB — URINALYSIS, ROUTINE W REFLEX MICROSCOPIC
Bilirubin Urine: NEGATIVE
GLUCOSE, UA: NEGATIVE mg/dL
Hgb urine dipstick: NEGATIVE
Ketones, ur: NEGATIVE mg/dL
Nitrite: NEGATIVE
PROTEIN: NEGATIVE mg/dL
SPECIFIC GRAVITY, URINE: 1.012 (ref 1.005–1.030)
pH: 6 (ref 5.0–8.0)

## 2015-06-09 LAB — GLUCOSE, CAPILLARY
Glucose-Capillary: 197 mg/dL — ABNORMAL HIGH (ref 65–99)
Glucose-Capillary: 209 mg/dL — ABNORMAL HIGH (ref 65–99)

## 2015-06-09 LAB — CREATININE, SERUM
CREATININE: 0.74 mg/dL (ref 0.44–1.00)
GFR calc Af Amer: >60 mL/min (ref >60)

## 2015-06-09 LAB — TSH: TSH: 0.228 u[IU]/mL — ABNORMAL LOW (ref 0.350–4.500)

## 2015-06-09 LAB — PHOSPHORUS: PHOSPHORUS: 3 mg/dL (ref 2.5–4.6)

## 2015-06-09 LAB — MRSA PCR SCREENING: MRSA by PCR: NEGATIVE

## 2015-06-09 LAB — BRAIN NATRIURETIC PEPTIDE: B Natriuretic Peptide: 290.2 pg/mL — ABNORMAL HIGH (ref 0.0–100.0)

## 2015-06-09 LAB — PROTIME-INR
INR: 1.19 (ref 0.00–1.49)
Prothrombin Time: 15.3 seconds — ABNORMAL HIGH (ref 11.6–15.2)

## 2015-06-09 LAB — MAGNESIUM: MAGNESIUM: 1.9 mg/dL (ref 1.7–2.4)

## 2015-06-09 LAB — TROPONIN I: TROPONIN I: 0.03 ng/mL (ref <0.031)

## 2015-06-09 MED ORDER — LISINOPRIL 40 MG PO TABS
40.0000 mg | ORAL_TABLET | Freq: Every day | ORAL | Status: DC
  Administered 2015-06-09 – 2015-06-16 (×8): 40 mg via ORAL
  Filled 2015-06-09 (×8): qty 1

## 2015-06-09 MED ORDER — ALBUTEROL (5 MG/ML) CONTINUOUS INHALATION SOLN
10.0000 mg/h | INHALATION_SOLUTION | Freq: Once | RESPIRATORY_TRACT | Status: AC
  Administered 2015-06-09: 10 mg/h via RESPIRATORY_TRACT
  Filled 2015-06-09: qty 20

## 2015-06-09 MED ORDER — ATORVASTATIN CALCIUM 10 MG PO TABS
10.0000 mg | ORAL_TABLET | Freq: Every day | ORAL | Status: DC
  Administered 2015-06-09 – 2015-06-15 (×7): 10 mg via ORAL
  Filled 2015-06-09 (×8): qty 1

## 2015-06-09 MED ORDER — INSULIN ASPART 100 UNIT/ML ~~LOC~~ SOLN
0.0000 [IU] | Freq: Three times a day (TID) | SUBCUTANEOUS | Status: DC
  Administered 2015-06-10: 1 [IU] via SUBCUTANEOUS
  Administered 2015-06-10: 5 [IU] via SUBCUTANEOUS
  Administered 2015-06-10: 2 [IU] via SUBCUTANEOUS
  Administered 2015-06-11 (×2): 5 [IU] via SUBCUTANEOUS
  Administered 2015-06-11 – 2015-06-12 (×2): 2 [IU] via SUBCUTANEOUS
  Administered 2015-06-12 (×2): 7 [IU] via SUBCUTANEOUS
  Administered 2015-06-13: 2 [IU] via SUBCUTANEOUS
  Administered 2015-06-13: 7 [IU] via SUBCUTANEOUS
  Administered 2015-06-13: 3 [IU] via SUBCUTANEOUS
  Administered 2015-06-14: 9 [IU] via SUBCUTANEOUS
  Administered 2015-06-14: 2 [IU] via SUBCUTANEOUS
  Administered 2015-06-14: 5 [IU] via SUBCUTANEOUS
  Administered 2015-06-15: 3 [IU] via SUBCUTANEOUS
  Administered 2015-06-15: 5 [IU] via SUBCUTANEOUS
  Administered 2015-06-15: 2 [IU] via SUBCUTANEOUS
  Administered 2015-06-16: 3 [IU] via SUBCUTANEOUS
  Administered 2015-06-16: 5 [IU] via SUBCUTANEOUS

## 2015-06-09 MED ORDER — FUROSEMIDE 20 MG PO TABS
20.0000 mg | ORAL_TABLET | Freq: Every day | ORAL | Status: DC
  Administered 2015-06-09 – 2015-06-16 (×8): 20 mg via ORAL
  Filled 2015-06-09 (×8): qty 1

## 2015-06-09 MED ORDER — TEMAZEPAM 7.5 MG PO CAPS: 7.5000 mg | ORAL_CAPSULE | Freq: Every evening | ORAL | Status: DC | PRN

## 2015-06-09 MED ORDER — FLUOXETINE HCL 20 MG PO CAPS
60.0000 mg | ORAL_CAPSULE | Freq: Every day | ORAL | Status: DC
  Administered 2015-06-09 – 2015-06-16 (×8): 60 mg via ORAL
  Filled 2015-06-09 (×6): qty 3
  Filled 2015-06-09: qty 6
  Filled 2015-06-09: qty 3

## 2015-06-09 MED ORDER — DEXTROSE 5 % IV SOLN
2.0000 g | Freq: Once | INTRAVENOUS | Status: AC
  Administered 2015-06-09: 2 g via INTRAVENOUS
  Filled 2015-06-09: qty 2

## 2015-06-09 MED ORDER — VANCOMYCIN HCL IN DEXTROSE 1-5 GM/200ML-% IV SOLN
1000.0000 mg | Freq: Two times a day (BID) | INTRAVENOUS | Status: DC
  Administered 2015-06-09 – 2015-06-12 (×6): 1000 mg via INTRAVENOUS
  Filled 2015-06-09 (×7): qty 200

## 2015-06-09 MED ORDER — LACTATED RINGERS IV SOLN: INTRAVENOUS | Status: DC

## 2015-06-09 MED ORDER — ACETAMINOPHEN 650 MG RE SUPP: 650.0000 mg | Freq: Four times a day (QID) | RECTAL | Status: DC | PRN

## 2015-06-09 MED ORDER — DIAZEPAM 5 MG PO TABS
5.0000 mg | ORAL_TABLET | Freq: Two times a day (BID) | ORAL | Status: DC | PRN
  Administered 2015-06-10: 5 mg via ORAL
  Filled 2015-06-09: qty 1

## 2015-06-09 MED ORDER — SODIUM CHLORIDE 0.9% FLUSH
3.0000 mL | Freq: Two times a day (BID) | INTRAVENOUS | Status: DC
  Administered 2015-06-09 – 2015-06-16 (×8): 3 mL via INTRAVENOUS

## 2015-06-09 MED ORDER — DEXTROSE-NACL 5-0.45 % IV SOLN: INTRAVENOUS | Status: DC

## 2015-06-09 MED ORDER — POLYETHYLENE GLYCOL 3350 17 G PO PACK
17.0000 g | PACK | Freq: Every day | ORAL | Status: DC | PRN
  Administered 2015-06-11: 17 g via ORAL
  Filled 2015-06-09: qty 1

## 2015-06-09 MED ORDER — PIPERACILLIN-TAZOBACTAM 3.375 G IVPB
3.3750 g | Freq: Three times a day (TID) | INTRAVENOUS | Status: DC
Start: 2015-06-09 — End: 2015-06-12
  Administered 2015-06-09 – 2015-06-12 (×8): 3.375 g via INTRAVENOUS
  Filled 2015-06-09 (×10): qty 50

## 2015-06-09 MED ORDER — MORPHINE SULFATE (PF) 2 MG/ML IV SOLN
1.0000 mg | Freq: Once | INTRAVENOUS | Status: AC
  Administered 2015-06-09: 1 mg via INTRAVENOUS
  Filled 2015-06-09: qty 1

## 2015-06-09 MED ORDER — ACETAMINOPHEN 325 MG PO TABS: 650.0000 mg | ORAL_TABLET | Freq: Four times a day (QID) | ORAL | Status: DC | PRN

## 2015-06-09 MED ORDER — IPRATROPIUM BROMIDE 0.02 % IN SOLN
0.5000 mg | Freq: Once | RESPIRATORY_TRACT | Status: AC
  Administered 2015-06-09: 0.5 mg via RESPIRATORY_TRACT
  Filled 2015-06-09: qty 2.5

## 2015-06-09 MED ORDER — VANCOMYCIN HCL IN DEXTROSE 1-5 GM/200ML-% IV SOLN
1000.0000 mg | Freq: Once | INTRAVENOUS | Status: AC
  Administered 2015-06-09: 1000 mg via INTRAVENOUS
  Filled 2015-06-09: qty 200

## 2015-06-09 MED ORDER — METHYLPREDNISOLONE SODIUM SUCC 125 MG IJ SOLR
80.0000 mg | Freq: Every day | INTRAMUSCULAR | Status: DC
  Administered 2015-06-10 – 2015-06-12 (×3): 80 mg via INTRAVENOUS
  Filled 2015-06-09 (×3): qty 2

## 2015-06-09 MED ORDER — ENOXAPARIN SODIUM 40 MG/0.4ML ~~LOC~~ SOLN
40.0000 mg | SUBCUTANEOUS | Status: DC
  Administered 2015-06-09 – 2015-06-15 (×7): 40 mg via SUBCUTANEOUS
  Filled 2015-06-09 (×7): qty 0.4

## 2015-06-09 MED ORDER — MOMETASONE FURO-FORMOTEROL FUM 200-5 MCG/ACT IN AERO
2.0000 | INHALATION_SPRAY | Freq: Two times a day (BID) | RESPIRATORY_TRACT | Status: DC
  Administered 2015-06-10 – 2015-06-16 (×13): 2 via RESPIRATORY_TRACT
  Filled 2015-06-09 (×2): qty 8.8

## 2015-06-09 MED ORDER — IPRATROPIUM-ALBUTEROL 0.5-2.5 (3) MG/3ML IN SOLN
3.0000 mL | RESPIRATORY_TRACT | Status: DC
  Administered 2015-06-09 – 2015-06-14 (×27): 3 mL via RESPIRATORY_TRACT
  Filled 2015-06-09 (×28): qty 3

## 2015-06-09 MED ORDER — DILTIAZEM HCL 90 MG PO TABS
45.0000 mg | ORAL_TABLET | Freq: Four times a day (QID) | ORAL | Status: DC
  Administered 2015-06-09 – 2015-06-16 (×27): 45 mg via ORAL
  Filled 2015-06-09 (×3): qty 2
  Filled 2015-06-09: qty 0.5
  Filled 2015-06-09: qty 2
  Filled 2015-06-09 (×4): qty 0.5
  Filled 2015-06-09: qty 2
  Filled 2015-06-09 (×3): qty 0.5
  Filled 2015-06-09 (×2): qty 2
  Filled 2015-06-09 (×2): qty 0.5
  Filled 2015-06-09: qty 2
  Filled 2015-06-09: qty 0.5
  Filled 2015-06-09 (×2): qty 2
  Filled 2015-06-09 (×3): qty 0.5
  Filled 2015-06-09: qty 2
  Filled 2015-06-09: qty 0.5
  Filled 2015-06-09 (×2): qty 2
  Filled 2015-06-09: qty 0.5
  Filled 2015-06-09 (×3): qty 2
  Filled 2015-06-09: qty 0.5
  Filled 2015-06-09 (×2): qty 2
  Filled 2015-06-09 (×2): qty 0.5
  Filled 2015-06-09 (×3): qty 2
  Filled 2015-06-09 (×3): qty 0.5
  Filled 2015-06-09: qty 2
  Filled 2015-06-09: qty 0.5
  Filled 2015-06-09: qty 2
  Filled 2015-06-09: qty 0.5
  Filled 2015-06-09: qty 2
  Filled 2015-06-09 (×4): qty 0.5
  Filled 2015-06-09 (×2): qty 2
  Filled 2015-06-09 (×2): qty 0.5
  Filled 2015-06-09: qty 2

## 2015-06-09 MED ORDER — DEXTROSE 5 % IV SOLN
1.0000 g | Freq: Three times a day (TID) | INTRAVENOUS | Status: DC
  Filled 2015-06-09 (×2): qty 1

## 2015-06-09 MED ORDER — LORATADINE 10 MG PO TABS: 10.0000 mg | ORAL_TABLET | Freq: Every day | ORAL | Status: DC

## 2015-06-09 MED ORDER — INSULIN ASPART 100 UNIT/ML ~~LOC~~ SOLN
0.0000 [IU] | Freq: Every day | SUBCUTANEOUS | Status: DC
  Administered 2015-06-09: 2 [IU] via SUBCUTANEOUS
  Administered 2015-06-11 – 2015-06-13 (×3): 3 [IU] via SUBCUTANEOUS

## 2015-06-09 MED ORDER — METHYLPREDNISOLONE SODIUM SUCC 125 MG IJ SOLR
125.0000 mg | Freq: Once | INTRAMUSCULAR | Status: AC
  Administered 2015-06-09: 125 mg via INTRAVENOUS
  Filled 2015-06-09: qty 2

## 2015-06-09 NOTE — ED Notes (Signed)
Pt taken from bedpan for approx 150 cc urine and moderate amoun t of stool. Pt cleaned and repositioned.

## 2015-06-09 NOTE — ED Notes (Signed)
Code Sepsis paged out @ 0930

## 2015-06-09 NOTE — Progress Notes (Addendum)
Pharmacy Antibiotic Note  [name] is a 70 y.o. female admitted on 06/09/2015 with pneumonia.  Pharmacy has been consulted for cefepime and vancomycin dosing.  Plan: Vancomycin 1000 IV every 12 hours.  Goal trough 15-20 mcg/mL. Cefepime 2 g x 1 then 1 g q8h  Height: 5\' 4"  (162.6 cm) Weight: 160 lb (72.576 kg) IBW/kg (Calculated) : 54.7  Temp (24hrs), Avg:99.3 F (37.4 C), Min:99.3 F (37.4 C), Max:99.3 F (37.4 C)   Recent Labs Lab 06/09/15 0921 06/09/15 0934 06/09/15 1328  WBC 12.9*  --   --   CREATININE 0.74  --   --   LATICACIDVEN  --  0.60 0.61    Estimated Creatinine Clearance: 63.9 mL/min (by C-G formula based on Cr of 0.74).    Allergies  Allergen Reactions  . Aspirin Other (See Comments)    History of gastric ulcers  . Nsaids     Hx of gastric ulcers    Antimicrobials this admission: Cefepime 5/5>> Vancomycin 5/5>>  Dose adjustments this admission: N/A  Microbiology results: 5/5 Blood x 2 5/5 Urine  Levester Fresh, PharmD, BCPS, [facility] Clinical Pharmacist Pager [phone] 06/09/2015 4:29 PM   ==================   Addendum: - switch from cefepime to Zosyn   Plan: - Zosyn 3.375gm IV Q8H, 4 hr infusion - Monitor renal fxn, clinical progress   Jessalyn Hinojosa D. Mina Marble, PharmD, BCPS Pager:  [phone] 06/09/2015, 6:02 PM

## 2015-06-09 NOTE — ED Notes (Addendum)
Pt taken off Bipap and taking PO fluids and tolerating well.

## 2015-06-09 NOTE — ED Notes (Signed)
MD at bedside. 

## 2015-06-09 NOTE — H&P (Signed)
[facility] Admission History and Physical Service Pager: [phone]  Patient name: [name] Medical record number: [id number] Date of birth: May 29, 1945 Age: 70 y.o. Gender: female  Primary Care Provider: Glenda Chroman, MD Consultants: none Code Status: DNR. Discussed on admission  Chief Complaint: Shortness of breath  Assessment and Plan: [name] is a 70 y.o. female presenting with shortness of breath and cough. PMH is significant for COPD, HCAP, HTN,   Dyspnea/Respiratory failure: likely COPD exacerbation. She has dry cough. Lung exam with diminished air sounds but no wheeze or crackles. ABG suggestive for chronic COPD with CO2 retention. Patient also with recent hospitalization for pneumonia. She endorses fever although she is afebrile here. She has mild leukocytosis to 13 which could be stress induced but can't rule out HCAP. CXR with increased opacities over lower lung fields which could be pneumonia or atelectasis. No sign and symptoms of fluid overload to think of CHF. Reports nonanginal chest pain which is reproducible. EKG with PVC's but no sign of ischemia. iTrop negative. She had no cardiac history. PE unlikely with wells score of 1.5 (tachycardia). -Admit to SDU. Attending Dr. Nori Riis -s/p IV solumedrol 125 mg IV in ED. Continue by mouth or IV steroid tomorrow. -Duonebs q 4hrs + albuterol q4hrs as needed for shortness of breath  -Continue home Symbicort -Continue home Spiriva -NPO while on BIPAP -Repeat ABG  -Check BNP -oxygen as needed  -Avoids pending improvement in respiratory status  ?HAP patient with 2/4 SIRS with tachycardia and leukocytosis. Patient also with history of tachycardia. She had 3 admissions in three months for pneumonia. She is from nursing facility. She endorses fever before arrival although she is afebrile here. She has mild leukocytosis to 13 which could also be stress induced. CXR with increased opacities over lower  lung fields which could be pneumonia or atelectasis. S/p vanc and cefepime in ED -continue vancomycin pending cultures -Will switch cefepime to zosyn for broader coverage (atypicals) -F/u blood cultures.  -am CBC and BMP  DM-2: CBG . Discharged on SSI Novolog & metformin 500 mg twice a day per discharge paper from recent hospitalization. -SSI thin. Adjust as needed -continue home metformin -CBG q6h while NPO, then ACHS when she starts eating -A1c  Hypertension: normotensive here. 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 Follow up X-ray on 11/01/2015 showed impacted nondisplaced but less than 45 angulation in any fragment and less than 1 cm displacement. On Tylenol 975 mg three times a day at home -Tylenol as needed for pain - Added Morphine 1mg  q6hr prn.   Anxiety/Insomnia/?Depression:  Prozac 60 mg daily, Diazepam 5 mg twice a day prn , temazepam 15 mg at bedtime as needed.  -continue home prozac -Hold benzos for now -Watch for withdrawal from benzo if she has been taking  History of intermittent delirium: Zyprexa 10 mg at bedtime. -Zyprexa as needed  FEN/GI:  NPO while on BiPAP IVF as above  Prophylaxis: lovenox   Disposition: SDU while on BiPAP   History of Present Illness:  [name] is a 70 y.o. female presenting with shortness of breath.  Patient reports having shortness of breath for about a week. She is on 3L oxygen continuous and CPAP at night. She also reports having dry cough and subjective fever.  Also endorses left-sided chest pain. No radiation, nausea, diaphoresis or orthopnea. She has history of COPD and 90 pack-year smoking. She quit about 4 years ago.  Off note, patient with recent hospitalization at [facility] for pneumonia and discharged to nursing home. Per  ED note, patient with desaturation to 68% at 4L [place] but up to 94% with NRB on her way to ED. Code sepesis called likely due to tachycardia and leukocytosis to 13.  Patient denies nausea, emesis, diarrhea or dysuria. She reports constipation. She is on opiate for back and broken arm. ED course: patient with HR to 103, BP 123/111. Saturation 98% on nonrebreather. No fever. She was taken off nonrebreather and transitioned to BiPAP. ABG 7.3/82/138/41. WBC 13, Hgb 9, Glucose 157. iTrop & lactic acid negative. UA with few bacteria, small LE and neg nitrite.  EKG with multiform PVC's. CXR with increased right basilar opacity concerning for worsening atelectasis, edema or inflammation. Mildly increased left perihilar opacity concerning for atelectasis or infiltrate  Review Of Systems: Per HPI  Otherwise the remainder of the systems were negative.  Patient Active Problem List   Diagnosis Date Noted  . COPD with acute lower respiratory infection ([place]) 06/09/2015  . 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 Tachycardia 12/02/2010  . Transaminitis 12/01/2010  . COPD with exacerbation ([place]) 11/29/2010  . Hyponatremia 11/29/2010  . Back pain, chronic 11/29/2010  . Hypertension 11/29/2010  . Oral candidiasis 11/29/2010    Past Medical History: Past Medical History  Diagnosis Date  . GERD (gastroesophageal reflux disease)   . Allergic rhinitis   . Pneumonia   . Hypertension   . Diabetes mellitus, type 2 ([place])   . 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 Duodenal ulcer   . Hiatal hernia   . Diverticulosis   . IBS (irritable bowel syndrome)   . Diverticulitis 2001  . Anxiety   . Chronic airway obstruction, not elsewhere classified   . 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 Tubulovillous adenoma of colon 1998    Past Surgical History: Past Surgical History  Procedure Laterality Date  . Cesarean section  1971   . Abdominal hysterectomy  1980  . Colectomy  07/1999    Sigmoid-chronic diverticulitis  . Rod in left leg    . Cholecystectomy      ERCP sphincterotomy, stone extraction 2006  . Endoscopic retrograde cholangiopancreatography (ercp) with propofol  11/06/2004    RMR: Normal appearing ampulla/Mildly diffusely dilated biliary tree/ status post sphincterotomy and stone extraction as  . Esophagogastroduodenoscopy  09/1999    [place]: Gastritis, mild duodenitis  . Colonoscopy  05/1999    [place]: Multiple colonic polys (benign polypoid mucosa), very tortuous sigmoid colon  . Colonoscopy  01/13/2012    Procedure: COLONOSCOPY;  Surgeon: Daneil Dolin, MD;  Location: [facility] END[facility] SU[facility];  Service: Endoscopy;  Laterality: N/A;  10:30  . Cataract extraction w/phaco Left 08/02/2013    Procedure: CATARACT EXTRACTION PHACO AND INTRAOCULAR LENS PLACEMENT (IOC);  Surgeon: Williams Che, MD;  Location: [facility] ORS;  Service: Ophthalmology;  Laterality: Left;  CDE 2.57  . Cataract extraction w/phaco Right 12/20/2013    Procedure: CATARACT EXTRACTION PHACO AND INTRAOCULAR LENS PLACEMENT; CDE: 2.56;  Surgeon: Williams Che, MD;  Location: [facility] ORS;  Service: Ophthalmology;  Laterality: Right;    Social History: Social History  Substance Use Topics  . Smoking status: Former Smoker --  0.50 packs/day    Types: Cigarettes    Quit date: 07/01/2010  . Smokeless tobacco: None     Comment: Quit x 1.5 years  . Alcohol Use: No   Additional social history:   Please also refer to relevant sections of EMR.  Family History: Family History  Problem Relation Age of Onset  . Aneurysm Mother   . Lung cancer Father   . Diabetes Sister   . Diabetes Sister    (If not completed, MUST add something in)  Allergies and Medications: Allergies  Allergen Reactions  . Aspirin Other (See Comments)    History of gastric ulcers  . Nsaids     Hx of gastric ulcers   Current Facility-Administered Medications on File Prior to  Encounter  Medication Dose Route Frequency Provider Last Rate Last Dose  . mupirocin ointment (BACTROBAN) 2 %   Nasal BID Williams Che, MD       Current Outpatient Prescriptions on File Prior to Encounter  Medication Sig Dispense Refill  . albuterol (PROVENTIL HFA;VENTOLIN HFA) 108 (90 BASE) MCG/ACT inhaler Inhale 2 puffs into the lungs every 6 (six) hours as needed. Shortness of Breath    . albuterol (PROVENTIL) (2.5 MG/3ML) 0.083% nebulizer solution Take 2.5 mg by nebulization daily as needed for wheezing or shortness of breath.    . diazepam (VALIUM) 5 MG tablet Take 5 mg by mouth 2 (two) times daily as needed for anxiety.     Marland Kitchen FLUoxetine (PROZAC) 20 MG capsule Take 60 mg by mouth daily.     . Fluticasone Furoate-Vilanterol (BREO ELLIPTA) 100-25 MCG/INH AEPB Inhale 1 puff into the lungs daily.    . insulin aspart (NOVOLOG) 100 UNIT/ML injection Inject 0-10 Units into the skin 4 (four) times daily. SLIDING SCALE    . OLANZapine (ZYPREXA) 10 MG tablet Take 10 mg by mouth at bedtime.      Marland Kitchen tiotropium (SPIRIVA) 18 MCG inhalation capsule Place 18 mcg into inhaler and inhale daily.      Marland Kitchen atorvastatin (LIPITOR) 10 MG tablet Take 10 mg by mouth daily.    . ATROVENT HFA 17 MCG/ACT inhaler Take 3 mLs by nebulization 4 (four) times daily as needed. Shortness of Breath    . cetirizine (ZYRTEC) 10 MG tablet Take 10 mg by mouth at bedtime.     . fentaNYL (DURAGESIC - DOSED MCG/HR) 25 MCG/HR patch Place 25 mcg onto the skin every 3 (three) days.    Marland Kitchen glipiZIDE (GLUCOTROL) 10 MG tablet Take 10 mg by mouth 2 (two) times daily before a meal.    . hydrochlorothiazide 25 MG tablet Take 12.5 mg by mouth at bedtime.     . insulin glargine (LANTUS) 100 UNIT/ML injection Inject into the skin at bedtime.    Marland Kitchen lisinopril (PRINIVIL,ZESTRIL) 20 MG tablet Take 20 mg by mouth at bedtime.      Marland Kitchen omeprazole (PRILOSEC) 20 MG capsule Take 20 mg by mouth daily.      Marland Kitchen oxyCODONE (ROXICODONE) 5 MG immediate release  tablet Take 1 tablet (5 mg total) by mouth every 6 (six) hours as needed for moderate pain or severe pain. (Patient not taking: Reported on 12/14/2014) 15 tablet 0  . theophylline (THEO-24) 200 MG 24 hr capsule Take 200 mg by mouth 2 (two) times daily.      Objective: BP 128/65 mmHg  Pulse 104  Temp(Src) 99.3 F (37.4 C) (Rectal)  Resp 28  Ht 5\' 4"  (1.626 m)  Wt 160 lb (72.576  kg)  BMI 27.45 kg/m2  SpO2 93% Exam: GEN: elderly, frail, with BiPAP CVS: tender to palpation over her left chest, regular rate and rythm, normal s1 and s2, no edema, good DP pulses bilaterally RESP: on BiPAP, some increaased work of breathing, diminished air sounds bilaterally, no crackles or wheeze GI: normal bowel sound, soft, non-tender,non-distended MSK: able to use her right arm to sit up in bed for exam NEURO: alert, no gross defecits  PSYCH: good affect  Labs and Imaging: CBC BMET   Recent Labs Lab 06/09/15 0921  WBC 12.9*  HGB 8.9*  HCT 29.6*  PLT 515*    Recent Labs Lab 06/09/15 0921  NA 137  K 4.8  CL 94*  CO2 33*  BUN 8  CREATININE 0.74  GLUCOSE 157*  CALCIUM 8.4*     Dg Chest 2 View  06/09/2015  CLINICAL DATA:  Shortness of breath 1 week. EXAM: CHEST  2 VIEW COMPARISON:  May 04, 2015. FINDINGS: Stable cardiomediastinal silhouette. No pneumothorax is noted. Increased right basilar opacity is noted concerning for worsening atelectasis, edema or infiltrate. Mildly increased left perihilar opacity is noted concerning for atelectasis or infiltrate. Old comminuted proximal right humeral fracture is noted. Upper thoracic vertebral body compression fracture is noted. IMPRESSION: Increased right basilar opacity concerning for worsening atelectasis, edema or inflammation. Mildly increased left perihilar opacity concerning for atelectasis or infiltrate. Electronically Signed   By: Marijo Conception, M.D.   On: 06/09/2015 09:48    Mercy Riding, MD 06/09/2015, 2:40 PM PGY-1, [place] [facility] pager: [phone], text pages welcome  I agree with the above evaluation, assessment, and plan. Any correctional changes can be noted in [facility].   Aquilla Hacker, MD Family Medicine Resident - PGY 2

## 2015-06-09 NOTE — ED Notes (Addendum)
Pt arrives EMS from [facility] with c/o respiratory distress. EMS states 68% at [place] but up to 94% with NRB.  Pt arrives responds to questions

## 2015-06-09 NOTE — ED Notes (Signed)
MD aware of lactic and states no further fluid orders.

## 2015-06-09 NOTE — ED Notes (Signed)
Pt removes mask for treatment and desats to 81% mask replaced.

## 2015-06-09 NOTE — ED Notes (Signed)
Pt uop to bedpan.

## 2015-06-09 NOTE — ED Notes (Signed)
Paged admitting to [place], [place].

## 2015-06-09 NOTE — ED Provider Notes (Addendum)
CSN: [id number]     Arrival date & time 06/09/15  [id number] History   First MD Initiated Contact with Patient 06/09/15 0901     Chief Complaint  Patient presents with  . Respiratory Distress     (Consider location/radiation/quality/duration/timing/severity/associated sxs/prior Treatment) HPI  Past Medical History  Diagnosis Date  . GERD (gastroesophageal reflux disease)   . Allergic rhinitis   . Pneumonia   . Hypertension   . Diabetes mellitus, type 2 ([place])   . 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 Duodenal ulcer   . Hiatal hernia   . Diverticulosis   . IBS (irritable bowel syndrome)   . Diverticulitis 2001  . Anxiety   . Chronic airway obstruction, not elsewhere classified   . 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 Tubulovillous adenoma of colon 1998   Past Surgical History  Procedure Laterality Date  . Cesarean section  1971  . Abdominal hysterectomy  1980  . Colectomy  07/1999    Sigmoid-chronic diverticulitis  . Rod in left leg    . Cholecystectomy      ERCP sphincterotomy, stone extraction 2006  . Endoscopic retrograde cholangiopancreatography (ercp) with propofol  11/06/2004    RMR: Normal appearing ampulla/Mildly diffusely dilated biliary tree/ status post sphincterotomy and stone extraction as  . Esophagogastroduodenoscopy  09/1999    [place]: Gastritis, mild duodenitis  . Colonoscopy  05/1999    [place]: Multiple colonic polys (benign polypoid mucosa), very tortuous sigmoid colon  . Colonoscopy  01/13/2012    Procedure: COLONOSCOPY;  Surgeon: Daneil Dolin, MD;  Location: [facility];  Service: Endoscopy;  Laterality: N/A;  10:30  . Cataract extraction w/phaco Left 08/02/2013    Procedure: CATARACT EXTRACTION PHACO AND INTRAOCULAR LENS PLACEMENT (IOC);  Surgeon: Williams Che, MD;  Location: [facility] ORS;  Service: Ophthalmology;  Laterality: Left;  CDE 2.57  . Cataract extraction  w/phaco Right 12/20/2013    Procedure: CATARACT EXTRACTION PHACO AND INTRAOCULAR LENS PLACEMENT; CDE: 2.56;  Surgeon: Williams Che, MD;  Location: [facility] ORS;  Service: Ophthalmology;  Laterality: Right;   Family History  Problem Relation Age of Onset  . Aneurysm Mother   . Lung cancer Father   . Diabetes Sister   . Diabetes Sister    Social History  Substance Use Topics  . Smoking status: Former Smoker -- 0.50 packs/day    Types: Cigarettes    Quit date: 07/01/2010  . Smokeless tobacco: None     Comment: Quit x 1.5 years  . Alcohol Use: No   OB History    No data available     Review of Systems    Allergies  Aspirin and Nsaids  Home Medications   Prior to Admission medications   Medication Sig Start Date End Date Taking? Authorizing Provider  albuterol (PROVENTIL HFA;VENTOLIN HFA) 108 (90 BASE) MCG/ACT inhaler Inhale 2 puffs into the lungs every 6 (six) hours as needed. Shortness of Breath    Historical Provider, MD  albuterol (PROVENTIL) (2.5 MG/3ML) 0.083% nebulizer solution Take 2.5 mg by nebulization daily as needed for wheezing or shortness of breath.    Historical Provider, MD  atorvastatin (LIPITOR) 10 MG tablet Take 10 mg by mouth daily.    Historical Provider, MD  ATROVENT HFA 17 MCG/ACT inhaler Take 3 mLs by nebulization 4 (four) times daily as needed. Shortness of Breath 12/20/11   Historical Provider, MD  cetirizine (ZYRTEC) 10 MG tablet Take 10 mg by mouth at bedtime.     Historical Provider, MD  diazepam (VALIUM) 5 MG tablet Take 5 mg by mouth 2 (two) times daily.    Historical Provider, MD  fentaNYL (DURAGESIC - DOSED MCG/HR) 25 MCG/HR patch Place 25 mcg onto the skin every 3 (three) days.    Historical Provider, MD  FLUoxetine (PROZAC) 20 MG capsule Take 60 mg by mouth daily.     Historical Provider, MD  Fluticasone Furoate-Vilanterol (BREO ELLIPTA) 100-25 MCG/INH AEPB Inhale 1 puff into the lungs daily.    Historical Provider, MD  glipiZIDE (GLUCOTROL) 10  MG tablet Take 10 mg by mouth 2 (two) times daily before a meal.    Historical Provider, MD  hydrochlorothiazide 25 MG tablet Take 12.5 mg by mouth at bedtime.     Historical Provider, MD  insulin aspart (NOVOLOG) 100 UNIT/ML injection Inject into the skin 3 (three) times daily before meals.    Historical Provider, MD  insulin glargine (LANTUS) 100 UNIT/ML injection Inject into the skin at bedtime.    Historical Provider, MD  lisinopril (PRINIVIL,ZESTRIL) 20 MG tablet Take 20 mg by mouth at bedtime.      Historical Provider, MD  OLANZapine (ZYPREXA) 10 MG tablet Take 10 mg by mouth at bedtime.      Historical Provider, MD  omeprazole (PRILOSEC) 20 MG capsule Take 20 mg by mouth daily.      Historical Provider, MD  oxyCODONE (ROXICODONE) 5 MG immediate release tablet Take 1 tablet (5 mg total) by mouth every 6 (six) hours as needed for moderate pain or severe pain. Patient not taking: Reported on 12/14/2014 10/11/14   Rolland Porter, MD  oxyCODONE-acetaminophen (PERCOCET/ROXICET) 5-325 MG per tablet Take 1 tablet by mouth 3 (three) times daily. Pain    Historical Provider, MD  theophylline (THEO-24) 200 MG 24 hr capsule Take 200 mg by mouth 2 (two) times daily.    Historical Provider, MD  tiotropium (SPIRIVA) 18 MCG inhalation capsule Place 18 mcg into inhaler and inhale daily.      Historical Provider, MD   BP 95/50 mmHg  Pulse 95  Temp(Src) 99.3 F (37.4 C) (Rectal)  Resp 23  Ht 5\' 4"  (1.626 m)  Wt 160 lb (72.576 kg)  BMI 27.45 kg/m2  SpO2 98% Physical Exam  ED Course  .Critical Care Performed by: Varney Biles Authorized by: Varney Biles Total critical care time: 40 minutes Critical care time was exclusive of separately billable procedures and treating other patients. Critical care was necessary to treat or prevent imminent or life-threatening deterioration of the following conditions: respiratory failure. Critical care was time spent personally by me on the following activities: blood  draw for specimens, examination of patient, evaluation of patient's response to treatment, obtaining history from patient or surrogate, ordering and performing treatments and interventions, ordering and review of laboratory studies, ordering and review of radiographic studies, pulse oximetry, re-evaluation of patient's condition and review of old charts.   (including critical care time) Labs Review Labs Reviewed  CBC WITH DIFFERENTIAL/PLATELET - Abnormal; Notable for the following:    WBC 12.9 (*)    RBC 3.25 (*)    Hemoglobin 8.9 (*)    HCT 29.6 (*)    RDW 16.2 (*)    Platelets 515 (*)    Neutro Abs 11.2 (*)    All other components within normal limits  BASIC METABOLIC PANEL - Abnormal; Notable for the following:    Chloride 94 (*)  CO2 33 (*)    Glucose, Bld 157 (*)    Calcium 8.4 (*)    All other components within normal limits  I-STAT ARTERIAL BLOOD GAS, ED - Abnormal; Notable for the following:    pH, Arterial 7.309 (*)    pCO2 arterial 81.8 (*)    pO2, Arterial 138.0 (*)    Bicarbonate 41.1 (*)    Acid-Base Excess 12.0 (*)    All other components within normal limits  CULTURE, BLOOD (ROUTINE X 2)  CULTURE, BLOOD (ROUTINE X 2)  URINE CULTURE  TROPONIN I  URINALYSIS, ROUTINE W REFLEX MICROSCOPIC (NOT AT [facility])  BLOOD GAS, ARTERIAL  I-STAT CG4 LACTIC ACID, ED    Imaging Review Dg Chest 2 View  06/09/2015  CLINICAL DATA:  Shortness of breath 1 week. EXAM: CHEST  2 VIEW COMPARISON:  May 04, 2015. FINDINGS: Stable cardiomediastinal silhouette. No pneumothorax is noted. Increased right basilar opacity is noted concerning for worsening atelectasis, edema or infiltrate. Mildly increased left perihilar opacity is noted concerning for atelectasis or infiltrate. Old comminuted proximal right humeral fracture is noted. Upper thoracic vertebral body compression fracture is noted. IMPRESSION: Increased right basilar opacity concerning for worsening atelectasis, edema or inflammation.  Mildly increased left perihilar opacity concerning for atelectasis or infiltrate. Electronically Signed   By: Marijo Conception, M.D.   On: 06/09/2015 09:48   I have personally reviewed and evaluated these images and lab results as part of my medical decision-making.   EKG Interpretation   Date/Time:  Friday Jun 09 2015 08:57:31 EDT Ventricular Rate:  94 PR Interval:  137 QRS Duration: 95 QT Interval:  349 QTC Calculation: 436 R Axis:   74 Text Interpretation:  Sinus tachycardia Multiform ventricular premature  complexes Anterior infarct, old No significant change since last tracing  Confirmed by Lavanda Nevels, MD, Thelma Comp [phone]) on 06/09/2015 10:13:13 AM      MDM  Pt comes in with cc of dib. Pt is at nursing home and has advanced COPD on constant O2. She has mild resp distress. Poor aeration, and some rales at the base. Pt is tachycardic as well.  CXR suggests possible infiltrate. There is mildly elevated WC. No fevers. Lactate is normal.  Presume COPD exacerbation acute with possible underlying infection-  But it is not a clear picture clinically at this time.   @11 :00 Repeat exam reveals clearing of wheezing and better aeration in all lung fields.  Patient is not in any respiratory distress. She is a bit somnolent. Could be meds, but also could be due to her being on NRB here- thus ABG done, and there is mild hypercapnia.  We will switch her to 4 liters [place]. She is following commands, answering all questions appropriately, and is alert. Aeration has improved - so no bipap. Pt is DNR per records.   11:30: Hypoxic on [place] down to 70 %. We will place her on bipap to avoid rapid decompensation due to hypercapnia and hypoxia.  Final diagnoses:  HCAP (healthcare-associated pneumonia)  Acute exacerbation of COPD with asthma ([place])       Varney Biles, MD 06/09/15 [place], MD 06/09/15 1156

## 2015-06-09 NOTE — ED Notes (Signed)
Pt returns from xray

## 2015-06-10 DIAGNOSIS — J181 Lobar pneumonia, unspecified organism: Secondary | ICD-10-CM

## 2015-06-10 DIAGNOSIS — D6489 Other specified anemias: Secondary | ICD-10-CM

## 2015-06-10 DIAGNOSIS — J189 Pneumonia, unspecified organism: Secondary | ICD-10-CM

## 2015-06-10 DIAGNOSIS — F411 Generalized anxiety disorder: Secondary | ICD-10-CM | POA: Insufficient documentation

## 2015-06-10 DIAGNOSIS — R0602 Shortness of breath: Secondary | ICD-10-CM

## 2015-06-10 DIAGNOSIS — M549 Dorsalgia, unspecified: Secondary | ICD-10-CM

## 2015-06-10 LAB — CBC
HEMATOCRIT: 28.3 % — AB (ref 36.0–46.0)
Hemoglobin: 8.9 g/dL — ABNORMAL LOW (ref 12.0–15.0)
MCH: 28 pg (ref 26.0–34.0)
MCHC: 31.4 g/dL (ref 30.0–36.0)
MCV: 89 fL (ref 78.0–100.0)
Platelets: 561 10*3/uL — ABNORMAL HIGH (ref 150–400)
RBC: 3.18 MIL/uL — ABNORMAL LOW (ref 3.87–5.11)
RDW: 15.7 % — AB (ref 11.5–15.5)
WBC: 10.3 10*3/uL (ref 4.0–10.5)

## 2015-06-10 LAB — COMPREHENSIVE METABOLIC PANEL
ALBUMIN: 2.2 g/dL — AB (ref 3.5–5.0)
ALT: 14 U/L (ref 14–54)
AST: 12 U/L — AB (ref 15–41)
Alkaline Phosphatase: 53 U/L (ref 38–126)
Anion gap: 9 (ref 5–15)
BUN: 11 mg/dL (ref 6–20)
CHLORIDE: 92 mmol/L — AB (ref 101–111)
CO2: 36 mmol/L — AB (ref 22–32)
Calcium: 8.6 mg/dL — ABNORMAL LOW (ref 8.9–10.3)
Creatinine, Ser: 0.8 mg/dL (ref 0.44–1.00)
GFR calc Af Amer: >60 mL/min (ref >60)
Glucose, Bld: 174 mg/dL — ABNORMAL HIGH (ref 65–99)
POTASSIUM: 4.4 mmol/L (ref 3.5–5.1)
SODIUM: 137 mmol/L (ref 135–145)
Total Bilirubin: 0.3 mg/dL (ref 0.3–1.2)
Total Protein: 6.2 g/dL — ABNORMAL LOW (ref 6.5–8.1)

## 2015-06-10 LAB — URINE CULTURE

## 2015-06-10 LAB — BLOOD GAS, ARTERIAL
ACID-BASE EXCESS: 12.2 mmol/L — AB (ref 0.0–2.0)
BICARBONATE: 37.4 meq/L — AB (ref 20.0–24.0)
Drawn by: 24610
O2 Content: 6 L/min
O2 Saturation: 93.4 %
PCO2 ART: 59.9 mmHg — AB (ref 35.0–45.0)
PO2 ART: 70.5 mmHg — AB (ref 80.0–100.0)
Patient temperature: 98.6
TCO2: 39.2 mmol/L (ref 0–100)
pH, Arterial: 7.412 (ref 7.350–7.450)

## 2015-06-10 LAB — GLUCOSE, CAPILLARY
GLUCOSE-CAPILLARY: 128 mg/dL — AB (ref 65–99)
GLUCOSE-CAPILLARY: 297 mg/dL — AB (ref 65–99)
GLUCOSE-CAPILLARY: 183 mg/dL — AB (ref 65–99)
Glucose-Capillary: 195 mg/dL — ABNORMAL HIGH (ref 65–99)

## 2015-06-10 LAB — HEMOGLOBIN A1C
HEMOGLOBIN A1C: 6.9 % — AB (ref 4.8–5.6)
MEAN PLASMA GLUCOSE: 151 mg/dL

## 2015-06-10 MED ORDER — MORPHINE SULFATE (PF) 2 MG/ML IV SOLN
1.0000 mg | Freq: Once | INTRAVENOUS | Status: AC
  Administered 2015-06-10: 1 mg via INTRAVENOUS
  Filled 2015-06-10: qty 1

## 2015-06-10 MED ORDER — OXYCODONE HCL 5 MG PO TABS: 5.0000 mg | ORAL_TABLET | Freq: Two times a day (BID) | ORAL | Status: DC | PRN

## 2015-06-10 MED ORDER — OXYCODONE HCL 5 MG PO TABS
5.0000 mg | ORAL_TABLET | Freq: Three times a day (TID) | ORAL | Status: DC | PRN
  Administered 2015-06-10 – 2015-06-11 (×3): 5 mg via ORAL
  Filled 2015-06-10 (×3): qty 1

## 2015-06-10 MED ORDER — DIAZEPAM 5 MG PO TABS
5.0000 mg | ORAL_TABLET | Freq: Two times a day (BID) | ORAL | Status: DC | PRN
  Administered 2015-06-10: 5 mg via ORAL
  Filled 2015-06-10: qty 1

## 2015-06-10 NOTE — Progress Notes (Signed)
RN called d/t pt desat after getting cleaned up.  Pt was slightly SOB and requested cpap/bipap.  Pt placed intitially on cpap, however sat was 88-90 on 10 L oxygen bled in.  Placed pt on bipap, 12/6 and 30%, sat 93-94%.  Per MD, pt sat goal is 88-92% for baseline sat. RN/MD at bedside and aware.  Pt tol bipap well, no distress noted, VSS.

## 2015-06-10 NOTE — Progress Notes (Signed)
CRITICAL VALUE ALERT  Critical value received:  PCO2  Date of notification:  06/10/15  Time of notification:  1430  Critical value read back: yes  Nurse who received alert:  Gerlene Fee  MD notified (1st page):  Family Medicine Teaching Service  Time of first page:  1431  MD notified (2nd page):  Time of second page:  Responding MD:  Family medicine teaching service  Time MD responded:  [phone]

## 2015-06-10 NOTE — Consult Note (Signed)
Name: [name] MRN: [id number] DOB: 1945/07/26    ADMISSION DATE:  06/09/2015 CONSULTATION DATE:  06/10/15  REFERRING MD : Greig Right  CHIEF COMPLAINT: short of breath  BRIEF PATIENT DESCRIPTION: 75yoF former smoker with known COPD on 3L O2 at home. No current pulmonary f/u.  Describes recurrent shortness of breath over past 2 months, seeming to respond to antibiotics/ steroids. Initially hosp at [facility], then [facility] and now here with interim rehab SNF. Being treated for presumed pneumonia w AECOPD. Required BIPAP for CO2 retention with hypoxia overnight, but says she can't tell it helped. PCCM asked to review and assist. Initial PH 7.30/pCO2 81.8/ pO2 138/  HCO3 41 CT chest shows emphysema and upper zone diffuse ground glass, very similar to 10/5/06on my review. Recent CXRs- hypoventilation, hazy infiltrates.  SIGNIFICANT EVENTS    STUDIES:  CT chest w/o 06/09/15 ABG on 6L o2 5/6- 7.42/ pO2 70.5/ pCO2 59.9/ HCO3 37.4  HISTORY OF PRESENT ILLNESS:  66yoF former smoker with known COPD on 3L O2 at home. No current pulmonary f/u. Up till 5 yrs ago followed by Dr Viona Gilmore. [facility]. Had bronch there 5 years ago for ? Daughter here, helpful with history.PCP has addressed episodic exacerbations with oral abx and prednisone, usually effective. Describes recurrent shortness of breath over past 2 months, seeming to respond to antibiotics. Initially hosp at [facility], then [facility] and now here with interim rehab SNF. Was doing well with mobilization at last SNF until again began c/o "can't breathe". Daughter suspects IV antibiotics just dc'd too soon. Exacerbations/ relapses not associated with significant fever, chills, change in sputum, blood or chest pain. No significant vomiting, edema, palpitations. Being treated for presumed pneumonia w AECOPD. Required BIPAP for CO2 retention with hypoxia overnight, but says she can't tell it helped. PCCM asked to review and assist. CT chest shows emphysema and  upper zone diffuse ground glass, very similar to 11/08/04.   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  has past surgical history that includes Cesarean section (1971); Abdominal hysterectomy (1980); Colectomy (07/1999); rod in left leg; Cholecystectomy; Endoscopic retrograde cholangiopancreatography (ercp) with propofol (11/06/2004); Esophagogastroduodenoscopy (09/1999); Colonoscopy (05/1999); Colonoscopy (01/13/2012); Cataract extraction w/PHACO (Left, 08/02/2013); and Cataract extraction w/PHACO (Right, 12/20/2013). Prior to Admission medications   Medication Sig Start Date End Date Taking? Authorizing Provider  acetaminophen (TYLENOL) 500 MG tablet Take 500 mg by mouth every 6 (six) hours as needed for mild pain.   Yes Historical Provider, MD  albuterol (PROVENTIL HFA;VENTOLIN HFA) 108 (90 BASE) MCG/ACT inhaler Inhale 2 puffs into the lungs every 6 (six) hours as needed. Shortness of Breath   Yes Historical Provider, MD  albuterol (PROVENTIL) (2.5 MG/3ML) 0.083% nebulizer solution Take 2.5 mg by nebulization daily as needed for wheezing or shortness of breath.   Yes Historical Provider, MD  budesonide-formoterol (SYMBICORT) 160-4.5 MCG/ACT inhaler Inhale 2 puffs into the lungs 2 (two) times daily.   Yes Historical Provider, MD  calcium carbonate (TUMS - DOSED IN MG ELEMENTAL CALCIUM) 500 MG chewable tablet Chew 1 tablet by mouth 2 (two) times daily.   Yes Historical Provider, MD  diazepam (VALIUM) 5 MG tablet Take 5 mg by mouth  2 (two) times daily as  needed for anxiety.    Yes Historical Provider, MD  diltiazem (CARDIZEM) 90 MG tablet Take 45 mg by mouth 4 (four) times daily.   Yes Historical Provider, MD  enoxaparin (LOVENOX) 40 MG/0.4ML injection Inject 40 mg into the skin daily.   Yes Historical Provider, MD  ergocalciferol (VITAMIN D2) 50000 units capsule Take 50,000 Units by mouth once a week.   Yes Historical Provider, MD  famotidine (PEPCID) 20 MG tablet Take 20 mg by mouth daily.   Yes Historical Provider, MD  feeding supplement, GLUCERNA SHAKE, (GLUCERNA SHAKE) LIQD Take 237 mLs by mouth 2 (two) times daily between meals.   Yes Historical Provider, MD  FLUoxetine (PROZAC) 20 MG capsule Take 60 mg by mouth daily.    Yes Historical Provider, MD  furosemide (LASIX) 20 MG tablet Take 20 mg by mouth daily.   Yes Historical Provider, MD  insulin aspart (NOVOLOG) 100 UNIT/ML injection Inject 0-10 Units into the skin 4 (four) times daily. SLIDING SCALE   Yes Historical Provider, MD  ipratropium-albuterol (DUONEB) 0.5-2.5 (3) MG/3ML SOLN Take 3 mLs by nebulization every 6 (six) hours.   Yes Historical Provider, MD  lisinopril (PRINIVIL,ZESTRIL) 40 MG tablet Take 40 mg by mouth daily.   Yes Historical Provider, MD  metFORMIN (GLUCOPHAGE) 500 MG tablet Take 500 mg by mouth 2 (two) times daily with a meal.   Yes Historical Provider, MD  OLANZapine (ZYPREXA) 10 MG tablet Take 10 mg by mouth at bedtime.     Yes Historical Provider, MD  senna-docusate (SENOKOT-S) 8.6-50 MG tablet Take 1 tablet by mouth daily.   Yes Historical Provider, MD  temazepam (RESTORIL) 15 MG capsule Take 15 mg by mouth at bedtime as needed for sleep.   Yes Historical Provider, MD  tiotropium (SPIRIVA) 18 MCG inhalation capsule Place 18 mcg into inhaler and inhale daily.     Yes Historical Provider, MD  TUBERCULIN PPD ID Inject 0.1 mLs into the skin once. 06-07-15   Yes Historical Provider, MD  atorvastatin (LIPITOR) 10 MG tablet Take 10 mg by mouth daily.    Historical Provider,  MD  cetirizine (ZYRTEC) 10 MG tablet Take 10 mg by mouth at bedtime.     Historical Provider, MD  omeprazole (PRILOSEC) 20 MG capsule Take 20 mg by mouth daily.      Historical Provider, MD   Allergies  Allergen Reactions  . Aspirin Other (See Comments)    History of gastric ulcers  . Nsaids     Hx of gastric ulcers    FAMILY HISTORY:  family history includes Aneurysm in her mother; Diabetes in her sister and sister; Lung cancer in her father. SOCIAL HISTORY:  reports that she quit smoking about 4 years ago. Her smoking use included Cigarettes. She smoked 0.50 packs per day. She does not have any smokeless tobacco history on file. She reports that she does not drink alcohol or use illicit drugs.  REVIEW OF SYSTEMS:  += pos Constitutional: Negative for fever, chills, weight loss, malaise/fatigue and diaphoresis.  HENT: Negative for hearing loss, ear pain, nosebleeds, congestion, sore throat, neck pain, tinnitus and ear discharge.   Eyes: Negative for blurred vision, double vision, photophobia, pain, discharge and redness.  Respiratory: Negative for cough, hemoptysis, sputum production, +shortness of breath, wheezing and stridor.   Cardiovascular: Negative for chest pain, palpitations, orthopnea, claudication, leg swelling and PND.  Gastrointestinal: Negative for heartburn, nausea, vomiting, abdominal pain, diarrhea, constipation, blood in stool and melena.  Genitourinary: Negative for dysuria, urgency, frequency, hematuria and flank pain.  Musculoskeletal: Negative for myalgias, back pain, joint pain and falls.  Skin: Negative for itching and rash.  Neurological: Negative for dizziness, tingling, tremors, sensory change, speech change, focal weakness, seizures, loss of consciousness, weakness and headaches.  Endo/Heme/Allergies: Negative for environmental allergies and polydipsia. Does not bruise/bleed easily.  SUBJECTIVE:   VITAL SIGNS: Temp:  [97.9 F (36.6 C)-98.6 F (37 C)] 98  F (36.7 C) (05/06 1600) Pulse Rate:  [86-103] 89 (05/06 1600) Resp:  [17-30] 23 (05/06 1600) BP: (107-151)/(51-80) 134/71 mmHg (05/06 1600) SpO2:  [91 %-99 %] 96 % (05/06 1631) FiO2 (%):  [40 %] 40 % (05/06 1151) Weight:  [161 lb (73.029 kg)] 161 lb (73.029 kg) (05/06 0400)  PHYSICAL EXAMINATION: General: Obese, passive, awake, oriented elderly woman, NAD Neuro:  Oriented, speech clear, moves extrems x 4, non-focal HEENT:  Speech clear, no stridor, no JVD, grossly nl vision and hearing Cardiovascular:  RRR, no m/g/r, no peripheral edema Lungs: few minor crackles, dry cough with deep breath, unlabored on nasal O2 Abdomen:  Obese, soft,non-tender, no HSM noted, BS present Musculoskeletal:  Weak- assisted to sit upright for exam Skin:  No rash, tatoo L ankle   Recent Labs Lab 06/09/15 0921 06/09/15 1641 06/10/15 0305  NA 137  --  137  K 4.8  --  4.4  CL 94*  --  92*  CO2 33*  --  36*  BUN 8  --  11  CREATININE 0.74 0.74 0.80  GLUCOSE 157*  --  174*    Recent Labs Lab 06/09/15 0921 06/09/15 1641 06/10/15 0305  HGB 8.9* 8.8* 8.9*  HCT 29.6* 29.4* 28.3*  WBC 12.9* 12.4* 10.3  PLT 515* 537* 561*   Dg Chest 2 View  06/09/2015  CLINICAL DATA:  Shortness of breath 1 week. EXAM: CHEST  2 VIEW COMPARISON:  May 04, 2015. FINDINGS: Stable cardiomediastinal silhouette. No pneumothorax is noted. Increased right basilar opacity is noted concerning for worsening atelectasis, edema or infiltrate. Mildly increased left perihilar opacity is noted concerning for atelectasis or infiltrate. Old comminuted proximal right humeral fracture is noted. Upper thoracic vertebral body compression fracture is noted. IMPRESSION: Increased right basilar opacity concerning for worsening atelectasis, edema or inflammation. Mildly increased left perihilar opacity concerning for atelectasis or infiltrate. Electronically Signed   By: Marijo Conception, M.D.   On: 06/09/2015 09:48   Ct Chest Wo  Contrast  06/09/2015  CLINICAL DATA:  Pneumonia. EXAM: CT CHEST WITHOUT CONTRAST TECHNIQUE: Multidetector CT imaging of the chest was performed following the standard protocol without IV contrast. COMPARISON:  November 02, 2008 FINDINGS: The trachea and right-sided airways are normal. There is mild thickening of the left central and lower lobe bronchi without any discrete masses or intraluminal filling defects. Emphysematous changes are seen in the lungs. Ground-glass in the upper lobes on the previous study persists. However, there is now focal infiltrate in both upper lobes as well, more marked on the left than the right. Mild patchy opacity is seen in the left base also. The right lower lobe is clear. There is volume loss in the left upper lobe, likely due to the left-sided opacity. No discrete masses are seen. Evaluation for pulmonary nodules is limited due to the bilateral pulmonary opacity. There is a mildly enlarged precarinal node measuring 16 mm in short axis, nonspecific but possibly reactive. A few other shotty nodes are seen as well, all larger in the interval. A subcarinal  node measures 15 mm. The central pulmonary arteries are normal. Coronary artery calcifications. The heart is unchanged. No effusions. Evaluation of the upper abdomen is limited but unremarkable. There is significant loss of height of of the T5 vertebral body, new in the interval. Air in the collapsed vertebral body suggests this may be chronic. No other bony abnormalities. IMPRESSION: 1. Bilateral upper lobe infiltrates, left greater than right, with mild patchy opacity in the left base as well. The findings are consistent with a multi focal pneumonia or aspiration. Recommend clinical correlation and follow-up to resolution. 2. There is volume loss in the left upper lobe. There is mild thickening of the central airways which could be inflammatory. No discrete mass is seen. However, no contrast was given on today's study. Recommend  attention on follow-up. 3. Prominent and mildly enlarged lymph nodes are nonspecific but could be reactive. Recommend attention on follow-up. 4. Significant loss of height of T5, age indeterminate but possibly chronic. Recommend clinical correlation. Electronically Signed   By: Dorise Bullion III M.D   On: 06/09/2015 21:58    ASSESSMENT / PLAN: Pneumonia- multilobar. Agree with management as HCAP. Suspect recurrent aspiration and viral infections over last several months. Upper zone predominance not typical for aspiration, but daughter never told of specific culture identifications elsewhere. Would like to attempt better ventilation with mobilization of secretions for culture. Interesting that CT from years ago showed similar upper zone ground glass density.  P: Continue current abx pending clarification of course.  Respiratory Failure- A on C, hypoxic and hypercapneic. Denies diagnosed OSA, but probable obesity-hypoventilation. Sedatives and supplemental O2 need to be used conservatively.  P: Mobilize early and often, up in chair, PT/OT Incentive spirometer Goal O2 sat 89-92% BIPAP per RT ok if tolerated Elevate head of bed  CD Young, MD Pulmonary and [place] Pager: [phone]  06/10/2015, 5:17 PM

## 2015-06-10 NOTE — Progress Notes (Signed)
Pt working harder to breathe, sats dropping to low 80's.  MD notified and Respiratory notified to place pt back on bipap.  respiratory wants to trial pt on cpap first.  Will continue to closely monitor.

## 2015-06-10 NOTE — Progress Notes (Signed)
Patient has refused CPAP for tonight. She does not wear CPAP or BIPAP at home. She says she wants to try to wear nasal cannula tonight. Patient in no distress. Patient said she would call if she changed her mind.

## 2015-06-10 NOTE — Progress Notes (Signed)
Pt. Was placed on cpap tonight and after tolerating cpap for a while pt. Began to desat into the 80's. Pt. Was placed back on 6L nasal cannula by RN. Pt. sats currently in the 90's.

## 2015-06-10 NOTE — Progress Notes (Signed)
fio2 was increased to 40% d/t sat 77-86% after pt was using bedpan.  Sat now 94% on 40% bipap.

## 2015-06-10 NOTE — Progress Notes (Signed)
Family Medicine Teaching Service Daily Progress Note Intern Pager: [phone]  Patient name: [name] Medical record number: [id number] Date of birth: 07-09-1945 Age: 70 y.o. Gender: female  Primary Care Provider: Glenda Chroman, MD Consultants: None Code Status: DNR   Pt Overview and Major Events to Date:  5/5: admitted for SOB  Assessment and Plan: [name] is a 70 y.o. female presenting with shortness of breath and cough. PMH is significant for COPD, HCAP, HTN,   Dyspnea/Respiratory failure: She has been hospitalized at outside locations on three separate times for reported PNA. She feels improved today and not having to work to breath. She was placed on CPAP last night and had a desaturation into the 70's. She was placed on BiPAP today after having some desaturations into the low 80's while on 6L [place]. She is on baseline 3L [place] O2 at home.  - obtain ABG (not stat) since she was placed on BiPAP this morning.  - if she is not having any improvement in her breathing and is requiring BiPAP through the morning and early afternoon, then I would consult pulmonology.  - goal saturations is 88-92% - Continue IV steroid today. Consider switching to PO steroids tomorrow.  - Duonebs q 4hrs + albuterol q4hrs as needed for shortness of breath  - Continue home Symbicort - Continue home Spiriva - NPO while on BIPAP - BNP: 290.2  Multifocal PNA vs aspiration: CT scan suggesting these findings. She had 3 admissions at outside hospital in three months for pneumonia. She is from nursing facility.  - continue vancomycin and zosyn - F/u blood cultures.   DM-2: CBG . Discharged on SSI Novolog & metformin 500 mg twice a day per discharge paper from recent hospitalization. - SSI thin. Adjust as needed - holding metformin while NPO  - CBG q6h while NPO, then ACHS when she starts eating  Hypertension: normotensive here. 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 Follow up X-ray on 11/01/2015 showed impacted nondisplaced but less than 45 angulation in any fragment and less than 1 cm displacement.  -Tylenol as needed for pain - restarted oxy IR 5 mg Q12 PRN. She is on chronic narcotics for chronic back pain. Usually getting them Q4 or Q6   Anxiety/Insomnia/?Depression: Prozac 60 mg daily, Diazepam 5 mg twice a day prn , temazepam 15 mg at bedtime as needed.  -continue home prozac -Hold benzos for now -Watch for withdrawal from benzo if she has been taking  History of intermittent delirium: Zyprexa 10 mg at bedtime. -Zyprexa as needed  FEN/GI: Carb modified, saline lock  PPx: lovenox   Disposition: pending improvement   Subjective:  Not struggling to breath and not feeling short of breath. Feeling improved since admission.   Objective: Temp:  [97.9 F (36.6 C)-98.6 F (37 C)] 98.2 F (36.8 C) (05/06 0900) Pulse Rate:  [87-109] 87 (05/06 0954) Resp:  [16-42] 25 (05/06 0954) BP: (95-151)/(38-108) 120/59 mmHg (05/06 0800) SpO2:  [91 %-100 %] 92 % (05/06 0954) Weight:  [161 lb (73.029 kg)] 161 lb (73.029 kg) (05/06 0400) Physical Exam: General: Elderly female  Cardiovascular: RRR, S1S2, no edema  Respiratory: clear breath sounds and no crackles or wheezes.  Abdomen: soft, Nt ND Extremities: moves all freely   Laboratory:  Recent Labs Lab 06/09/15  [id number] 06/09/15 1641 06/10/15 0305  WBC 12.9* 12.4* 10.3  HGB 8.9* 8.8* 8.9*  HCT 29.6* 29.4* 28.3*  PLT 515* 537* 561*    Recent Labs Lab 06/09/15 0921 06/09/15 1641 06/10/15 0305  NA 137  --  137  K 4.8  --  4.4  CL 94*  --  92*  CO2 33*  --  36*  BUN 8  --  11  CREATININE 0.74 0.74 0.80  CALCIUM 8.4*  --  8.6*  PROT  --   --  6.2*  BILITOT  --   --  0.3  ALKPHOS  --   --  53  ALT  --   --  14  AST  --   --  12*   GLUCOSE 157*  --  174*    Imaging/Diagnostic Tests: Ct Chest Wo Contrast  06/09/2015  CLINICAL DATA:  Pneumonia. EXAM: CT CHEST WITHOUT CONTRAST TECHNIQUE: Multidetector CT imaging of the chest was performed following the standard protocol without IV contrast. COMPARISON:  November 02, 2008 FINDINGS: The trachea and right-sided airways are normal. There is mild thickening of the left central and lower lobe bronchi without any discrete masses or intraluminal filling defects. Emphysematous changes are seen in the lungs. Ground-glass in the upper lobes on the previous study persists. However, there is now focal infiltrate in both upper lobes as well, more marked on the left than the right. Mild patchy opacity is seen in the left base also. The right lower lobe is clear. There is volume loss in the left upper lobe, likely due to the left-sided opacity. No discrete masses are seen. Evaluation for pulmonary nodules is limited due to the bilateral pulmonary opacity. There is a mildly enlarged precarinal node measuring 16 mm in short axis, nonspecific but possibly reactive. A few other shotty nodes are seen as well, all larger in the interval. A subcarinal node measures 15 mm. The central pulmonary arteries are normal. Coronary artery calcifications. The heart is unchanged. No effusions. Evaluation of the upper abdomen is limited but unremarkable. There is significant loss of height of of the T5 vertebral body, new in the interval. Air in the collapsed vertebral body suggests this may be chronic. No other bony abnormalities. IMPRESSION: 1. Bilateral upper lobe infiltrates, left greater than right, with mild patchy opacity in the left base as well. The findings are consistent with a multi focal pneumonia or aspiration. Recommend clinical correlation and follow-up to resolution. 2. There is volume loss in the left upper lobe. There is mild thickening of the central airways which could be inflammatory. No discrete mass  is seen. However, no contrast was given on today's study. Recommend attention on follow-up. 3. Prominent and mildly enlarged lymph nodes are nonspecific but could be reactive. Recommend attention on follow-up. 4. Significant loss of height of T5, age indeterminate but possibly chronic. Recommend clinical correlation. Electronically Signed   By: Dorise Bullion III M.D   On: 06/09/2015 21:58     Rosemarie Ax, MD 06/10/2015, 10:29 AM PGY-3, [place] Intern pager: [phone], text pages welcome

## 2015-06-11 DIAGNOSIS — D6489 Other specified anemias: Secondary | ICD-10-CM

## 2015-06-11 LAB — IRON AND TIBC
Iron: 31 ug/dL (ref 28–170)
Saturation Ratios: 12 % (ref 10.4–31.8)
TIBC: 265 ug/dL (ref 250–450)
UIBC: 234 ug/dL

## 2015-06-11 LAB — CBC
HCT: 28.9 % — ABNORMAL LOW (ref 36.0–46.0)
Hemoglobin: 8.8 g/dL — ABNORMAL LOW (ref 12.0–15.0)
MCH: 26.6 pg (ref 26.0–34.0)
MCHC: 30.4 g/dL (ref 30.0–36.0)
MCV: 87.3 fL (ref 78.0–100.0)
PLATELETS: 604 10*3/uL — AB (ref 150–400)
RBC: 3.31 MIL/uL — AB (ref 3.87–5.11)
RDW: 15.8 % — ABNORMAL HIGH (ref 11.5–15.5)
WBC: 15.2 10*3/uL — ABNORMAL HIGH (ref 4.0–10.5)

## 2015-06-11 LAB — BASIC METABOLIC PANEL
Anion gap: 11 (ref 5–15)
BUN: 7 mg/dL (ref 6–20)
CHLORIDE: 90 mmol/L — AB (ref 101–111)
CO2: 34 mmol/L — ABNORMAL HIGH (ref 22–32)
CREATININE: 0.83 mg/dL (ref 0.44–1.00)
Calcium: 8.7 mg/dL — ABNORMAL LOW (ref 8.9–10.3)
GFR calc Af Amer: >60 mL/min (ref >60)
GLUCOSE: 279 mg/dL — AB (ref 65–99)
POTASSIUM: 4.1 mmol/L (ref 3.5–5.1)
Sodium: 135 mmol/L (ref 135–145)

## 2015-06-11 LAB — RESPIRATORY PANEL BY PCR
Adenovirus: NOT DETECTED
Bordetella pertussis: NOT DETECTED
CORONAVIRUS HKU1-RVPPCR: NOT DETECTED
CORONAVIRUS NL63-RVPPCR: NOT DETECTED
CORONAVIRUS OC43-RVPPCR: NOT DETECTED
Chlamydophila pneumoniae: NOT DETECTED
Coronavirus 229E: NOT DETECTED
INFLUENZA A H1 2009-RVPPR: NOT DETECTED
INFLUENZA A H3-RVPPCR: NOT DETECTED
Influenza A H1: NOT DETECTED
Influenza A: NOT DETECTED
Influenza B: NOT DETECTED
METAPNEUMOVIRUS-RVPPCR: NOT DETECTED
MYCOPLASMA PNEUMONIAE-RVPPCR: NOT DETECTED
PARAINFLUENZA VIRUS 1-RVPPCR: NOT DETECTED
PARAINFLUENZA VIRUS 2-RVPPCR: NOT DETECTED
Parainfluenza Virus 3: NOT DETECTED
Parainfluenza Virus 4: NOT DETECTED
Respiratory Syncytial Virus: NOT DETECTED
Rhinovirus / Enterovirus: NOT DETECTED

## 2015-06-11 LAB — GLUCOSE, CAPILLARY
GLUCOSE-CAPILLARY: 152 mg/dL — AB (ref 65–99)
Glucose-Capillary: 278 mg/dL — ABNORMAL HIGH (ref 65–99)
Glucose-Capillary: 299 mg/dL — ABNORMAL HIGH (ref 65–99)
Glucose-Capillary: 266 mg/dL — ABNORMAL HIGH (ref 65–99)

## 2015-06-11 LAB — RETICULOCYTES
RBC.: 3.31 MIL/uL — AB (ref 3.87–5.11)
Retic Count, Absolute: 86.1 10*3/uL (ref 19.0–186.0)
Retic Ct Pct: 2.6 % (ref 0.4–3.1)

## 2015-06-11 LAB — VITAMIN B12: Vitamin B-12: 372 pg/mL (ref 180–914)

## 2015-06-11 LAB — EXPECTORATED SPUTUM ASSESSMENT W REFEX TO RESP CULTURE: SPECIAL REQUESTS: NORMAL

## 2015-06-11 LAB — EXPECTORATED SPUTUM ASSESSMENT W GRAM STAIN, RFLX TO RESP C

## 2015-06-11 LAB — FERRITIN: FERRITIN: 101 ng/mL (ref 11–307)

## 2015-06-11 LAB — FOLATE: Folate: 12.5 ng/mL (ref >5.9)

## 2015-06-11 LAB — PREPARE RBC (CROSSMATCH)

## 2015-06-11 LAB — ABO/RH: ABO/RH(D): A POS

## 2015-06-11 MED ORDER — OXYCODONE HCL 5 MG PO TABS: 5.0000 mg | ORAL_TABLET | ORAL | Status: DC

## 2015-06-11 MED ORDER — POLYETHYLENE GLYCOL 3350 17 G PO PACK
17.0000 g | PACK | Freq: Two times a day (BID) | ORAL | Status: DC
  Administered 2015-06-11 – 2015-06-15 (×5): 17 g via ORAL
  Filled 2015-06-11 (×8): qty 1

## 2015-06-11 MED ORDER — OXYCODONE HCL 5 MG PO TABS
5.0000 mg | ORAL_TABLET | Freq: Four times a day (QID) | ORAL | Status: DC
  Administered 2015-06-11: 5 mg via ORAL
  Filled 2015-06-11: qty 1

## 2015-06-11 MED ORDER — OXYCODONE HCL 5 MG PO TABS: 5.0000 mg | ORAL_TABLET | Freq: Four times a day (QID) | ORAL | Status: DC

## 2015-06-11 MED ORDER — DIAZEPAM 5 MG PO TABS
5.0000 mg | ORAL_TABLET | Freq: Two times a day (BID) | ORAL | Status: DC
  Administered 2015-06-11 – 2015-06-16 (×10): 5 mg via ORAL
  Filled 2015-06-11 (×11): qty 1

## 2015-06-11 MED ORDER — SENNOSIDES-DOCUSATE SODIUM 8.6-50 MG PO TABS
1.0000 | ORAL_TABLET | Freq: Every day | ORAL | Status: DC
  Administered 2015-06-11 – 2015-06-14 (×4): 1 via ORAL
  Filled 2015-06-11 (×5): qty 1

## 2015-06-11 MED ORDER — OXYCODONE HCL 5 MG PO TABS
5.0000 mg | ORAL_TABLET | ORAL | Status: DC
  Administered 2015-06-11 – 2015-06-16 (×21): 5 mg via ORAL
  Filled 2015-06-11 (×21): qty 1

## 2015-06-11 MED ORDER — SODIUM CHLORIDE 0.9 % IV SOLN
Freq: Once | INTRAVENOUS | Status: AC
  Administered 2015-06-11: 20:00:00 via INTRAVENOUS

## 2015-06-11 NOTE — Progress Notes (Signed)
Family Medicine Teaching Service Daily Progress Note Intern Pager: [phone]  Patient name: [name] Medical record number: [id number] Date of birth: October 16, 1945 Age: 70 y.o. Gender: female  Primary Care Provider: Glenda Chroman, MD Consultants: None Code Status: DNR   Pt Overview and Major Events to Date:  5/5: admitted for SOB  Assessment and Plan: [name] is a 70 y.o. female presenting with shortness of breath and cough. PMH is significant for COPD, HCAP, HTN,   Dyspnea/Respiratory failure: She has been hospitalized at outside locations on three separate times for reported PNA. She feels improved today and not having to work to breath. 89% once this am on 5L, otherwise sats ok. She is on baseline 3L [place] O2 at home.  - Appreciate pulm recs.  - Per pulm > RVP, nasal viral swab, Cont current course Abx.  - Pending  - goal saturations is 89-92% - Continue IV steroids for now.  - Duonebs q 4hrs + albuterol q4hrs as needed for shortness of breath  - Continue home Symbicort - Continue home Spiriva - NPO while on BIPAP - BNP: 290.2  Multifocal PNA vs aspiration: CT scan suggesting these findings. She had 3 admissions at outside hospital in three months for pneumonia. She is from nursing facility.  - continue vancomycin and zosyn - F/u blood cultures.  - Continuing abx as they are for now.  - Will consider switching to po when resp status is more improved.  - Appreciate pulm recs.  - CXR 5/8   Anemia - Normocytic. No clear source.  - Anemia panel.  - Stool hemoccult.   DM-2: CBG . Discharged on SSI Novolog & metformin 500 mg twice a day per discharge paper from recent hospitalization. - SSI thin. Adjust as needed - holding metformin while NPO  - CBG q6h while NPO, then ACHS when she starts eating  Hypertension: normotensive here. On lisinopril 40 at home -continue home home lisinopril  Tachycardia: on diltiazem 45 mg four times a day  -Continue home diltiazem    OA and Fracture of proximal right humerus: treated with sling per ortho note from 10/21/2014. Follow up X-ray on 11/01/2015 showed impacted nondisplaced but less than 45 angulation in any fragment and less than 1 cm displacement.  -Tylenol as needed for pain - restarted oxy IR 5 mg Q12 PRN. She is on chronic narcotics for chronic back pain. Usually getting them Q4 or Q6   Anxiety/Insomnia/?Depression: Prozac 60 mg daily, Diazepam 5 mg twice a day prn , temazepam 15 mg at bedtime as needed.  -continue home prozac -started on diazepam yesterday for anxiety.   History of intermittent delirium: Zyprexa 10 mg at bedtime. -Zyprexa as needed  FEN/GI: Carb modified, saline lock  PPx: lovenox   Disposition: pending improvement   Subjective:  Feeling better. Still requiring more oxygen.   Objective: Temp:  [97.8 F (36.6 C)-98.6 F (37 C)] 97.8 F (36.6 C) (05/07 0510) Pulse Rate:  [72-98] 95 (05/07 0800) Resp:  [18-30] 22 (05/07 0404) BP: (107-134)/(51-76) 123/76 mmHg (05/07 0800) SpO2:  [89 %-96 %] 92 % (05/07 0902) FiO2 (%):  [40 %] 40 % (05/06 1151) Weight:  [146 lb 8 oz (66.452 kg)] 146 lb 8 oz (66.452 kg) (05/07 0510) Physical Exam: General: Elderly female  Cardiovascular: RRR, S1S2, no edema  Respiratory: Poor air movement, fewer wheezes today. No rales or crackles, Appropriate rate, labored with exertion. 5L O2. [place].  Abdomen: soft, Nt ND Extremities: moves all freely  Laboratory:  Recent Labs Lab 06/09/15 0921 06/09/15 1641 06/10/15 0305  WBC 12.9* 12.4* 10.3  HGB 8.9* 8.8* 8.9*  HCT 29.6* 29.4* 28.3*  PLT 515* 537* 561*    Recent Labs Lab 06/09/15 0921 06/09/15 1641 06/10/15 0305  NA 137  --  137  K 4.8  --  4.4  CL 94*  --  92*  CO2 33*  --  36*  BUN 8  --  11  CREATININE 0.74 0.74 0.80  CALCIUM 8.4*  --  8.6*  PROT  --   --  6.2*  BILITOT  --   --  0.3  ALKPHOS  --   --  53  ALT  --   --  14  AST  --   --  12*  GLUCOSE 157*  --  174*     Imaging/Diagnostic Tests: No results found.   Aquilla Hacker, MD 06/11/2015, 10:05 AM PGY-2, [place] Intern pager: [phone], text pages welcome

## 2015-06-11 NOTE — Progress Notes (Signed)
Name: [name] MRN: [id number] DOB: [birth year]    ADMISSION DATE:  06/09/2015 CONSULTATION DATE:  06/10/15  REFERRING MD : Greig Right  CHIEF COMPLAINT: short of breath  BRIEF PATIENT DESCRIPTION: 63yoF former smoker with known COPD on 3L O2 at home. No current pulmonary f/u.  Describes recurrent shortness of breath over past 2 months, seeming to respond to antibiotics/ steroids. Initially hosp at [facility], then [facility] and now here with interim rehab SNF. Being treated for presumed pneumonia w AECOPD. Required BIPAP for CO2 retention with hypoxia overnight, but says she can't tell it helped. PCCM asked to review and assist. Initial PH 7.30/pCO2 81.8/ pO2 138/  HCO3 41 CT chest shows emphysema and upper zone diffuse ground glass, very similar to 10/5/06on my review. Recent CXRs- hypoventilation, hazy infiltrates.  SIGNIFICANT EVENTS    STUDIES:  CT chest w/o 06/09/15 ABG on 6L o2 5/6- 7.42/ pO2 70.5/ pCO2 59.9/ HCO3 37.4   SUBJECTIVE:  Breathing may be better. Nasal swab viral assay done and pending. Scant sputum- none collected. Neb treatments help now. Constipated.Refused CPAP last night. Daughter in room  VITAL SIGNS: Temp:  [97.8 F (36.6 C)-98.6 F (37 C)] 97.8 F (36.6 C) (05/07 0510) Pulse Rate:  [72-98] 84 (05/07 0404) Resp:  [18-30] 22 (05/07 0404) BP: (107-134)/(51-71) 108/66 mmHg (05/07 0400) SpO2:  [90 %-96 %] 92 % (05/07 0404) FiO2 (%):  [40 %] 40 % (05/06 1151) Weight:  [66.452 kg (146 lb 8 oz)] 66.452 kg (146 lb 8 oz) (05/07 0510)  PHYSICAL EXAMINATION: Daughter helping with breakfast General: Obese, passive, awake, oriented elderly woman, NAD Neuro:  Oriented, speech clear, moves extrems x 4, non-focal HEENT:  Speech clear, no stridor, no JVD, grossly nl vision and hearing Cardiovascular:  RRR, no m/g/r, no peripheral edema Lungs: few minor crackles, dry cough with deep breath, unlabored on nasal O2 Abdomen:  Obese, soft,non-tender, no HSM noted, BS  present Musculoskeletal:  Weak Skin:  No rash, tatoo L ankle   Recent Labs Lab 06/09/15 0921 06/09/15 1641 06/10/15 0305  NA 137  --  137  K 4.8  --  4.4  CL 94*  --  92*  CO2 33*  --  36*  BUN 8  --  11  CREATININE 0.74 0.74 0.80  GLUCOSE 157*  --  174*    Recent Labs Lab 06/09/15 0921 06/09/15 1641 06/10/15 0305  HGB 8.9* 8.8* 8.9*  HCT 29.6* 29.4* 28.3*  WBC 12.9* 12.4* 10.3  PLT 515* 537* 561*   Dg Chest 2 View  06/09/2015  CLINICAL DATA:  Shortness of breath 1 week. EXAM: CHEST  2 VIEW COMPARISON:  May 04, 2015. FINDINGS: Stable cardiomediastinal silhouette. No pneumothorax is noted. Increased right basilar opacity is noted concerning for worsening atelectasis, edema or infiltrate. Mildly increased left perihilar opacity is noted concerning for atelectasis or infiltrate. Old comminuted proximal right humeral fracture is noted. Upper thoracic vertebral body compression fracture is noted. IMPRESSION: Increased right basilar opacity concerning for worsening atelectasis, edema or inflammation. Mildly increased left perihilar opacity concerning for atelectasis or infiltrate. Electronically Signed   By: Marijo Conception, M.D.   On: 06/09/2015 09:48   Ct Chest Wo Contrast  06/09/2015  CLINICAL DATA:  Pneumonia. EXAM: CT CHEST WITHOUT CONTRAST TECHNIQUE: Multidetector CT imaging of the chest was performed following the standard protocol without IV contrast. COMPARISON:  November 02, 2008 FINDINGS: The trachea and right-sided airways are normal. There is mild thickening of the left central  and lower lobe bronchi without any discrete masses or intraluminal filling defects. Emphysematous changes are seen in the lungs. Ground-glass in the upper lobes on the previous study persists. However, there is now focal infiltrate in both upper lobes as well, more marked on the left than the right. Mild patchy opacity is seen in the left base also. The right lower lobe is clear. There is volume loss  in the left upper lobe, likely due to the left-sided opacity. No discrete masses are seen. Evaluation for pulmonary nodules is limited due to the bilateral pulmonary opacity. There is a mildly enlarged precarinal node measuring 16 mm in short axis, nonspecific but possibly reactive. A few other shotty nodes are seen as well, all larger in the interval. A subcarinal node measures 15 mm. The central pulmonary arteries are normal. Coronary artery calcifications. The heart is unchanged. No effusions. Evaluation of the upper abdomen is limited but unremarkable. There is significant loss of height of of the T5 vertebral body, new in the interval. Air in the collapsed vertebral body suggests this may be chronic. No other bony abnormalities. IMPRESSION: 1. Bilateral upper lobe infiltrates, left greater than right, with mild patchy opacity in the left base as well. The findings are consistent with a multi focal pneumonia or aspiration. Recommend clinical correlation and follow-up to resolution. 2. There is volume loss in the left upper lobe. There is mild thickening of the central airways which could be inflammatory. No discrete mass is seen. However, no contrast was given on today's study. Recommend attention on follow-up. 3. Prominent and mildly enlarged lymph nodes are nonspecific but could be reactive. Recommend attention on follow-up. 4. Significant loss of height of T5, age indeterminate but possibly chronic. Recommend clinical correlation. Electronically Signed   By: Dorise Bullion III M.D   On: 06/09/2015 21:58    ASSESSMENT / PLAN: Pneumonia- multilobar. Agree with management as HCAP. Suspect recurrent aspiration and viral infections over last several months. Upper zone predominance not typical for aspiration, but daughter never told of specific culture identifications elsewhere. Would like to attempt better ventilation with mobilization of secretions for culture. Interesting that CT from years ago showed  similar upper zone ground glass density.  P: Continue current abx pending clarification of course. Sputum cx and nasal viral swab ordered CXR 5/8- ordered  Respiratory Failure- A on C, hypoxic and hypercapneic. Denies diagnosed OSA, but probable obesity-hypoventilation. Sedatives and supplemental O2 need to be used conservatively. Key will be mobilization to improve lung expansion P: Mobilize early and often, up in chair, PT/OT Incentive spirometer ordered Goal O2 sat 89-92% BIPAP per RT ok if tolerated Elevate head of bed  COPD mixed type- exacerbation P: Continue bronchodilators, steroids  ANEMIA- unspecified, possibly chronic disease P: recommend basic eval incl stool for OB  CD Ariyon Gerstenberger, MD Pulmonary and [place] Pager: [phone]  06/11/2015, 8:42 AM

## 2015-06-11 NOTE — Progress Notes (Signed)
Pt refusing CPAP at this time. She states that if she needs the Bipap later for increased WOB she will wear it. RT will continue to monitor

## 2015-06-12 ENCOUNTER — Inpatient Hospital Stay (HOSPITAL_COMMUNITY): Payer: Medicare Other

## 2015-06-12 LAB — GLUCOSE, CAPILLARY
Glucose-Capillary: 159 mg/dL — ABNORMAL HIGH (ref 65–99)
Glucose-Capillary: 306 mg/dL — ABNORMAL HIGH (ref 65–99)
Glucose-Capillary: 340 mg/dL — ABNORMAL HIGH (ref 65–99)
Glucose-Capillary: 255 mg/dL — ABNORMAL HIGH (ref 65–99)

## 2015-06-12 LAB — HEMOGLOBIN AND HEMATOCRIT, BLOOD
HCT: 32.7 % — ABNORMAL LOW (ref 36.0–46.0)
HEMOGLOBIN: 9.8 g/dL — AB (ref 12.0–15.0)

## 2015-06-12 LAB — CBC
HCT: 33.6 % — ABNORMAL LOW (ref 36.0–46.0)
HEMOGLOBIN: 10.5 g/dL — AB (ref 12.0–15.0)
MCH: 28.2 pg (ref 26.0–34.0)
MCHC: 31.3 g/dL (ref 30.0–36.0)
MCV: 90.1 fL (ref 78.0–100.0)
Platelets: 537 10*3/uL — ABNORMAL HIGH (ref 150–400)
RBC: 3.73 MIL/uL — AB (ref 3.87–5.11)
RDW: 15.5 % (ref 11.5–15.5)
WBC: 13.7 10*3/uL — ABNORMAL HIGH (ref 4.0–10.5)

## 2015-06-12 LAB — TYPE AND SCREEN
ABO/RH(D): A POS
ANTIBODY SCREEN: NEGATIVE
UNIT DIVISION: 0

## 2015-06-12 LAB — BASIC METABOLIC PANEL
Anion gap: 12 (ref 5–15)
BUN: 7 mg/dL (ref 6–20)
CHLORIDE: 90 mmol/L — AB (ref 101–111)
CO2: 34 mmol/L — AB (ref 22–32)
Calcium: 8.6 mg/dL — ABNORMAL LOW (ref 8.9–10.3)
Creatinine, Ser: 0.88 mg/dL (ref 0.44–1.00)
GFR calc Af Amer: >60 mL/min (ref >60)
GFR calc non Af Amer: >60 mL/min (ref >60)
GLUCOSE: 271 mg/dL — AB (ref 65–99)
POTASSIUM: 3.5 mmol/L (ref 3.5–5.1)
Sodium: 136 mmol/L (ref 135–145)

## 2015-06-12 LAB — OCCULT BLOOD X 1 CARD TO LAB, STOOL: Fecal Occult Bld: NEGATIVE

## 2015-06-12 MED ORDER — METHYLPREDNISOLONE SODIUM SUCC 40 MG IJ SOLR: 40.0000 mg | Freq: Every day | INTRAMUSCULAR | Status: DC

## 2015-06-12 MED ORDER — PREDNISONE 20 MG PO TABS
50.0000 mg | ORAL_TABLET | Freq: Every day | ORAL | Status: DC
  Administered 2015-06-13 – 2015-06-14 (×2): 50 mg via ORAL
  Filled 2015-06-12 (×2): qty 2

## 2015-06-12 MED ORDER — DEXTROSE 5 % IV SOLN
1.0000 g | INTRAVENOUS | Status: DC
  Administered 2015-06-12 – 2015-06-13 (×2): 1 g via INTRAVENOUS
  Filled 2015-06-12 (×2): qty 10

## 2015-06-12 NOTE — Progress Notes (Signed)
Daughter is Cheri--lives i [place]---[phone]. Alternate next of kin who lives here in town is [place] [phone].

## 2015-06-12 NOTE — Progress Notes (Signed)
Inpatient Diabetes Program Recommendations  AACE/ADA: New Consensus Statement on Inpatient Glycemic Control (2015)  Target Ranges:  Prepandial:   less than 140 mg/dL      Peak postprandial:   less than 180 mg/dL (1-2 hours)      Critically ill patients:  140 - 180 mg/dL   Review of Glycemic Control Results for ARYANI, GANCARZ (MRN [id number]) as of 06/12/2015 15:02  Ref. Range 06/11/2015 11:40 06/11/2015 16:39 06/11/2015 21:14 06/12/2015 07:36 06/12/2015 11:46  Glucose-Capillary Latest Ref Range: 65-99 mg/dL 278 (H) 299 (H) 266 (H) 159 (H) 306 (H)    Inpatient Diabetes Program Recommendations:  Correction (SSI): consider increasing Novolog to moderate scale during steroid therapy Thank you  Raoul Pitch BSN, RN,CDE Inpatient Diabetes Coordinator [phone] (team pager)

## 2015-06-12 NOTE — Care Management Important Message (Signed)
Important Message  Patient Details  Name: [name] MRN: [id number] Date of Birth: Sep 06, 1945   Medicare Important Message Given:  Yes    Denaja, Hohl, RN 06/12/2015, 12:09 PM

## 2015-06-12 NOTE — Progress Notes (Signed)
Pt removed from Bipap at this time due to pt not tolerating well.  Pt was placed back on a 5LPM [place] and SATS are 93%.  Rt administered Duoneb and pt R is now 19-24BPM.  Rt will continue to monitor.

## 2015-06-12 NOTE — Progress Notes (Signed)
Family Medicine Teaching Service Daily Progress Note Intern Pager: [phone]  Patient name: [name] Medical record number: [id number] Date of birth: October 09, 1945 Age: 70 y.o. Gender: female  Primary Care Provider: Glenda Chroman, MD Consultants: None Code Status: DNR   Pt Overview and Major Events to Date:  5/5: admitted for SOB  Assessment and Plan: [name] is a 70 y.o. female presenting with shortness of breath and cough. PMH is significant for COPD, HCAP, HTN,   Dyspnea/Respiratory failure: Continues to require oxygen, currently on 6 Liters of oxygen. 3 previous hospitalizations. States that she feels improved today,   reported PNA.89% once this am on 5L, otherwise sats ok. She is on baseline 3L [place] O2 at home.  - Appreciate pulm recs.  - goal saturations is 89-92% - s/p IV steriods x 2 days, continue Prednisone 50 mg daily  - Duonebs q 4hrs + albuterol q4hrs as needed for shortness of breath  - Continue home Symbicort - Continue home Spiriva - BNP: 290.2 - ECHO   Multifocal PNA vs aspiration: CT scan suggesting these findings. She had 3 admissions at outside hospital in three months for pneumonia. She is from nursing facility. WBC trending down at 13.7 - s/p vancomycin and zosyn  - Continue ceftriaxone  - F/u blood cultures.  -  pulm recs, will touch base this morning  - CXR tomorrow morning   Anemia - Normocytic. Hgb 10.5  - Anemia panel.  - Stool hemoccult.   DM-2: CBG . Discharged on SSI Novolog & metformin 500 mg twice a day per discharge paper from recent hospitalization. - SSI thin. Adjust as needed - holding metformin while NPO  - CBG q6h while NPO, then ACHS when she starts eating  Hypertension: normotensive here. 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 Follow  up X-ray on 11/01/2015 showed impacted nondisplaced but less than 45 angulation in any fragment and less than 1 cm displacement.  -Tylenol as needed for pain - restarted oxy IR 5 mg Q12 PRN. She is on chronic narcotics for chronic back pain. Usually getting them Q4 or Q6   Anxiety/Insomnia/?Depression: Prozac 60 mg daily, Diazepam 5 mg twice a day prn , temazepam 15 mg at bedtime as needed.  -continue home prozac -started on diazepam yesterday for anxiety.   History of intermittent delirium: Zyprexa 10 mg at bedtime. -Zyprexa as needed  FEN/GI: Carb modified, saline lock  PPx: lovenox   Disposition: pending improvement   Subjective:  Patient received a transfusion of 1 unit overnight. Later that night, patient seemed to having increase work of breathing and desaturations into the 70s.  Patient was therefore started on BiPAP overnight and weaned off later this morning.   Objective: Temp:  [97.6 F (36.4 C)-98.5 F (36.9 C)] 97.7 F (36.5 C) (05/08 0418) Pulse Rate:  [42-98] 91 (05/08 0443) Resp:  [14-41] 36 (05/08 0443) BP: (123-149)/(67-88) 149/85 mmHg (05/08 0400) SpO2:  [77 %-94 %] 93 % (05/08 0443) FiO2 (%):  [40 %] 40 % (05/08 0443) Weight:  [143 lb 8 oz (65.091 kg)] 143 lb 8 oz (65.091 kg) (05/08 0418) Physical Exam: General: Elderly female  Cardiovascular: RRR, S1S2, no edema  Respiratory: 6L O2. [employer],tachypneic on exam, decreased breath sounds in bibasilar, some crackles in upper airways  Abdomen: soft, Nt  ND Extremities: moves all freely   Laboratory:  Recent Labs Lab 06/09/15 1641 06/10/15 0305 06/11/15 1114 06/12/15 0311  WBC 12.4* 10.3 15.2*  --   HGB 8.8* 8.9* 8.8* 9.8*  HCT 29.4* 28.3* 28.9* 32.7*  PLT 537* 561* 604*  --     Recent Labs Lab 06/09/15 0921 06/09/15 1641 06/10/15 0305 06/11/15 1114  NA 137  --  137 135  K 4.8  --  4.4 4.1  CL 94*  --  92* 90*  CO2 33*  --  36* 34*  BUN 8  --  11 7  CREATININE 0.74 0.74 0.80 0.83  CALCIUM 8.4*   --  8.6* 8.7*  PROT  --   --  6.2*  --   BILITOT  --   --  0.3  --   ALKPHOS  --   --  53  --   ALT  --   --  14  --   AST  --   --  12*  --   GLUCOSE 157*  --  174* 279*    Imaging/Diagnostic Tests: No results found.   Asiyah Cletis Media, MD 06/12/2015, 7:20 AM PGY-1, [place] Intern pager: [phone], text pages welcome

## 2015-06-12 NOTE — Progress Notes (Signed)
Pharmacy Antibiotic Note  [name] is a 71 y.o. female continues on day #5 vancomycin and zosyn for HCAP. Renal function stable. She is afebrile and WBC trending down. CXR shows slight improvement but overall persistent opacities bilaterally c/w pneumonia.  Plan: 1) Continue vancomycin 1g IV q12 - consider trough soon if to continue 2) Continue zosyn 3.375g IV q8 (4 hour infusion)  Height: 5\' 4"  (162.6 cm) Weight: 143 lb 8 oz (65.091 kg) IBW/kg (Calculated) : 54.7  Temp (24hrs), Avg:98 F (36.7 C), Min:97.6 F (36.4 C), Max:98.7 F (37.1 C)   Recent Labs Lab 06/09/15 0921 06/09/15 0934 06/09/15 1328 06/09/15 1641 06/10/15 0305 06/11/15 1114 06/12/15 0959  WBC 12.9*  --   --  12.4* 10.3 15.2* 13.7*  CREATININE 0.74  --   --  0.74 0.80 0.83  --   LATICACIDVEN  --  0.60 0.61  --   --   --   --     Estimated Creatinine Clearance: 54.5 mL/min (by C-G formula based on Cr of 0.83).    Allergies  Allergen Reactions  . Aspirin Other (See Comments)    History of gastric ulcers  . Nsaids     Hx of gastric ulcers    Antimicrobials this admission: Cefepime 5/5>>5/5 Zosyn 5/5 >> Vancomycin 5/5>>  Dose adjustments this admission: n/a  Microbiology results: 5/5 Blood x 2: NGTD 5/5 Urine: multiple species 5/7 Sputum cx: recollect  Thank you for allowing pharmacy to be a part of this patient's care.  Deboraha Sprang 06/12/2015 10:53 AM

## 2015-06-12 NOTE — Progress Notes (Signed)
Name: [name] MRN: [id number] DOB: April 20, 1945    ADMISSION DATE:  06/09/2015 CONSULTATION DATE:  06/10/15  REFERRING MD : Greig Right  CHIEF COMPLAINT: short of breath  BRIEF PATIENT DESCRIPTION: 20yoF former smoker with known COPD on 3L O2 at home. No current pulmonary f/u.  Describes recurrent shortness of breath over past 2 months, seeming to respond to antibiotics/ steroids. Initially hosp at [facility], then [facility] and now here with interim rehab SNF. Being treated for presumed pneumonia w AECOPD. Required BIPAP for CO2 retention with hypoxia overnight, but says she can't tell it helped. PCCM asked to review and assist. Initial PH 7.30/pCO2 81.8/ pO2 138/  HCO3 41.  CT chest shows emphysema and upper zone diffuse ground glass, very similar to 11/08/04 on review.  Recent CXRs- hypoventilation, hazy infiltrates.  SIGNIFICANT EVENTS  5/05  Admit with SOB  STUDIES:  CT chest w/o 06/09/15 >> ABG on 6L o2 5/6 >> 7.42/ pO2 70.5/ pCO2 59.9/ HCO3 37.4   SUBJECTIVE:  Pt reports feeling better.  Wore bipap ~ 4 hours last night.  Excited about thought of going home.   Husband is support at home as well as daughter.  Patient reports she has not driven in 5 years due to weakness / SOB fatigue from COPD.  She also notes difficulties swallowing with food getting hung up and coughing it up as much as an hour later.     VITAL SIGNS: Temp:  [97.6 F (36.4 C)-98.7 F (37.1 C)] 98.7 F (37.1 C) (05/08 0900) Pulse Rate:  [42-98] 92 (05/08 0800) Resp:  [14-41] 26 (05/08 0800) BP: (131-160)/(67-88) 160/80 mmHg (05/08 0800) SpO2:  [77 %-95 %] 95 % (05/08 0800) FiO2 (%):  [40 %] 40 % (05/08 0443) Weight:  [143 lb 8 oz (65.091 kg)] 143 lb 8 oz (65.091 kg) (05/08 0418)  PHYSICAL EXAMINATION:   General: Obese female, awake, elderly woman, NAD Neuro:  Oriented, speech clear, moves extrems x 4, non-focal HEENT:  Speech clear, no stridor, no JVD, grossly nl vision and hearing Cardiovascular:  RRR, no  m/g/r, no peripheral edema Lungs: unlabored on nasal 5L O2 Abdomen:  Obese, soft, non-tender, no HSM noted, BS present Musculoskeletal:  No acute deformities, generalized weakness Skin:  No rash, tatoo L ankle   Recent Labs Lab 06/09/15 0921 06/09/15 1641 06/10/15 0305 06/11/15 1114  NA 137  --  137 135  K 4.8  --  4.4 4.1  CL 94*  --  92* 90*  CO2 33*  --  36* 34*  BUN 8  --  11 7  CREATININE 0.74 0.74 0.80 0.83  GLUCOSE 157*  --  174* 279*    Recent Labs Lab 06/09/15 1641 06/10/15 0305 06/11/15 1114 06/12/15 0311  HGB 8.8* 8.9* 8.8* 9.8*  HCT 29.4* 28.3* 28.9* 32.7*  WBC 12.4* 10.3 15.2*  --   PLT 537* 561* 604*  --    Dg Chest Port 1 View  06/12/2015  CLINICAL DATA:  Follow-up pneumonia, on BiPAP, history of COPD, acute and chronic respiratory failure, pneumonia. EXAM: PORTABLE CHEST 1 VIEW COMPARISON:  CT scan of the chest and portable chest x-ray of Jun 09, 2015 FINDINGS: The lungs are adequately inflated. The interstitial markings are coarse bilaterally. Confluent lung markings are noted in the left perihilar and right infrahilar regions. There is no pleural effusion or pneumothorax. The cardiac silhouette is mildly enlarged. The pulmonary vascularity is prominent centrally. The mediastinum is normal in width. The bony thorax exhibits  no acute abnormality. IMPRESSION: Slight interval improvement in the appearance of the pulmonary interstitium. Persistent confluent interstitial density in the left perihilar and right infrahilar regions compatible with pneumonia. Stable cardiomegaly without pulmonary vascular congestion. Electronically Signed   By: David  Martinique M.D.   On: 06/12/2015 07:59    ASSESSMENT / PLAN:   Multilobar Pneumonia - Agree with management as HCAP. Suspect recurrent aspiration and viral infections over last several months. Upper zone predominance not typical for aspiration, but daughter never told of specific culture identifications elsewhere. Would like to  attempt better ventilation with mobilization of secretions for culture. Interesting that CT from years ago showed similar upper zone ground glass density.  P: Continue current abx, narrow as able Sputum cx and nasal viral swab ordered Intermittent CXR Pulmonary hygiene  SLP evaluation for swallowing difficulties, concern for recurrent aspiration  Respiratory Failure - A on C, hypoxic and hypercapneic. Denies diagnosed OSA, but probable obesity-hypoventilation. Sedatives and supplemental O2 need to be used conservatively. Key will be mobilization to improve lung expansion P: Mobilize early and often, up in chair, PT/OT Incentive spirometry ordered Goal O2 sat 89-92% BIPAP per RT PRN for increased WOB, QHS Elevate head of bed  COPD mixed type - with acute exacerbation P:  Continue bronchodilators, steroids  ANEMIA - unspecified, possibly chronic disease P:  recommend basic eval incl stool for OB.  Defer work up to primary SVC.    GLOBAL:  DNR  Noe Gens, NP-C [place] [facility] Pgr: [phone] or if no answer [phone] 06/12/2015, 10:19 AM   STAFF NOTE: I, Merrie Roof, MD FACP have personally reviewed patient's available data, including medical history, events of note, physical examination and test results as part of my evaluation. I have discussed with resident/NP and other care providers such as pharmacist, RN and RRT. In addition, I personally evaluated patient and elicited key findings of: awake, alert, no distress, cry crackles apical, CT reviewed and compared to past, this apical pred process has increased, chornic asp could occur like this with underlying chronic changes form lung dz, copd as well, get SLP, no role steroids, consider SLP wit OBjective trials, DNR noted, reassuring that viral panel neg, would narrow ABX as remains bacterial culture neg, use monotherapy  Lavon Paganini. Titus Mould, MD, [place] Pgr: [place] [facility] 06/12/2015 11:24 AM

## 2015-06-12 NOTE — Progress Notes (Signed)
Patient has refused CPAP for tonight. She does not wear CPAP or BIPAP at home. She says she wants to try to wear nasal cannula tonight. Patient in no distress. Patient said she would call if she changed her mind.

## 2015-06-12 NOTE — Progress Notes (Signed)
RN called to replace bipap on pt due to pain meds given and WOB increasing.  RT will monitor.

## 2015-06-12 NOTE — Progress Notes (Signed)
Pt became SOB and SATS dropped per RN into 70s.  Rt placed pt on bipap and SATS returned to 93% and pt seemed to feel better at this time.  RT will monitor.

## 2015-06-13 ENCOUNTER — Inpatient Hospital Stay (HOSPITAL_COMMUNITY): Payer: Medicare Other

## 2015-06-13 DIAGNOSIS — R0600 Dyspnea, unspecified: Secondary | ICD-10-CM

## 2015-06-13 LAB — BASIC METABOLIC PANEL
ANION GAP: 12 (ref 5–15)
BUN: 9 mg/dL (ref 6–20)
CALCIUM: 8.5 mg/dL — AB (ref 8.9–10.3)
CO2: 36 mmol/L — AB (ref 22–32)
Chloride: 89 mmol/L — ABNORMAL LOW (ref 101–111)
Creatinine, Ser: 0.71 mg/dL (ref 0.44–1.00)
GFR calc Af Amer: >60 mL/min (ref >60)
GFR calc non Af Amer: >60 mL/min (ref >60)
GLUCOSE: 202 mg/dL — AB (ref 65–99)
Potassium: 3.4 mmol/L — ABNORMAL LOW (ref 3.5–5.1)
Sodium: 137 mmol/L (ref 135–145)

## 2015-06-13 LAB — ECHOCARDIOGRAM COMPLETE
Height: 64 in
WEIGHTICAEL: 2272 [oz_av]

## 2015-06-13 LAB — GLUCOSE, CAPILLARY
GLUCOSE-CAPILLARY: 322 mg/dL — AB (ref 65–99)
GLUCOSE-CAPILLARY: 282 mg/dL — AB (ref 65–99)
Glucose-Capillary: 173 mg/dL — ABNORMAL HIGH (ref 65–99)
Glucose-Capillary: 213 mg/dL — ABNORMAL HIGH (ref 65–99)

## 2015-06-13 MED ORDER — AMOXICILLIN-POT CLAVULANATE 875-125 MG PO TABS
1.0000 | ORAL_TABLET | Freq: Two times a day (BID) | ORAL | Status: DC
  Administered 2015-06-13 – 2015-06-16 (×6): 1 via ORAL
  Filled 2015-06-13 (×6): qty 1

## 2015-06-13 MED ORDER — STARCH (THICKENING) PO POWD
ORAL | Status: DC | PRN
  Filled 2015-06-13: qty 227

## 2015-06-13 NOTE — Progress Notes (Signed)
Inpatient Diabetes Program Recommendations  AACE/ADA: New Consensus Statement on Inpatient Glycemic Control (2015)  Target Ranges:  Prepandial:   less than 140 mg/dL      Peak postprandial:   less than 180 mg/dL (1-2 hours)      Critically ill patients:  140 - 180 mg/dL   Results for [name], [name] (MRN [id number]) as of 06/13/2015 10:10  Ref. Range 06/12/2015 07:36 06/12/2015 11:46 06/12/2015 16:57 06/12/2015 21:15 06/13/2015 07:31  Glucose-Capillary Latest Ref Range: 65-99 mg/dL 159 (H) 306 (H) 340 (H) 255 (H) 173 (H)   Review of Glycemic Control  Diabetes history: DM 2 Outpatient Diabetes medications: Glipizide 10 mg BID, Novolog 0-10 units TID, Metformin 500 mg BID, Lantus ?dose Current orders for Inpatient glycemic control: Novolog Sensitive + HS scale  Inpatient Diabetes Program Recommendations: Correction (SSI): consider increasing Novolog to moderate scale during steroid therapy Insulin - Meal Coverage: Consider also starting Novolog 3 units TID meal coverage in addition to correction due to increase inglucose in the 300's at meal times.  Thanks,  Tama Headings RN, MSN, [facility] Inpatient Diabetes Coordinator Team Pager [phone] (8a-5p)

## 2015-06-13 NOTE — Clinical Social Work Note (Signed)
CSW met with patient at bedside as the patient was admitted from [facility]. CSW assessed to determine what patient's DC plan will be. The patient states that she will return home at time of discharge. She states that she has been at [facility] for 2 months and is tired of being there. She claims that her husband will help her at home. The patient still needs to be seen by PT. CSW signing off at this time as the patient does not present with any other CSW related needs.   Liz Beach MSW, [place], [place], [phone]

## 2015-06-13 NOTE — Progress Notes (Signed)
Weaned pt to 3L [place] due to stable sats. 3L [place] is pts home reg. Pt tol well. Sats 95% on 3L [place]. Pt in no distress

## 2015-06-13 NOTE — Progress Notes (Signed)
Family Medicine Teaching Service Daily Progress Note Intern Pager: [phone]  Patient name: [name] Medical record number: [id number] Date of birth: [birth year] Age: 70 y.o. Gender: female  Primary Care Provider: Glenda Chroman, MD Consultants: None Code Status: DNR   Pt Overview and Major Events to Date:  5/5: admitted for SOB  Assessment and Plan: [name] is a 70 y.o. female presenting with shortness of breath and cough. PMH is significant for COPD, HCAP, HTN,   Dyspnea/Respiratory failure: Patient feels she is improving, requiring 5 Liters currently.  Liters of oxygen. 3 previous hospitalizations. States that she feels improved today,   reported PNA.89% once this am on 5L, otherwise sats ok. She is on baseline 3L [place] O2 at home.  - Appreciate pulm recs. - Wean oxygen as tolerated today to home 3 Liters  - s/p IV steriods x 4 days, continue Prednisone 50 mg today  - Duonebs q 4hrs + albuterol q2hrs prn  - Modified Barium swallow this afternoon  - Continue home Symbicort - Continue home Spiriva - BNP: 290.2 - ECHO pending   Multifocal PNA vs aspiration: CT scan suggesting these findings. She had 3 admissions at outside hospital in three months for pneumonia. She is from nursing facility. WBC trending down at 13.7. Blood cultures negative for growth.  - Repeat CXR on 5/8 with Persistent confluent interstitial density in the left perihilar and right infrahilar regions compatible with pneumonia - s/p vancomycin and zosyn x 3 days approximately  - ceftriaxone x 2 days - consider transition to PO Levaquin  - pulm recs, will   Anemia - Normocytic. Hgb 10.5, normal iron panel  - consider outpatient colonoscopy   DM-2: CBG . Discharged on SSI Novolog & metformin 500 mg twice a day per discharge paper from recent hospitalization. - SSI sensitive  - holding metformin while NPO  - CBG q6h while NPO, then ACHS when she starts eating  Hypertension: normotensive here. On  lisinopril 40 at home -continue home home lisinopril  Tachycardia: on diltiazem 45 mg four times a day  -Continue home diltiazem   OA and Fracture of proximal right humerus: treated with sling per ortho note from 10/21/2014. Follow up X-ray on 11/01/2015 showed impacted nondisplaced but less than 45 angulation in any fragment and less than 1 cm displacement.  -Tylenol as needed for pain - restarted oxy IR 5 mg Q12 PRN. She is on chronic narcotics for chronic back pain. Usually getting them Q4 or Q6   Anxiety/Insomnia/?Depression: Prozac 60 mg daily, Diazepam 5 mg twice a day prn , temazepam 15 mg at bedtime as needed.  -continue home prozac -started on diazepam yesterday for anxiety.   History of intermittent delirium: Zyprexa 10 mg at bedtime. -Zyprexa as needed  FEN/GI: Carb modified, saline lock  PPx: lovenox   Disposition: pending improvement   Subjective:  Patient doing well this morning, she feels that she has significantly improved.   Objective: Temp:  [97.6 F (36.4 C)-97.9 F (36.6 C)] 97.9 F (36.6 C) (05/09 0500) Pulse Rate:  [75-90] 75 (05/09 1131) Resp:  [10-24] 10 (05/09 1131) BP: (146-171)/(76-92) 171/92 mmHg (05/09 0800) SpO2:  [92 %-98 %] 93 % (05/09 1131) Weight:  [142 lb (64.411 kg)] 142 lb (64.411 kg) (05/09 0500) Physical Exam: General: Elderly female  Cardiovascular: RRR, S1S2, no edema  Respiratory: 5L O2. [place], [place] bibasilar breaths sounds, otherwise improved from yesterday's exam  Abdomen: soft, Nt ND Extremities: moves all freely   Laboratory:  Recent Labs Lab 06/10/15 0305 06/11/15 1114 06/12/15 0311 06/12/15 0959  WBC 10.3 15.2*  --  13.7*  HGB 8.9* 8.8* 9.8* 10.5*  HCT 28.3* 28.9* 32.7* 33.6*  PLT 561* 604*  --  537*    Recent Labs Lab 06/10/15 0305 06/11/15 1114 06/12/15 0959 06/13/15 0544  NA 137 135 136 137  K 4.4 4.1 3.5 3.4*  CL 92* 90* 90* 89*  CO2 36* 34* 34* 36*  BUN 11 7 7 9   CREATININE 0.80 0.83 0.88 0.71   CALCIUM 8.6* 8.7* 8.6* 8.5*  PROT 6.2*  --   --   --   BILITOT 0.3  --   --   --   ALKPHOS 53  --   --   --   ALT 14  --   --   --   AST 12*  --   --   --   GLUCOSE 174* 279* 271* 202*    Imaging/Diagnostic Tests: Dg Chest Port 1 View  06/12/2015  CLINICAL DATA:  Follow-up pneumonia, on BiPAP, history of COPD, acute and chronic respiratory failure, pneumonia. EXAM: PORTABLE CHEST 1 VIEW COMPARISON:  CT scan of the chest and portable chest x-ray of Jun 09, 2015 FINDINGS: The lungs are adequately inflated. The interstitial markings are coarse bilaterally. Confluent lung markings are noted in the left perihilar and right infrahilar regions. There is no pleural effusion or pneumothorax. The cardiac silhouette is mildly enlarged. The pulmonary vascularity is prominent centrally. The mediastinum is normal in width. The bony thorax exhibits no acute abnormality. IMPRESSION: Slight interval improvement in the appearance of the pulmonary interstitium. Persistent confluent interstitial density in the left perihilar and right infrahilar regions compatible with pneumonia. Stable cardiomegaly without pulmonary vascular congestion. Electronically Signed   By: David  Martinique M.D.   On: 06/12/2015 07:59    Rayleen Wyrick Cletis Media, MD 06/13/2015, 11:51 AM PGY-1, [place] Intern pager: [phone], text pages welcome

## 2015-06-13 NOTE — Progress Notes (Signed)
MBSS complete. Full report located under chart review in imaging section.  Caeleigh Prohaska Paiewonsky, M.A. CCC-SLP [phone]  

## 2015-06-13 NOTE — Evaluation (Signed)
Clinical/Bedside Swallow Evaluation Patient Details  Name: [name] MRN: [id number] Date of Birth: January 23, 1946  Today's Date: 06/13/2015 Time: SLP Start Time (ACUTE ONLY): 1026 SLP Stop Time (ACUTE ONLY): 1041 SLP Time Calculation (min) (ACUTE ONLY): 15 min  Past Medical History:  Past Medical History  Diagnosis Date  . GERD (gastroesophageal reflux disease)   . Allergic rhinitis   . Pneumonia   . Hypertension   . Diabetes mellitus, type 2 ([place])   . 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 Segmental colitis ([place]) 07/1996  . Duodenal ulcer   . Hiatal hernia   . Diverticulosis   . IBS (irritable bowel syndrome)   . Diverticulitis 2001  . Anxiety   . Chronic airway obstruction, not elsewhere classified   . 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 Tubulovillous adenoma of colon 1998   Past Surgical History:  Past Surgical History  Procedure Laterality Date  . Cesarean section  1971  . Abdominal hysterectomy  1980  . Colectomy  07/1999    Sigmoid-chronic diverticulitis  . Rod in left leg    . Cholecystectomy      ERCP sphincterotomy, stone extraction 2006  . Endoscopic retrograde cholangiopancreatography (ercp) with propofol  11/06/2004    RMR: Normal appearing ampulla/Mildly diffusely dilated biliary tree/ status post sphincterotomy and stone extraction as  . Esophagogastroduodenoscopy  09/1999    [place]: Gastritis, mild duodenitis  . Colonoscopy  05/1999    [place]: Multiple colonic polys (benign polypoid mucosa), very tortuous sigmoid colon  . Colonoscopy  01/13/2012    Procedure: COLONOSCOPY;  Surgeon: Daneil Dolin, MD;  Location: [facility] ENDO SUITE;  Service: Endoscopy;  Laterality: N/A;  10:30  . Cataract extraction w/phaco Left 08/02/2013    Procedure: CATARACT EXTRACTION PHACO AND INTRAOCULAR LENS PLACEMENT (IOC);  Surgeon: Williams Che, MD;  Location: [facility] ORS;  Service: Ophthalmology;  Laterality: Left;  CDE 2.57  .  Cataract extraction w/phaco Right 12/20/2013    Procedure: CATARACT EXTRACTION PHACO AND INTRAOCULAR LENS PLACEMENT; CDE: 2.56;  Surgeon: Williams Che, MD;  Location: [facility] ORS;  Service: Ophthalmology;  Laterality: Right;   HPI:  70 yo F former smoker with known COPD on 3L O2 at home who presents with SOB and recurrent PNA. Previous MBS in 2012 showed flash penetration with thin liquids. She describes difficulty with swallowing that includes solids getting stuck, regurgitation of solids, and difficulty breathing with PO intake.    Assessment / Plan / Recommendation Clinical Impression  Pt has a delayed cough x1 with straw sip of thin liquids with no other overt s/s of aspiration. She does however seem to have an increase in dyspnea across PO trials, and when asked about this she states that she will often not eat because it gives her difficulty breathing. Given the above as well as her subjective complaints, recurrent PNA, and respiratory status, MBS is warranted for further assessment of swallowing function. Will plan for this afternoon with radiology.    Aspiration Risk  Mild aspiration risk;Moderate aspiration risk    Diet Recommendation Regular;Thin liquid   Liquid Administration via: Cup;Straw Medication Administration: Whole meds with puree Supervision: Patient able to self feed;Full supervision/cueing for compensatory strategies Compensations: Slow rate;Small sips/bites;Follow solids with liquid Postural Changes: Seated upright at 90 degrees;Remain upright for at least 30 minutes after po intake    Other  Recommendations Oral Care Recommendations: Oral care BID   Follow up  Recommendations   (tba)    Frequency and Duration            Prognosis        Swallow Study   General HPI: 70 yo F former smoker with known COPD on 3L O2 at home who presents with SOB and recurrent PNA. Previous MBS in 2012 showed flash penetration with thin liquids. She describes difficulty with  swallowing that includes solids getting stuck, regurgitation of solids, and difficulty breathing with PO intake.  Type of Study: Bedside Swallow Evaluation Previous Swallow Assessment: see HPI Diet Prior to this Study: Regular;Thin liquids Temperature Spikes Noted: No Respiratory Status: Nasal cannula History of Recent Intubation: No Behavior/Cognition: Alert;Cooperative;Pleasant mood Oral Cavity Assessment: Within Functional Limits Oral Care Completed by SLP: No Oral Cavity - Dentition: Adequate natural dentition Vision: Functional for self-feeding Self-Feeding Abilities: Able to feed self Patient Positioning: Upright in bed Baseline Vocal Quality: Normal Volitional Cough: Strong    Oral/Motor/Sensory Function Overall Oral Motor/Sensory Function: Within functional limits   Ice Chips Ice chips: Not tested   Thin Liquid Thin Liquid: Impaired Presentation: Cup;Self Fed;Straw Pharyngeal  Phase Impairments: Cough - Delayed;Change in Vital Signs;Suspected delayed Swallow (x1)    Nectar Thick Nectar Thick Liquid: Not tested   Honey Thick Honey Thick Liquid: Not tested   Puree Puree: Impaired Presentation: Self Fed;Spoon Pharyngeal Phase Impairments: Change in Vital Signs   Solid   GO   Solid: Impaired Presentation: Self Fed Pharyngeal Phase Impairments: Change in Vital Signs       Germain Osgood, M.A. CCC-SLP [phone]  Germain Osgood 06/13/2015,11:00 AM

## 2015-06-13 NOTE — Care Management Note (Addendum)
Case Management Note  Patient Details  Name: [name] MRN: [id number] Date of Birth: Dec 28, 1945  Subjective/Objective:     Pt admitted for COPD-Pt is from [facility]. Pt initially wanted to return home, however is now agreeable to return to SNF.                 Action/Plan: CSW is assisting with disposition needs. DME 02 to be picked up by [facility]. CM spoke to [place] and she would have a driver to come and pick up DME. CM did speak with husband and he will pick up Shower chair. No Further needs from CM at this time.    Expected Discharge Date:                  Expected Discharge Plan:  Skilled Nursing Facility  In-House Referral:  Clinical Social Work  Discharge planning Services  CM Consult  Post Acute Care Choice:    N/A Choice offered to:   N/A  DME Arranged:   N/A DME Agency:   N/A  HH Arranged:   N/A HH Agency:   N/A  Status of Service: Completed.  Medicare Important Message Given:  Yes Date Medicare IM Given:    Medicare IM give by:    Date Additional Medicare IM Given:    Additional Medicare Important Message give by:     If discussed at [place] of Stay Meetings, dates discussed:    Additional Comments:  [place], Nemet, RN 06/13/2015, 4:28 PM

## 2015-06-13 NOTE — Progress Notes (Signed)
Patient has refused CPAP for tonight. She does not wear CPAP or BIPAP at home. She says she wants to try to wear nasal cannula tonight. Patient in no distress. Patient said she would call if she changed her mind.

## 2015-06-13 NOTE — Progress Notes (Signed)
Echocardiogram 2D Echocardiogram has been performed.  [name] 06/13/2015, 12:28 PM

## 2015-06-13 NOTE — Progress Notes (Signed)
Name: [name] MRN: [id number] DOB: 1945/07/12    ADMISSION DATE:  06/09/2015 CONSULTATION DATE:  06/10/15  REFERRING MD : Greig Right  CHIEF COMPLAINT: short of breath  BRIEF PATIENT DESCRIPTION: 26yoF former smoker with known COPD on 3L O2 at home. No current pulmonary f/u.  Describes recurrent shortness of breath over past 2 months, seeming to respond to antibiotics/ steroids. Initially hosp at [facility], then [facility] and now here with interim rehab SNF. Being treated for presumed pneumonia w AECOPD. Required BIPAP for CO2 retention with hypoxia overnight, but says she can't tell it helped. PCCM asked to review and assist. Initial PH 7.30/pCO2 81.8/ pO2 138/  HCO3 41.  CT chest shows emphysema and upper zone diffuse ground glass, very similar to 11/08/04 on review.  Recent CXRs- hypoventilation, hazy infiltrates.  SIGNIFICANT EVENTS  5/05  Admit with SOB  STUDIES:  CT chest w/o 06/09/15 >> ABG on 6L o2 5/6 >> 7.42/ pO2 70.5/ pCO2 59.9/ HCO3 37.4   SUBJECTIVE:  Pt reports feeling better.  Did not wear bipap last pm.  No distress.  Pending SLP evaluation.    VITAL SIGNS: Temp:  [97.6 F (36.4 C)-97.9 F (36.6 C)] 97.9 F (36.6 C) (05/09 0500) Pulse Rate:  [75-90] 75 (05/09 1131) Resp:  [10-24] 10 (05/09 1131) BP: (146-171)/(76-92) 171/92 mmHg (05/09 0800) SpO2:  [92 %-98 %] 93 % (05/09 1131) Weight:  [142 lb (64.411 kg)] 142 lb (64.411 kg) (05/09 0500)  PHYSICAL EXAMINATION:   General: Obese female, awake, elderly woman, NAD Neuro:  Oriented, speech clear, moves extrems x 4, non-focal HEENT:  Speech clear, no stridor, no JVD, grossly nl vision and hearing Cardiovascular:  RRR, no m/g/r, no peripheral edema Lungs: unlabored on nasal 3LNC, lungs bilaterally diminished, basilar crackles Abdomen:  Obese, soft, non-tender, BS present Musculoskeletal:  No acute deformities, generalized weakness Skin:  No rash, tatoo L ankle   Recent Labs Lab 06/11/15 1114 06/12/15 0959  06/13/15 0544  NA 135 136 137  K 4.1 3.5 3.4*  CL 90* 90* 89*  CO2 34* 34* 36*  BUN 7 7 9   CREATININE 0.83 0.88 0.71  GLUCOSE 279* 271* 202*    Recent Labs Lab 06/10/15 0305 06/11/15 1114 06/12/15 0311 06/12/15 0959  HGB 8.9* 8.8* 9.8* 10.5*  HCT 28.3* 28.9* 32.7* 33.6*  WBC 10.3 15.2*  --  13.7*  PLT 561* 604*  --  537*   Dg Chest Port 1 View  06/12/2015  CLINICAL DATA:  Follow-up pneumonia, on BiPAP, history of COPD, acute and chronic respiratory failure, pneumonia. EXAM: PORTABLE CHEST 1 VIEW COMPARISON:  CT scan of the chest and portable chest x-ray of Jun 09, 2015 FINDINGS: The lungs are adequately inflated. The interstitial markings are coarse bilaterally. Confluent lung markings are noted in the left perihilar and right infrahilar regions. There is no pleural effusion or pneumothorax. The cardiac silhouette is mildly enlarged. The pulmonary vascularity is prominent centrally. The mediastinum is normal in width. The bony thorax exhibits no acute abnormality. IMPRESSION: Slight interval improvement in the appearance of the pulmonary interstitium. Persistent confluent interstitial density in the left perihilar and right infrahilar regions compatible with pneumonia. Stable cardiomegaly without pulmonary vascular congestion. Electronically Signed   By: David  Martinique M.D.   On: 06/12/2015 07:59    ASSESSMENT / PLAN:   Multilobar Pneumonia - Suspect recurrent aspiration and viral infections over last several months. Upper zone predominance not typical for aspiration, but daughter never told of specific culture identifications  elsewhere and repetitive nature concerning for aspiration. Would like to attempt better ventilation with mobilization of secretions for culture. Interesting that CT from years ago showed similar upper zone ground glass density.  She also had a SLP evaluation with flash penetration of liquids.  P: Culture negative thus far Narrow abx to rocephin > no specific  organisms thus far, no WBC Sputum cx and nasal viral swab ordered Intermittent CXR Pulmonary hygiene  SLP evaluation for swallowing difficulties, concern for recurrent aspiration.  Pending MBS.    Respiratory Failure - A on C, hypoxic and hypercapneic. Denies diagnosed OSA, but probable obesity-hypoventilation. Sedatives and supplemental O2 need to be used conservatively. Key will be mobilization to improve lung expansion P: Mobilize early and often, up in chair, PT/OT Incentive spirometry ordered Goal O2 sat 89-92% BIPAP per RT PRN for increased WOB, QHS Elevate head of bed   COPD mixed type - with acute exacerbation P:  Continue bronchodilators, steroids   Anemia - unspecified, likely chronic disease P:  Recommend basic eval incl stool for OB.  Defer work up to primary SVC.    GLOBAL:  DNR  Noe Gens, NP-C [facility] Pgr: [phone] or if no answer [phone] 06/13/2015, 11:32 AM   STAFF NOTE: I, Merrie Roof, MD FACP have personally reviewed patient's available data, including medical history, events of note, physical examination and test results as part of my evaluation. I have discussed with resident/NP and other care providers such as pharmacist, RN and RRT. In addition, I personally evaluated patient and elicited key findings of: neg 3 liters, less coarse, no distress, pcxr last done compared to prior showing improving apical changes, concern remains for aspiration noted swallow evaluation results and past history, complete swallow objective trials, as she has been culture neg, we can narrow off abx that cover pseudo or mrsa to ceftriaxone, it is Okay to use oral monotherapy agent, consider Augmentin, would give total duration 7-8 days abx , echo result reviewed and has tolerated neg balance well  Lavon Paganini. Titus Mould, MD, [place] Pgr: [facility] 06/13/2015 3:37 PM

## 2015-06-14 LAB — BASIC METABOLIC PANEL
ANION GAP: 13 (ref 5–15)
BUN: 9 mg/dL (ref 6–20)
CALCIUM: 8.7 mg/dL — AB (ref 8.9–10.3)
CO2: 35 mmol/L — AB (ref 22–32)
CREATININE: 0.75 mg/dL (ref 0.44–1.00)
Chloride: 88 mmol/L — ABNORMAL LOW (ref 101–111)
GFR calc Af Amer: >60 mL/min (ref >60)
GFR calc non Af Amer: >60 mL/min (ref >60)
GLUCOSE: 360 mg/dL — AB (ref 65–99)
Potassium: 3.6 mmol/L (ref 3.5–5.1)
Sodium: 136 mmol/L (ref 135–145)

## 2015-06-14 LAB — BLOOD CULTURE ID PANEL (REFLEXED)
Acinetobacter baumannii: NOT DETECTED
CANDIDA ALBICANS: NOT DETECTED
CANDIDA PARAPSILOSIS: NOT DETECTED
Candida glabrata: NOT DETECTED
Candida krusei: NOT DETECTED
Candida tropicalis: NOT DETECTED
Carbapenem resistance: NOT DETECTED
ENTEROBACTER CLOACAE COMPLEX: NOT DETECTED
ENTEROBACTERIACEAE SPECIES: NOT DETECTED
ENTEROCOCCUS SPECIES: NOT DETECTED
Escherichia coli: NOT DETECTED
Haemophilus influenzae: NOT DETECTED
KLEBSIELLA PNEUMONIAE: NOT DETECTED
Klebsiella oxytoca: NOT DETECTED
Listeria monocytogenes: NOT DETECTED
METHICILLIN RESISTANCE: NOT DETECTED
Neisseria meningitidis: NOT DETECTED
PSEUDOMONAS AERUGINOSA: NOT DETECTED
Proteus species: NOT DETECTED
STAPHYLOCOCCUS AUREUS BCID: NOT DETECTED
STREPTOCOCCUS AGALACTIAE: NOT DETECTED
STREPTOCOCCUS PNEUMONIAE: NOT DETECTED
Serratia marcescens: NOT DETECTED
Staphylococcus species: NOT DETECTED
Streptococcus pyogenes: NOT DETECTED
Streptococcus species: NOT DETECTED
VANCOMYCIN RESISTANCE: NOT DETECTED

## 2015-06-14 LAB — CBC
HCT: 37.4 % (ref 36.0–46.0)
HEMOGLOBIN: 11.5 g/dL — AB (ref 12.0–15.0)
MCH: 27.8 pg (ref 26.0–34.0)
MCHC: 30.7 g/dL (ref 30.0–36.0)
MCV: 90.3 fL (ref 78.0–100.0)
Platelets: 534 10*3/uL — ABNORMAL HIGH (ref 150–400)
RBC: 4.14 MIL/uL (ref 3.87–5.11)
RDW: 15.4 % (ref 11.5–15.5)
WBC: 15.4 10*3/uL — ABNORMAL HIGH (ref 4.0–10.5)

## 2015-06-14 LAB — CULTURE, BLOOD (ROUTINE X 2): Culture: NO GROWTH

## 2015-06-14 LAB — GLUCOSE, CAPILLARY
GLUCOSE-CAPILLARY: 155 mg/dL — AB (ref 65–99)
GLUCOSE-CAPILLARY: 263 mg/dL — AB (ref 65–99)
Glucose-Capillary: 389 mg/dL — ABNORMAL HIGH (ref 65–99)
Glucose-Capillary: 191 mg/dL — ABNORMAL HIGH (ref 65–99)

## 2015-06-14 MED ORDER — IPRATROPIUM-ALBUTEROL 0.5-2.5 (3) MG/3ML IN SOLN: 3.0000 mL | Freq: Four times a day (QID) | RESPIRATORY_TRACT | Status: DC

## 2015-06-14 MED ORDER — ENSURE ENLIVE PO LIQD
237.0000 mL | Freq: Two times a day (BID) | ORAL | Status: DC
  Administered 2015-06-14 – 2015-06-15 (×3): 237 mL via ORAL

## 2015-06-14 MED ORDER — RESOURCE THICKENUP CLEAR PO POWD
ORAL | Status: DC | PRN
  Filled 2015-06-14: qty 125

## 2015-06-14 MED ORDER — IPRATROPIUM-ALBUTEROL 0.5-2.5 (3) MG/3ML IN SOLN
3.0000 mL | RESPIRATORY_TRACT | Status: DC | PRN
  Administered 2015-06-14 – 2015-06-16 (×7): 3 mL via RESPIRATORY_TRACT
  Filled 2015-06-14 (×7): qty 3

## 2015-06-14 NOTE — Evaluation (Signed)
Occupational Therapy Evaluation Patient Details Name: [name] MRN: [id number] DOB: [birth year] Today's Date: 06/14/2015    History of Present Illness Pt admitted with SOB/cough with multilobal PNA from [facility] where she was receiving rehab. PMH: COPD, HCAP, HTN, anemia, DM, tachycardia, R humeral fx, delirium. Pt has been from [facility] to [facility] to [employer] and desires to go home with her husband.   Clinical Impression   Pt was walking with a RW and assistance short distances at SNF, she was assisted for bathing and dressing, but participated as much as she could. Pt presents with decreased activity tolerance with 02 sats down to 80% on 6L 02 with ambulation. Instructed in breathing techniques and to avoid breath holding with exertion. Pt requiring min guard assist for mobility and min assist for self care. Recommending home with assist of her husband. Pt states she has Medicaid and would like to have personal care services at home in addition to home health therapies. Will follow acutely.    Follow Up Recommendations  Home health OT;Supervision/Assistance - 24 hour (home health aide)    Equipment Recommendations  3 in 1 bedside comode (4 wheel walker)    Recommendations for Other Services       Precautions / Restrictions Precautions Precautions: Fall Precaution Comments: watch sats Restrictions Weight Bearing Restrictions: No      Mobility Bed Mobility Overal bed mobility: Needs Assistance Bed Mobility: Supine to Sit     Supine to sit: Supervision;HOB elevated     General bed mobility comments: increased time, no physical assist  Transfers Overall transfer level: Needs assistance Equipment used: Rolling walker (2 wheeled) Transfers: Sit to/from [facility] Sit to Stand: Min guard Stand pivot transfers: Min guard       General transfer comment: verbal cues for hand placement, min guard assist for safety, assist to  manage 02 line    Balance Overall balance assessment: Needs assistance   Sitting balance-Leahy Scale: Good       Standing balance-Leahy Scale: Poor                              ADL Overall ADL's : Needs assistance/impaired Eating/Feeding: Independent;Bed level   Grooming: Wash/dry hands;Wash/dry face;Sitting;Set up;Brushing hair;Minimal assistance Grooming Details (indicate cue type and reason): assisted to comb back of head due to shoulder limitations Upper Body Bathing: Minimal assitance;Sitting   Lower Body Bathing: Minimal assistance;Sit to/from stand   Upper Body Dressing : Set up;Sitting   Lower Body Dressing: Minimal assistance;Sit to/from stand Lower Body Dressing Details (indicate cue type and reason): able to don socks crossing foot over opposite knee Toilet Transfer: Min guard;Ambulation;RW;BSC   Toileting- Clothing Manipulation and Hygiene: Maximal assistance;Sit to/from stand       Functional mobility during ADLs: Min guard;Rolling walker (to door and back) General ADL Comments: Pt with 02 sats dropping to 80% with ambulation on 6L.     Vision     Perception     Praxis      Pertinent Vitals/Pain Pain Assessment: No/denies pain     Hand Dominance Right   Extremity/Trunk Assessment Upper Extremity Assessment Upper Extremity Assessment: RUE deficits/detail RUE Deficits / Details: longstanding shoulder limitations from previous fall with fx RUE Coordination: decreased gross motor   Lower Extremity Assessment Lower Extremity Assessment: Defer to PT evaluation       Communication Communication Communication: No difficulties   Cognition Arousal/Alertness: Awake/alert Behavior During  Therapy: WFL for tasks assessed/performed Overall Cognitive Status: Within Functional Limits for tasks assessed                     General Comments       Exercises       Shoulder Instructions      Home Living Family/patient expects to  be discharged to:: Private residence Living Arrangements: Spouse/significant other Available Help at Discharge: Family;Available 24 hours/day Type of Home: House Home Access: Stairs to enter [employer] of Steps: 5 Entrance Stairs-Rails: Left Home Layout: Two level;Able to live on main level with bedroom/bathroom     Bathroom Shower/Tub: Tub/shower unit;Walk-in shower   Bathroom Toilet: Standard     Home Equipment: Other (comment);Walker - 4 wheels (02)          Prior Functioning/Environment Level of Independence: Needs assistance  Gait / Transfers Assistance Needed: was walking 20 ft in rehab with RW ADL's / Homemaking Assistance Needed: assisted for bathing and dressing        OT Diagnosis: Generalized weakness   OT Problem List: Decreased strength;Decreased activity tolerance;Impaired balance (sitting and/or standing);Decreased knowledge of use of DME or AE;Cardiopulmonary status limiting activity;Decreased coordination;Decreased range of motion   OT Treatment/Interventions: Self-care/ADL training;Energy conservation;DME and/or AE instruction;Therapeutic activities;Patient/family education;Balance training    OT Goals(Current goals can be found in the care plan section) Acute Rehab OT Goals Patient Stated Goal: go home  OT Goal Formulation: With patient Time For Goal Achievement: 06/28/15 Potential to Achieve Goals: Good ADL Goals Pt Will Perform Grooming: with supervision;standing (2 activities) Pt Will Perform Upper Body Bathing: with supervision;with adaptive equipment (long bath sponge for back/feet) Pt Will Perform Lower Body Bathing: with supervision;sit to/from stand Pt Will Perform Lower Body Dressing: with supervision;sit to/from stand Pt Will Transfer to Toilet: with supervision;ambulating;bedside commode (over toilet) Pt Will Perform Toileting - Clothing Manipulation and hygiene: with supervision;sit to/from stand Additional ADL Goal #1: Pt will  utilize pursed lip breathing techniques with verbal cues during exertion.  OT Frequency: Min 2X/week   Barriers to D/C:            Co-evaluation PT/OT/SLP Co-Evaluation/Treatment: Yes Reason for Co-Treatment: For patient/therapist safety   OT goals addressed during session: ADL's and self-care      End of Session Equipment Utilized During Treatment: Gait belt;Rolling walker;Oxygen  Activity Tolerance: Patient limited by fatigue Patient left: in chair;with call bell/phone within reach   Time: [id number] OT Time Calculation (min): 36 min Charges:  OT General Charges $OT Visit: 1 Procedure OT Evaluation $OT Eval Moderate Complexity: 1 Procedure G-Codes:    Malka So 06/14/2015, 10:23 AM [phone]

## 2015-06-14 NOTE — Evaluation (Signed)
Physical Therapy Evaluation Patient Details Name: [name] MRN: [id number] DOB: December 06, 1945 Today's Date: 06/14/2015   History of Present Illness  Pt admitted with SOB/cough with multilobal PNA from [facility] where she was receiving rehab. PMH: COPD, HCAP, HTN, anemia, DM, tachycardia, R humeral fx, delirium. Pt has been from [facility] to [facility] to [employer] and desires to go home with her husband.  Clinical Impression  Patient demonstrates deficits in functional mobility as indicated below. Will need continued skilled PT to address deficits and maximize function. Will see as indicated and progress as tolerated. OF NOTE: patient ambulated on 5 liters [place] with saturations dropping to 89%, improved with cues for pursed lip breathing and rest, 95% at rest.     Follow Up Recommendations Home health PT;Supervision/Assistance - 24 hour    Equipment Recommendations  3in1 (PT);Other (comment) (4 wheels rolator walker with seat)    Recommendations for Other Services Rehab consult     Precautions / Restrictions Precautions Precautions: Fall Precaution Comments: watch sats Restrictions Weight Bearing Restrictions: No      Mobility  Bed Mobility Overal bed mobility: Needs Assistance Bed Mobility: Supine to Sit     Supine to sit: Supervision;HOB elevated     General bed mobility comments: increased time, no physical assist  Transfers Overall transfer level: Needs assistance Equipment used: Rolling walker (2 wheeled) Transfers: Sit to/from [facility] Sit to Stand: Min guard Stand pivot transfers: Min guard       General transfer comment: verbal cues for hand placement, min guard assist for safety, assist to manage 02 line  Ambulation/Gait Ambulation/Gait assistance: Min guard Ambulation Distance (Feet): 36 Feet Assistive device: Rolling walker (2 wheeled) Gait Pattern/deviations: Step-through pattern;Decreased stride length Gait  velocity: decreased   General Gait Details: slow, controlled gait, good stability. VCs for pursed lib breathing and sequencing during ambulation (ambualted on 5 liters [place])  Stairs            Wheelchair Mobility    Modified Rankin (Stroke Patients Only)       Balance     Sitting balance-Leahy Scale: Good       Standing balance-Leahy Scale: Poor                               Pertinent Vitals/Pain Pain Assessment: No/denies pain    Home Living Family/patient expects to be discharged to:: Private residence Living Arrangements: Spouse/significant other Available Help at Discharge: Family;Available 24 hours/day Type of Home: House Home Access: Stairs to enter Entrance Stairs-Rails: Left Entrance Stairs-Number of Steps: 5 Home Layout: Two level;Able to live on main level with bedroom/bathroom Home Equipment: Other (comment);Walker - 4 wheels (02)      Prior Function Level of Independence: Needs assistance   Gait / Transfers Assistance Needed: was walking 20 ft in rehab with RW  ADL's / Homemaking Assistance Needed: assisted for bathing and dressing        Hand Dominance   Dominant Hand: Right    Extremity/Trunk Assessment   Upper Extremity Assessment: RUE deficits/detail RUE Deficits / Details: longstanding shoulder limitations from previous fall with fx         Lower Extremity Assessment: Generalized weakness         Communication   Communication: No difficulties  Cognition Arousal/Alertness: Awake/alert Behavior During Therapy: WFL for tasks assessed/performed Overall Cognitive Status: Within Functional Limits for tasks assessed  General Comments      Exercises        Assessment/Plan    PT Assessment Patient needs continued PT services  PT Diagnosis Difficulty walking;Abnormality of gait;Generalized weakness   PT Problem List Decreased strength;Decreased activity tolerance;Decreased  mobility;Cardiopulmonary status limiting activity  PT Treatment Interventions DME instruction;Gait training;Stair training;Functional mobility training;Therapeutic activities;Therapeutic exercise;Balance training;Patient/family education   PT Goals (Current goals can be found in the Care Plan section) Acute Rehab PT Goals Patient Stated Goal: go home  PT Goal Formulation: With patient Time For Goal Achievement: 06/28/15 Potential to Achieve Goals: Good    Frequency Min 3X/week   Barriers to discharge Inaccessible home environment stairs to enter the home    Co-evaluation               End of Session Equipment Utilized During Treatment: Gait belt;Oxygen Activity Tolerance: Patient limited by fatigue Patient left: in chair;with call bell/phone within reach Nurse Communication: Mobility status         Time: 0902-0920 PT Time Calculation (min) (ACUTE ONLY): 18 min   Charges:   PT Evaluation $PT Eval Moderate Complexity: 1 Procedure     PT G CodesDuncan Dull 06-16-15, 3:59 PM Alben Deeds, [place] DPT  [phone]

## 2015-06-14 NOTE — Progress Notes (Signed)
Speech Language Pathology Treatment: Dysphagia  Patient Details Name: MEKIA MCCREADY MRN: [id number] DOB: 10-06-1945 Today's Date: 06/14/2015 Time: [id number] SLP Time Calculation (min) (ACUTE ONLY): 16 min  Assessment / Plan / Recommendation Clinical Impression  Pt with water and ice pitcher at her bedside upon SLP arrival. SLP removed thin liquids from her bedside and replaced with with nectar-thick liquid. Demonstration for how to use thickener provided and education reinforced from [facility] on previous date. Pt consumed nectar thick liquids via straw with rapid rate, resulting in cough x1. Aspiration precautions and swallowing recommendations reviewed. Will continue to monitor.   HPI HPI: 70 yo F former smoker with known COPD on 3L O2 at home who presents with SOB and recurrent PNA. Previous MBS in 2012 showed flash penetration with thin liquids. She describes difficulty with swallowing that includes solids getting stuck, regurgitation of solids, and difficulty breathing with PO intake.       SLP Plan  Continue with current plan of care     Recommendations  Diet recommendations: Dysphagia 3 (mechanical soft);Nectar-thick liquid Liquids provided via: Cup;Straw Medication Administration: Whole meds with puree Supervision: Patient able to self feed;Intermittent supervision to cue for compensatory strategies Compensations: Slow rate;Small sips/bites;Follow solids with liquid Postural Changes and/or Swallow Maneuvers: Seated upright 90 degrees;Upright 30-60 min after meal             Oral Care Recommendations: Oral care BID Follow up Recommendations: Home health SLP Plan: Continue with current plan of care     GO               Germain Osgood, M.A. CCC-SLP [phone]  Germain Osgood 06/14/2015, 4:35 PM

## 2015-06-14 NOTE — Progress Notes (Signed)
Family Medicine Teaching Service Daily Progress Note Intern Pager: [phone]  Patient name: [name] Medical record number: [id number] Date of birth: [birth year] Age: 70 y.o. Gender: female  Primary Care Provider: Glenda Chroman, MD Consultants: None Code Status: DNR   Pt Overview and Major Events to Date:  5/5: admitted for SOB  Assessment and Plan: [name] is a 70 y.o. female presenting with shortness of breath and cough. PMH is significant for COPD, HCAP, HTN,   Dyspnea/Respiratory failure: Patient feels that she is back to baseline. Currently on 4 Liters. Patient weaned to 3 Liters yesterday, with sats in the 70-80s. 3 past hospitalization   reported PNA.  She is on baseline 3L [place] O2 at home. ECHO, 55-50% EF, grade 1 diastolic dysfunction, PA peak pressure 57 mmHg - Appreciate pulm recs. - Wean oxygen as tolerated - s/p IV steriods x 4 days, continue Prednisone 50 mg 2 days  - d/c today  - Duonebs q4hrs prn  - Modified Barium swallow - shows small aspiration, needs dysphagia 3 diet  - Continue home Symbicort - Continue home Spiriva  Multifocal PNA vs aspiration: CT scan suggesting these findings. She had 3 admissions at outside hospital in three months for pneumonia. She is from nursing facility. WBC trending down at 13.7. Blood cultures negative for growth.  Repeat CXR on 5/8 with Persistent confluent interstitial density in the left perihilar and right infrahilar regions compatible with pneumonia - s/p vancomycin and zosyn x 3 days approximately  - ceftriaxone x 2 days  - Continue Augmentin for 7 days per pulm  Anemia - Normocytic. Hgb 10.5, normal iron panel  - consider outpatient colonoscopy   DM-2: CBG . Discharged on SSI Novolog & metformin 500 mg twice a day per discharge paper from recent hospitalization. - SSI sensitive  - holding metformin while NPO  - CBG q6h while NPO, then ACHS when she starts eating  Hypertension: normotensive here. On lisinopril 40  at home -continue home home lisinopril  Tachycardia: on diltiazem 45 mg four times a day  -Continue home diltiazem   OA and Fracture of proximal right humerus: treated with sling per ortho note from 10/21/2014. Follow up X-ray on 11/01/2015 showed impacted nondisplaced but less than 45 angulation in any fragment and less than 1 cm displacement.  -Tylenol as needed for pain - restarted oxy IR 5 mg Q12 PRN. She is on chronic narcotics for chronic back pain. Usually getting them Q4 or Q6   Anxiety/Insomnia/?Depression: Prozac 60 mg daily, Diazepam 5 mg twice a day prn , temazepam 15 mg at bedtime as needed.  -continue home prozac -started on diazepam yesterday for anxiety.   History of intermittent delirium: Zyprexa 10 mg at bedtime. -Zyprexa as needed  FEN/GI: Dysphagia 3 diet, saline lock  PPx: lovenox   Disposition: Home tomorrow   Subjective:  Continues to improve per patient. Wants to go home, husband to help patient at home   Objective: Temp:  [97.5 F (36.4 C)-98.3 F (36.8 C)] 97.5 F (36.4 C) (05/10 0400) Pulse Rate:  [69-90] 76 (05/10 0420) Resp:  [10-24] 23 (05/10 0420) BP: (134-171)/(75-92) 151/79 mmHg (05/10 0420) SpO2:  [90 %-98 %] 92 % (05/10 0420) Weight:  [145 lb 11.2 oz (66.089 kg)] 145 lb 11.2 oz (66.089 kg) (05/10 0400) Physical Exam: General: Elderly female  Cardiovascular: RRR, S1S2, no edema  Respiratory: 4 L O2. [place], Diminshed bibasilar breaths sounds, no increased WOB Abdomen: soft, Nt ND   Laboratory:  Recent Labs Lab 06/10/15 0305 06/11/15 1114 06/12/15 0311 06/12/15 0959  WBC 10.3 15.2*  --  13.7*  HGB 8.9* 8.8* 9.8* 10.5*  HCT 28.3* 28.9* 32.7* 33.6*  PLT 561* 604*  --  537*    Recent Labs Lab 06/10/15 0305 06/11/15 1114 06/12/15 0959 06/13/15 0544  NA 137 135 136 137  K 4.4 4.1 3.5 3.4*  CL 92* 90* 90* 89*  CO2 36* 34* 34* 36*  BUN 11 7 7 9   CREATININE 0.80 0.83 0.88 0.71  CALCIUM 8.6* 8.7* 8.6* 8.5*  PROT 6.2*  --   --    --   BILITOT 0.3  --   --   --   ALKPHOS 53  --   --   --   ALT 14  --   --   --   AST 12*  --   --   --   GLUCOSE 174* 279* 271* 202*    Imaging/Diagnostic Tests: Dg Swallowing Func-speech Pathology  06/13/2015  Objective Swallowing Evaluation: Type of Study: MBS-Modified Barium Swallow Study Patient Details Name: [name] MRN: [id number] Date of Birth: 1945-05-15 Today's Date: 06/13/2015 Time: SLP Start Time (ACUTE ONLY): 1415-SLP Stop Time (ACUTE ONLY): 1433 SLP Time Calculation (min) (ACUTE ONLY): 18 min Past Medical History: Past Medical History Diagnosis Date . GERD (gastroesophageal reflux disease)  . Allergic rhinitis  . Pneumonia  . Hypertension  . Diabetes mellitus, type 2 ([place])  . Adenomatous colon polyp 07/1996   Tubulovillous adenoma . Segmental colitis ([employer]) 07/1996 . Duodenal ulcer  . Hiatal hernia  . Diverticulosis  . IBS (irritable bowel syndrome)  . Diverticulitis 2001 . Anxiety  . Chronic airway obstruction, not elsewhere classified  . 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 Tubulovillous adenoma of colon 1998 Past Surgical History: Past Surgical History Procedure Laterality Date . Cesarean section  1971 . Abdominal hysterectomy  1980 . Colectomy  07/1999   Sigmoid-chronic diverticulitis . Rod in left leg   . Cholecystectomy     ERCP sphincterotomy, stone extraction 2006 . Endoscopic retrograde cholangiopancreatography (ercp) with propofol  11/06/2004   RMR: Normal appearing ampulla/Mildly diffusely dilated biliary tree/ status post sphincterotomy and stone extraction as . Esophagogastroduodenoscopy  09/1999   [place]: Gastritis, mild duodenitis . Colonoscopy  05/1999   [place]: Multiple colonic polys (benign polypoid mucosa), very tortuous sigmoid colon . Colonoscopy  01/13/2012   Procedure: COLONOSCOPY;  Surgeon: Daneil Dolin, MD;  Location: [facility];  Service: Endoscopy;  Laterality: N/A;  10:30 . Cataract extraction w/phaco Left 08/02/2013   Procedure:  CATARACT EXTRACTION PHACO AND INTRAOCULAR LENS PLACEMENT (IOC);  Surgeon: Williams Che, MD;  Location: [facility];  Service: Ophthalmology;  Laterality: Left;  CDE 2.57 . Cataract extraction w/phaco Right 12/20/2013   Procedure: CATARACT EXTRACTION PHACO AND INTRAOCULAR LENS PLACEMENT; CDE: 2.56;  Surgeon: Williams Che, MD;  Location: [facility];  Service: Ophthalmology;  Laterality: Right; HPI: 70 yo F former smoker with known COPD on 3L O2 at home who presents with SOB and recurrent PNA. Previous MBS in 2012 showed flash penetration with thin liquids. She describes difficulty with swallowing that includes solids getting stuck, regurgitation of solids, and difficulty breathing with PO intake.  Subjective: pt alert, describes difficulty swallowing as outlined in history Assessment / Plan / Recommendation CHL IP CLINICAL IMPRESSIONS 06/13/2015 Therapy Diagnosis Mild pharyngeal phase dysphagia Clinical Impression Pt has a mild pharyngeal dysphagia that is likely respiratory based,  with mild delay in swallow and incomplete airway closure resulting in small amounts of frank, silent penetration with thin liquids. While it is only a small amount at a time, it happens consistently with each swallow despite attempted maneuvers to prevent it. Over time this has likely been leading to aspiration, although no barium was observed to pass below the vocal cords during this test. No frank penetration was observed with nectar thick liquids or solids. Particularly in light of pt's recurrent PNA, recommend Dys 3 diet and nectar thick liquids. Will f/u for tolerance and education. Impact on safety and function Mild aspiration risk   CHL IP TREATMENT RECOMMENDATION 06/13/2015 Treatment Recommendations Therapy as outlined in treatment plan below   Prognosis 06/13/2015 Prognosis for Safe Diet Advancement Fair Barriers to Reach Goals Other (Comment) Barriers/Prognosis Comment -- CHL IP DIET RECOMMENDATION 06/13/2015 SLP Diet Recommendations  Dysphagia 3 (Mech soft) solids;Nectar thick liquid Liquid Administration via Cup;Straw Medication Administration Whole meds with puree Compensations Slow rate;Small sips/bites;Follow solids with liquid Postural Changes Remain semi-upright after after feeds/meals (Comment);Seated upright at 90 degrees   CHL IP OTHER RECOMMENDATIONS 06/13/2015 Recommended Consults -- Oral Care Recommendations Oral care BID Other Recommendations Order thickener from pharmacy;Prohibited food (jello, ice cream, thin soups);Remove water pitcher   CHL IP FOLLOW UP RECOMMENDATIONS 06/13/2015 Follow up Recommendations Home health SLP   CHL IP FREQUENCY AND DURATION 06/13/2015 Speech Therapy Frequency (ACUTE ONLY) min 2x/week Treatment Duration 2 weeks      CHL IP ORAL PHASE 06/13/2015 Oral Phase WFL Oral - Pudding Teaspoon -- Oral - Pudding Cup -- Oral - Honey Teaspoon -- Oral - Honey Cup -- Oral - Nectar Teaspoon -- Oral - Nectar Cup -- Oral - Nectar Straw -- Oral - Thin Teaspoon -- Oral - Thin Cup -- Oral - Thin Straw -- Oral - Puree -- Oral - Mech Soft -- Oral - Regular -- Oral - Multi-Consistency -- Oral - Pill -- Oral Phase - Comment --  CHL IP PHARYNGEAL PHASE 06/13/2015 Pharyngeal Phase Impaired Pharyngeal- Pudding Teaspoon -- Pharyngeal -- Pharyngeal- Pudding Cup -- Pharyngeal -- Pharyngeal- Honey Teaspoon -- Pharyngeal -- Pharyngeal- Honey Cup -- Pharyngeal -- Pharyngeal- Nectar Teaspoon -- Pharyngeal -- Pharyngeal- Nectar Cup Delayed swallow initiation-vallecula;Penetration/Aspiration during swallow Pharyngeal Material enters airway, remains ABOVE vocal cords then ejected out Pharyngeal- Nectar Straw Delayed swallow initiation-vallecula;Penetration/Aspiration during swallow Pharyngeal Material enters airway, remains ABOVE vocal cords then ejected out Pharyngeal- Thin Teaspoon -- Pharyngeal -- Pharyngeal- Thin Cup Delayed swallow initiation-pyriform sinuses;Penetration/Aspiration during swallow Pharyngeal Material enters airway, remains ABOVE  vocal cords and not ejected out Pharyngeal- Thin Straw Delayed swallow initiation-pyriform sinuses;Penetration/Aspiration during swallow Pharyngeal Material enters airway, remains ABOVE vocal cords and not ejected out Pharyngeal- Puree Delayed swallow initiation-vallecula Pharyngeal -- Pharyngeal- Mechanical Soft Delayed swallow initiation-vallecula Pharyngeal -- Pharyngeal- Regular -- Pharyngeal -- Pharyngeal- Multi-consistency -- Pharyngeal -- Pharyngeal- Pill -- Pharyngeal -- Pharyngeal Comment --  CHL IP CERVICAL ESOPHAGEAL PHASE 06/13/2015 Cervical Esophageal Phase WFL Pudding Teaspoon -- Pudding Cup -- Honey Teaspoon -- Honey Cup -- Nectar Teaspoon -- Nectar Cup -- Nectar Straw -- Thin Teaspoon -- Thin Cup -- Thin Straw -- Puree -- Mechanical Soft -- Regular -- Multi-consistency -- Pill -- Cervical Esophageal Comment -- No flowsheet data found. Germain Osgood, M.A. CCC-SLP [phone] Germain Osgood 06/13/2015, 3:57 PM               Aamari West C[employer]is [employer], MD 06/14/2015, 7:24 AM PGY-1, [place] Intern pager: [phone], text pages welcome

## 2015-06-14 NOTE — Progress Notes (Signed)
Name: [name] MRN: [id number] DOB: 1945-08-26    ADMISSION DATE:  06/09/2015 CONSULTATION DATE:  06/10/15  REFERRING MD : [name]  CHIEF COMPLAINT: short of breath  BRIEF PATIENT DESCRIPTION: 70yoF former smoker with known COPD on 3L O2 at home. No current pulmonary f/u.  Describes recurrent shortness of breath over past 2 months, seeming to respond to antibiotics/ steroids. Initially hosp at [facility], then [facility] and now here with interim rehab SNF. Being treated for presumed pneumonia w AECOPD. Required BIPAP for CO2 retention with hypoxia overnight, but says she can't tell it helped. PCCM asked to review and assist. Initial PH 7.30/pCO2 81.8/ pO2 138/  HCO3 41.  CT chest shows emphysema and upper zone diffuse ground glass, very similar to 11/08/04 on review.  Recent CXRs- hypoventilation, hazy infiltrates.  SIGNIFICANT EVENTS  5/05  Admit with SOB  STUDIES:  CT chest w/o 06/09/15 >> ABG on 6L o2 5/6 >> 7.42/ pO2 70.5/ pCO2 59.9/ HCO3 37.4   SUBJECTIVE:   MBS results noted, see below.  Pt denies acute complaints.  Did not wear bipap last pm / no distress.    VITAL SIGNS: Temp:  [97.5 F (36.4 C)-98.3 F (36.8 C)] 97.5 F (36.4 C) (05/10 0800) Pulse Rate:  [69-92] 92 (05/10 0800) Resp:  [10-24] 24 (05/10 0800) BP: (134-172)/(75-88) 172/87 mmHg (05/10 0800) SpO2:  [90 %-98 %] 94 % (05/10 0850) Weight:  [145 lb 11.2 oz (66.089 kg)] 145 lb 11.2 oz (66.089 kg) (05/10 0400)  PHYSICAL EXAMINATION:   General: Obese female, awake, elderly woman, NAD Neuro:  Oriented, speech clear, moves extrems x 4, non-focal HEENT:  Speech clear, no stridor, no JVD, grossly nl vision and hearing Cardiovascular:  RRR, no m/g/r, no peripheral edema Lungs: unlabored on nasal 3LNC, lungs bilaterally diminished, basilar crackles Abdomen:  Obese, soft, non-tender, BS present Musculoskeletal:  No acute deformities, generalized weakness Skin:  No rash, tatoo L ankle   Recent Labs Lab  06/11/15 1114 06/12/15 0959 06/13/15 0544  NA 135 136 137  K 4.1 3.5 3.4*  CL 90* 90* 89*  CO2 34* 34* 36*  BUN 7 7 9   CREATININE 0.83 0.88 0.71  GLUCOSE 279* 271* 202*    Recent Labs Lab 06/10/15 0305 06/11/15 1114 06/12/15 0311 06/12/15 0959  HGB 8.9* 8.8* 9.8* 10.5*  HCT 28.3* 28.9* 32.7* 33.6*  WBC 10.3 15.2*  --  13.7*  PLT 561* 604*  --  537*   Dg Swallowing Func-speech Pathology  06/13/2015  Objective Swallowing Evaluation: Type of Study: MBS-Modified Barium Swallow Study Patient Details Name: [name] MRN: [id number] Date of Birth: 04/11/1945 Today's Date: 06/13/2015 Time: SLP Start Time (ACUTE ONLY): 1415-SLP Stop Time (ACUTE ONLY): 1433 SLP Time Calculation (min) (ACUTE ONLY): 18 min Past Medical History: Past Medical History Diagnosis Date . GERD (gastroesophageal reflux disease)  . Allergic rhinitis  . Pneumonia  . Hypertension  . Diabetes mellitus, type 2 ([place])  . Adenomatous colon polyp 07/1996   Tubulovillous adenoma . Segmental colitis ([place]) 07/1996 . Duodenal ulcer  . Hiatal hernia  . Diverticulosis  . IBS (irritable bowel syndrome)  . Diverticulitis 2001 . Anxiety  . Chronic airway obstruction, not elsewhere classified  . 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 Tubulovillous adenoma of colon 1998 Past Surgical History: Past Surgical History Procedure Laterality Date . Cesarean section  1971 . Abdominal hysterectomy  1980 . Colectomy  07/1999   Sigmoid-chronic diverticulitis . Rod  in left leg   . Cholecystectomy     ERCP sphincterotomy, stone extraction 2006 . Endoscopic retrograde cholangiopancreatography (ercp) with propofol  11/06/2004   RMR: Normal appearing ampulla/Mildly diffusely dilated biliary tree/ status post sphincterotomy and stone extraction as . Esophagogastroduodenoscopy  09/1999   [place]: Gastritis, mild duodenitis . Colonoscopy  05/1999   [place]: Multiple colonic polys (benign polypoid mucosa), very tortuous sigmoid colon .  Colonoscopy  01/13/2012   Procedure: COLONOSCOPY;  Surgeon: Daneil Dolin, MD;  [facility];  Service: Endoscopy;  Laterality: N/A;  10:30 . Cataract extraction w/phaco Left 08/02/2013   Procedure: CATARACT EXTRACTION PHACO AND INTRAOCULAR LENS PLACEMENT (IOC);  Surgeon: Williams Che, MD;  Location: [facility];  Service: Ophthalmology;  Laterality: Left;  CDE 2.57 . Cataract extraction w/phaco [name] 12/20/2013   Procedure: CATARACT EXTRACTION PHACO AND INTRAOCULAR LENS PLACEMENT; CDE: 2.56;  Surgeon: Williams Che, MD;  Location: [facility];  Service: Ophthalmology;  Laterality: [name]; HPI: 70 yo F former smoker with known COPD on 3L O2 at home who presents with SOB and recurrent PNA. Previous MBS in 2012 showed flash penetration with thin liquids. She describes difficulty with swallowing that includes solids getting stuck, regurgitation of solids, and difficulty breathing with PO intake.  Subjective: pt alert, describes difficulty swallowing as outlined in history Assessment / Plan / Recommendation CHL IP CLINICAL IMPRESSIONS 06/13/2015 Therapy Diagnosis Mild pharyngeal phase dysphagia Clinical Impression Pt has a mild pharyngeal dysphagia that is likely respiratory based, with mild delay in swallow and incomplete airway closure resulting in small amounts of frank, silent penetration with thin liquids. While it is only a small amount at a time, it happens consistently with each swallow despite attempted maneuvers to prevent it. Over time this has likely been leading to aspiration, although no barium was observed to pass below the vocal cords during this test. No frank penetration was observed with nectar thick liquids or solids. Particularly in light of pt's recurrent PNA, recommend Dys 3 diet and nectar thick liquids. Will f/u for tolerance and education. Impact on safety and function Mild aspiration risk   CHL IP TREATMENT RECOMMENDATION 06/13/2015 Treatment Recommendations Therapy as outlined in treatment  plan below   Prognosis 06/13/2015 Prognosis for Safe Diet Advancement Fair Barriers to Reach Goals Other (Comment) Barriers/Prognosis Comment -- CHL IP DIET RECOMMENDATION 06/13/2015 SLP Diet Recommendations Dysphagia 3 (Mech soft) solids;Nectar thick liquid Liquid Administration via Cup;Straw Medication Administration Whole meds with puree Compensations Slow rate;Small sips/bites;Follow solids with liquid Postural Changes Remain semi-upright after after feeds/meals (Comment);Seated upright at 90 degrees   CHL IP OTHER RECOMMENDATIONS 06/13/2015 Recommended Consults -- Oral Care Recommendations Oral care BID Other Recommendations Order thickener from pharmacy;Prohibited food (jello, ice cream, thin soups);Remove water pitcher   CHL IP FOLLOW UP RECOMMENDATIONS 06/13/2015 Follow up Recommendations Home health SLP   CHL IP FREQUENCY AND DURATION 06/13/2015 Speech Therapy Frequency (ACUTE ONLY) min 2x/week Treatment Duration 2 weeks      CHL IP ORAL PHASE 06/13/2015 Oral Phase WFL Oral - Pudding Teaspoon -- Oral - Pudding Cup -- Oral - Honey Teaspoon -- Oral - Honey Cup -- Oral - Nectar Teaspoon -- Oral - Nectar Cup -- Oral - Nectar Straw -- Oral - Thin Teaspoon -- Oral - Thin Cup -- Oral - Thin Straw -- Oral - Puree -- Oral - Mech Soft -- Oral - Regular -- Oral - Multi-Consistency -- Oral - Pill -- Oral Phase - Comment --  CHL IP PHARYNGEAL PHASE 06/13/2015  Pharyngeal Phase Impaired Pharyngeal- Pudding Teaspoon -- Pharyngeal -- Pharyngeal- Pudding Cup -- Pharyngeal -- Pharyngeal- Honey Teaspoon -- Pharyngeal -- Pharyngeal- Honey Cup -- Pharyngeal -- Pharyngeal- Nectar Teaspoon -- Pharyngeal -- Pharyngeal- Nectar Cup Delayed swallow initiation-vallecula;Penetration/Aspiration during swallow Pharyngeal Material enters airway, remains ABOVE vocal cords then ejected out Pharyngeal- Nectar Straw Delayed swallow initiation-vallecula;Penetration/Aspiration during swallow Pharyngeal Material enters airway, remains ABOVE vocal cords then  ejected out Pharyngeal- Thin Teaspoon -- Pharyngeal -- Pharyngeal- Thin Cup Delayed swallow initiation-pyriform sinuses;Penetration/Aspiration during swallow Pharyngeal Material enters airway, remains ABOVE vocal cords and not ejected out Pharyngeal- Thin Straw Delayed swallow initiation-pyriform sinuses;Penetration/Aspiration during swallow Pharyngeal Material enters airway, remains ABOVE vocal cords and not ejected out Pharyngeal- Puree Delayed swallow initiation-vallecula Pharyngeal -- Pharyngeal- Mechanical Soft Delayed swallow initiation-vallecula Pharyngeal -- Pharyngeal- Regular -- Pharyngeal -- Pharyngeal- Multi-consistency -- Pharyngeal -- Pharyngeal- Pill -- Pharyngeal -- Pharyngeal Comment --  CHL IP CERVICAL ESOPHAGEAL PHASE 06/13/2015 Cervical Esophageal Phase WFL Pudding Teaspoon -- Pudding Cup -- Honey Teaspoon -- Honey Cup -- Nectar Teaspoon -- Nectar Cup -- Nectar Straw -- Thin Teaspoon -- Thin Cup -- Thin Straw -- Puree -- Mechanical Soft -- Regular -- Multi-consistency -- Pill -- Cervical Esophageal Comment -- No flowsheet data found. Germain Osgood, M.A. CCC-SLP [phone] Germain Osgood 06/13/2015, 3:57 PM              STUDIES:  5/09  SLP MBS >> mild pharyngeal phase dysphagia, mild pharyngeal dysphagia that is likely respiratory based, incomplete airway closure resulting in small amts frank, silent penetration, while only a small amt, it happens consistently.  D3 diet with nectar thick liquids recommended.    ASSESSMENT / PLAN:   Multilobar Pneumonia - Suspect recurrent aspiration and viral infections over last several months. Upper zone predominance not typical for aspiration, but daughter never told of specific culture identifications elsewhere and repetitive nature concerning for aspiration. Would like to attempt better ventilation with mobilization of secretions for culture. Interesting that CT from years ago showed similar upper zone ground glass density.  She also had a SLP  evaluation with flash penetration of liquids.  P: Culture negative thus far Narrow abx to rocephin > no specific organisms thus far, no WBC or MRSA.  Consider 7-8 days therapy, oral augmentin when primary svc deems appropriate to transition Intermittent CXR Pulmonary hygiene  SLP following, appreciate assistance   Consider outpatient SLP at discharge Will need O2 assessment prior to discharge Aspiration precautions  Respiratory Failure - A on C, hypoxic and hypercapneic. Denies diagnosed OSA, but probable obesity-hypoventilation. Sedatives and supplemental O2 need to be used conservatively. Key will be mobilization to improve lung expansion P: Mobilize early and often, up in chair, PT/OT Incentive spirometry ordered Goal O2 sat 89-92% BIPAP per RT PRN for increased WOB, QHS Elevate head of bed Consider outpatient evaluation for OHS   COPD mixed type - with acute exacerbation P:  Continue bronchodilators, steroids   Anemia - unspecified, likely chronic disease P:  Recommend basic eval incl stool for OB.  Defer work up to primary SVC.    GLOBAL:  DNR   PCCM will be available PRN. Please call if new needs arise.    Noe Gens, NP-C [facility] Pgr: [phone] or if no answer [phone] 06/14/2015, 9:46 AM   Attending note: I have seen and examined the patient with nurse practitioner/resident and agree with the note. History, labs and imaging reviewed.  70 Y/O with COPD, recurrent aspiration, pneumonia Resp status is  improved. Aspiration precautions as per SLP.  PCCM will sign off. Please call with any questions.  Marshell Garfinkel MD [facility] Pager [phone] If no answer or after 3pm call: [phone] 06/14/2015, 1:38 PM

## 2015-06-15 LAB — GLUCOSE, CAPILLARY
GLUCOSE-CAPILLARY: 245 mg/dL — AB (ref 65–99)
GLUCOSE-CAPILLARY: 268 mg/dL — AB (ref 65–99)
GLUCOSE-CAPILLARY: 140 mg/dL — AB (ref 65–99)
Glucose-Capillary: 164 mg/dL — ABNORMAL HIGH (ref 65–99)

## 2015-06-15 NOTE — Progress Notes (Signed)
Family Medicine Teaching Service Daily Progress Note Intern Pager: [phone]  Patient name: [name] Medical record number: [id number] Date of birth: Jul 08, 1945 Age: 70 y.o. Gender: female  Primary Care Provider: Glenda Chroman, MD Consultants: None Code Status: DNR   Pt Overview and Major Events to Date:  5/5: admitted for SOB  Assessment and Plan: [name] is a 70 y.o. female presenting with shortness of breath and cough. PMH is significant for COPD, HCAP, HTN,   Dyspnea/Respiratory failure: She is on baseline 3L [place] O2 at home. ECHO, 55-50% EF, grade 1 diastolic dysfunction, PA peak pressure 57 mmHg.  Modified Barium swallow - shows small aspiration, needs dysphagia 3 diet  - Appreciate pulm recs. - New baseline 4-5 Liters of oxygen  - Duonebs q4hrs prn - used x 2  - Continue home Symbicort - Continue home Spiriva - Home today  Aspiration Pneumonia: CT scan suggesting these findings. She had 3 admissions at outside hospital in three months for pneumonia. She is from nursing facility. WBC trending down at 13.7. Blood cultures negative for growth.  Repeat CXR on 5/8 with Persistent confluent interstitial density in the left perihilar and right infrahilar regions compatible with pneumonia.  Modified Barium swallow - shows small aspiration, needs dysphagia 3 diet  - s/p vancomycin and zosyn x 3 days approximately  - ceftriaxone x 2 days  - Continue Augmentin for day 3 of 7   Anemia - Normocytic. Hgb 10.5, normal iron panel  - consider outpatient colonoscopy   DM-2: CBG . Discharged on SSI Novolog & metformin 500 mg twice a day per discharge paper from recent hospitalization. - SSI sensitive   -  ACQHS   Hypertension: normotensive here. 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 Follow  up X-ray on 11/01/2015 showed impacted nondisplaced but less than 45 angulation in any fragment and less than 1 cm displacement.  -Tylenol as needed for pain - restarted oxy IR 5 mg Q12 PRN. She is on chronic narcotics for chronic back pain. Usually getting them Q4 or Q6   Anxiety/Insomnia/?Depression: Prozac 60 mg daily, Diazepam 5 mg twice a day prn , temazepam 15 mg at bedtime as needed.  -continue home prozac -started on diazepam yesterday for anxiety.   History of intermittent delirium: Zyprexa 10 mg at bedtime. -Zyprexa as needed  FEN/GI: Dysphagia 3 diet, saline lock  PPx: lovenox   Disposition: Home tomorrow   Subjective:  Home today. Does not like the the dysphagia 3 diet. Discussed risk vs benefits of this medication.   Objective: Temp:  [97.5 F (36.4 C)-98.2 F (36.8 C)] 98 F (36.7 C) (05/11 0358) Pulse Rate:  [65-96] 65 (05/11 0358) Resp:  [18-26] 22 (05/11 0358) BP: (107-172)/(55-102) 154/72 mmHg (05/11 0358) SpO2:  [92 %-98 %] 98 % (05/11 0358) Weight:  [142 lb 3.2 oz (64.501 kg)] 142 lb 3.2 oz (64.501 kg) (05/11 0601) Physical Exam: General: Elderly female  Cardiovascular: RRR, S1S2, no edema  Respiratory: 5 L O2. [place], Diminshed bibasilar breaths sounds, no increased WOB Abdomen: soft, Nt ND   Laboratory:  Recent Labs Lab 06/11/15 1114 06/12/15 0311 06/12/15 0959 06/14/15 1237  WBC 15.2*  --  13.7* 15.4*  HGB 8.8* 9.8* 10.5* 11.5*  HCT 28.9* 32.7* 33.6* 37.4  PLT 604*  --  537* 534*    Recent Labs Lab 06/10/15 0305  06/12/15 0959 06/13/15 0544 06/14/15 1237  NA 137  < > 136 137 136  K 4.4  < > 3.5 3.4* 3.6  CL 92*  < > 90* 89* 88*  CO2 36*  < > 34* 36* 35*  BUN 11  < > 7 9 9   CREATININE 0.80  < > 0.88 0.71 0.75  CALCIUM 8.6*  < > 8.6* 8.5* 8.7*  PROT 6.2*  --   --   --   --   BILITOT 0.3  --   --   --   --   ALKPHOS 53  --   --   --   --   ALT 14  --   --   --   --   AST 12*  --   --   --   --   GLUCOSE 174*  < > 271* 202* 360*  <  > = values in this interval not displayed.  Imaging/Diagnostic Tests: Dg Swallowing Func-speech Pathology  06/13/2015  Objective Swallowing Evaluation: Type of Study: MBS-Modified Barium Swallow Study Patient Details Name: [name] MRN: [id number] Date of Birth: 1945/06/30 Today's Date: 06/13/2015 Time: SLP Start Time (ACUTE ONLY): 1415-SLP Stop Time (ACUTE ONLY): 1433 SLP Time Calculation (min) (ACUTE ONLY): 18 min Past Medical History: Past Medical History Diagnosis Date . GERD (gastroesophageal reflux disease)  . Allergic rhinitis  . Pneumonia  . Hypertension  . Diabetes mellitus, type 2 ([place])  . Adenomatous colon polyp 07/1996   Tubulovillous adenoma . Segmental colitis ([place]) 07/1996 . Duodenal ulcer  . Hiatal hernia  . Diverticulosis  . IBS (irritable bowel syndrome)  . Diverticulitis 2001 . Anxiety  . Chronic airway obstruction, not elsewhere classified  . 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 Tubulovillous adenoma of colon 1998 Past Surgical History: Past Surgical History Procedure Laterality Date . Cesarean section  1971 . Abdominal hysterectomy  1980 . Colectomy  07/1999   Sigmoid-chronic diverticulitis . Rod in left leg   . Cholecystectomy     ERCP sphincterotomy, stone extraction 2006 . Endoscopic retrograde cholangiopancreatography (ercp) with propofol  11/06/2004   RMR: Normal appearing ampulla/Mildly diffusely dilated biliary tree/ status post sphincterotomy and stone extraction as . Esophagogastroduodenoscopy  09/1999   [place]: Gastritis, mild duodenitis . Colonoscopy  05/1999   [place]: Multiple colonic polys (benign polypoid mucosa), very tortuous sigmoid colon . Colonoscopy  01/13/2012   Procedure: COLONOSCOPY;  Surgeon: Daneil Dolin, MD;  [facility];  Service: Endoscopy;  Laterality: N/A;  10:30 . Cataract extraction w/phaco Left 08/02/2013   Procedure: CATARACT EXTRACTION PHACO AND INTRAOCULAR LENS PLACEMENT (IOC);  Surgeon: Williams Che, MD;   Location: [facility];  Service: Ophthalmology;  Laterality: Left;  CDE 2.57 . Cataract extraction w/phaco Right 12/20/2013   Procedure: CATARACT EXTRACTION PHACO AND INTRAOCULAR LENS PLACEMENT; CDE: 2.56;  Surgeon: Williams Che, MD;  Location: [facility];  Service: Ophthalmology;  Laterality: Right; HPI: 70 yo F former smoker with known COPD on 3L O2 at home who presents with SOB and recurrent PNA. Previous MBS in 2012 showed flash penetration with thin liquids. She describes difficulty with swallowing that includes solids getting stuck, regurgitation of solids, and difficulty breathing with PO intake.  Subjective: pt alert, describes difficulty swallowing as outlined in history Assessment / Plan / Recommendation CHL IP CLINICAL IMPRESSIONS 06/13/2015 Therapy Diagnosis Mild pharyngeal phase dysphagia Clinical Impression  Pt has a mild pharyngeal dysphagia that is likely respiratory based, with mild delay in swallow and incomplete airway closure resulting in small amounts of frank, silent penetration with thin liquids. While it is only a small amount at a time, it happens consistently with each swallow despite attempted maneuvers to prevent it. Over time this has likely been leading to aspiration, although no barium was observed to pass below the vocal cords during this test. No frank penetration was observed with nectar thick liquids or solids. Particularly in light of pt's recurrent PNA, recommend Dys 3 diet and nectar thick liquids. Will f/u for tolerance and education. Impact on safety and function Mild aspiration risk   CHL IP TREATMENT RECOMMENDATION 06/13/2015 Treatment Recommendations Therapy as outlined in treatment plan below   Prognosis 06/13/2015 Prognosis for Safe Diet Advancement Fair Barriers to Reach Goals Other (Comment) Barriers/Prognosis Comment -- CHL IP DIET RECOMMENDATION 06/13/2015 SLP Diet Recommendations Dysphagia 3 (Mech soft) solids;Nectar thick liquid Liquid Administration via Cup;Straw Medication  Administration Whole meds with puree Compensations Slow rate;Small sips/bites;Follow solids with liquid Postural Changes Remain semi-upright after after feeds/meals (Comment);Seated upright at 90 degrees   CHL IP OTHER RECOMMENDATIONS 06/13/2015 Recommended Consults -- Oral Care Recommendations Oral care BID Other Recommendations Order thickener from pharmacy;Prohibited food (jello, ice cream, thin soups);Remove water pitcher   CHL IP FOLLOW UP RECOMMENDATIONS 06/13/2015 Follow up Recommendations Home health SLP   CHL IP FREQUENCY AND DURATION 06/13/2015 Speech Therapy Frequency (ACUTE ONLY) min 2x/week Treatment Duration 2 weeks      CHL IP ORAL PHASE 06/13/2015 Oral Phase WFL Oral - Pudding Teaspoon -- Oral - Pudding Cup -- Oral - Honey Teaspoon -- Oral - Honey Cup -- Oral - Nectar Teaspoon -- Oral - Nectar Cup -- Oral - Nectar Straw -- Oral - Thin Teaspoon -- Oral - Thin Cup -- Oral - Thin Straw -- Oral - Puree -- Oral - Mech Soft -- Oral - Regular -- Oral - Multi-Consistency -- Oral - Pill -- Oral Phase - Comment --  CHL IP PHARYNGEAL PHASE 06/13/2015 Pharyngeal Phase Impaired Pharyngeal- Pudding Teaspoon -- Pharyngeal -- Pharyngeal- Pudding Cup -- Pharyngeal -- Pharyngeal- Honey Teaspoon -- Pharyngeal -- Pharyngeal- Honey Cup -- Pharyngeal -- Pharyngeal- Nectar Teaspoon -- Pharyngeal -- Pharyngeal- Nectar Cup Delayed swallow initiation-vallecula;Penetration/Aspiration during swallow Pharyngeal Material enters airway, remains ABOVE vocal cords then ejected out Pharyngeal- Nectar Straw Delayed swallow initiation-vallecula;Penetration/Aspiration during swallow Pharyngeal Material enters airway, remains ABOVE vocal cords then ejected out Pharyngeal- Thin Teaspoon -- Pharyngeal -- Pharyngeal- Thin Cup Delayed swallow initiation-pyriform sinuses;Penetration/Aspiration during swallow Pharyngeal Material enters airway, remains ABOVE vocal cords and not ejected out Pharyngeal- Thin Straw Delayed swallow initiation-pyriform  sinuses;Penetration/Aspiration during swallow Pharyngeal Material enters airway, remains ABOVE vocal cords and not ejected out Pharyngeal- Puree Delayed swallow initiation-vallecula Pharyngeal -- Pharyngeal- Mechanical Soft Delayed swallow initiation-vallecula Pharyngeal -- Pharyngeal- Regular -- Pharyngeal -- Pharyngeal- Multi-consistency -- Pharyngeal -- Pharyngeal- Pill -- Pharyngeal -- Pharyngeal Comment --  CHL IP CERVICAL ESOPHAGEAL PHASE 06/13/2015 Cervical Esophageal Phase WFL Pudding Teaspoon -- Pudding Cup -- Honey Teaspoon -- Honey Cup -- Nectar Teaspoon -- Nectar Cup -- Nectar Straw -- Thin Teaspoon -- Thin Cup -- Thin Straw -- Puree -- Mechanical Soft -- Regular -- Multi-consistency -- Pill -- Cervical Esophageal Comment -- No flowsheet data found. Germain Osgood, M.A. CCC-SLP [phone] Germain Osgood 06/13/2015, 3:57 PM               Basir Niven Cletis Media, MD 06/15/2015, 7:13 AM PGY-1, [facility] Intern pager: [phone], text pages welcome

## 2015-06-15 NOTE — Discharge Summary (Signed)
[facility] Discharge Summary  Patient name: [name] Medical record number: [id number] Date of birth: May 21, 1945 Age: 70 y.o. Gender: female Date of Admission: 06/09/2015  Date of Discharge: 06/16/2015 Admitting Physician: Dickie La, MD  Primary Care Provider: Glenda Chroman, MD Consultants: Pulmonology   Indication for Hospitalization: Aspiration Pneumonia   Discharge Diagnoses/Problem List:  Patient Active Problem List   Diagnosis Date Noted  . Anemia due to other cause 06/10/2015  . Lobar pneumonia, unspecified organism ([place]) 06/10/2015  . HCAP (healthcare-associated pneumonia)   . SOB (shortness of breath)   . Notalgia   . Anxiety state   . COPD with acute lower respiratory infection ([place]) 06/09/2015  . Decompensated COPD with exacerbation (chronic obstructive pulmonary disease) ([place]) 06/09/2015  . Acute exacerbation of COPD with asthma ([place])   . Acute on chronic respiratory failure with hypoxia and hypercapnia (HCC)   . 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 Tachycardia 12/02/2010  . Transaminitis 12/01/2010  . COPD with exacerbation ([place]) 11/29/2010  . Hyponatremia 11/29/2010  . Back pain, chronic 11/29/2010  . Hypertension 11/29/2010  . Oral candidiasis 11/29/2010   Disposition: Home   Discharge Condition: stable   Discharge Exam General: Elderly female  Cardiovascular: RRR, S1S2, no edema  Respiratory: 3 L O2. Sandy, Diminshed bibasilar breaths sounds, no increased WOB Abdomen: soft, Nt ND  Brief Hospital Course:  [name] is 70 y.o. presenting with shortness of breath and cough. Patient's lung exam on admission with diminished air sounds throughout,  there was some concern that patient was having a COPD exacerbation. Patient was started on steroids and breathing treatments for this. She was placed on CPAP for desaturation into the 70's. She was weaned to 6 Liter [place] and to increase oxygen carrying capacity, patient  was transfused 1 unit of blood. Patient continued to receive breathing treatments and improved after receiving approximately 7 days of steroids. However, patient's oxygen status, prior to admission was only on 3 Liters, however at discharge patient new baseline seemed to be 4-5 Liters.    On admission, patient was also noted to have a slight leukocytosis and tachycardia. She had 3 previous admission for pneumonia. CXR showed increased opacities over lower lung fields which could be pneumonia or atelectasis. S/p vanc and cefepime in ED. Patient was then continued on vancomycin and zosyn. Pulmonolgy as consulted and obtained a sputum culture and nasal viral swab. They stopped vancomycin and then once sputum culture was found to be negative for pseudomonas narrowed her to ceftriaxone. There was concern for possibly aspiration pneumonia and therefore swallow study was performed, indicating with mild pharyngeal dysphagia that is likely respiratory based. Patient was started on dysphagia 3 diet and was transitioned to Augmentin for anaerobic coverage. Patient received approximately 4 days of Augmentin, before discharge with instruction for 3 more days at discharge, to completely a 11 day course of antibiotics.   Patient was noted to have osteoarthritis and fracture of proximal right humerus. Patient was treated with sling per ortho note from 10/21/2014. Follow up X-ray on 11/01/2015 showed impacted nondisplaced but less than 45 angulation in any fragment and less than 1 cm displacement. Patient continued to complain of pain throughout admission and was therefore restarted on Roxicodone 5 mg every 4 hours. She has also been on  chronic narcotics for chronic back pain.  Issues for Follow Up:  1. Continue Augmentin twice daily for 3 more days (last day of Augmentin on 5/15)  may  need night time dose tonight.  2. Increase oxygen to 4 Liters at rest and 5 Liters when active  3. Patient will need to continue a dysphagia  3 diet as mild risk of aspiration   Significant Procedures: None   Significant Labs and Imaging:   Recent Labs Lab 06/12/15 0959 06/14/15 1237 06/16/15 0508  WBC 13.7* 15.4* 14.8*  HGB 10.5* 11.5* 10.6*  HCT 33.6* 37.4 35.4*  PLT 537* 534* 369    Recent Labs Lab 06/09/15 1641  06/10/15 0305 06/11/15 1114 06/12/15 0959 06/13/15 0544 06/14/15 1237 06/16/15 0508  NA  --   < > 137 135 136 137 136 138  K  --   < > 4.4 4.1 3.5 3.4* 3.6 3.1*  CL  --   < > 92* 90* 90* 89* 88* 86*  CO2  --   < > 36* 34* 34* 36* 35* 41*  GLUCOSE  --   < > 174* 279* 271* 202* 360* 213*  BUN  --   < > 11 7 7 9 9 10   CREATININE 0.74  --  0.80 0.83 0.88 0.71 0.75 0.70  CALCIUM  --   < > 8.6* 8.7* 8.6* 8.5* 8.7* 8.5*  MG 1.9  --   --   --   --   --   --   --   PHOS 3.0  --   --   --   --   --   --   --   ALKPHOS  --   --  53  --   --   --   --   --   AST  --   --  12*  --   --   --   --   --   ALT  --   --  14  --   --   --   --   --   ALBUMIN  --   --  2.2*  --   --   --   --   --   < > = values in this interval not displayed.  Results/Tests Pending at Time of Discharge: none  Discharge Medications:    Medication List    STOP taking these medications        enoxaparin 40 MG/0.4ML injection  Commonly known as:  LOVENOX     insulin aspart 100 UNIT/ML injection  Commonly known as:  novoLOG     TUBERCULIN PPD ID      TAKE these medications        acetaminophen 500 MG tablet  Commonly known as:  TYLENOL  Take 500 mg by mouth every 6 (six) hours as needed for mild pain.     albuterol 108 (90 Base) MCG/ACT inhaler  Commonly known as:  PROVENTIL HFA;VENTOLIN HFA  Inhale 2 puffs into the lungs every 6 (six) hours as needed. Shortness of Breath     albuterol (2.5 MG/3ML) 0.083% nebulizer solution  Commonly known as:  PROVENTIL  Take 2.5 mg by nebulization daily as needed for wheezing or shortness of breath.     amoxicillin-clavulanate 875-125 MG tablet  Commonly known as:  AUGMENTIN   Take 1 tablet by mouth every 12 (twelve) hours.     atorvastatin 10 MG tablet  Commonly known as:  LIPITOR  Take 10 mg by mouth daily.     budesonide-formoterol 160-4.5 MCG/ACT inhaler  Commonly known as:  SYMBICORT  Inhale 2 puffs into the lungs 2 (two) times  daily.     calcium carbonate 500 MG chewable tablet  Commonly known as:  TUMS - dosed in mg elemental calcium  Chew 1 tablet by mouth 2 (two) times daily.     cetirizine 10 MG tablet  Commonly known as:  ZYRTEC  Take 10 mg by mouth at bedtime.     diazepam 5 MG tablet  Commonly known as:  VALIUM  Take 5 mg by mouth 2 (two) times daily as needed for anxiety.     diltiazem 90 MG tablet  Commonly known as:  CARDIZEM  Take 45 mg by mouth 4 (four) times daily.     DUONEB 0.5-2.5 (3) MG/3ML Soln  Generic drug:  ipratropium-albuterol  Take 3 mLs by nebulization every 6 (six) hours.     ergocalciferol 50000 units capsule  Commonly known as:  VITAMIN D2  Take 50,000 Units by mouth once a week.     famotidine 20 MG tablet  Commonly known as:  PEPCID  Take 20 mg by mouth daily.     feeding supplement (GLUCERNA SHAKE) Liqd  Take 237 mLs by mouth 2 (two) times daily between meals.     FLUoxetine 20 MG capsule  Commonly known as:  PROZAC  Take 60 mg by mouth daily.     food thickener Powd  Commonly known as:  THICK IT  Thicken liquids as described by nutrition     furosemide 20 MG tablet  Commonly known as:  LASIX  Take 20 mg by mouth daily.     lisinopril 40 MG tablet  Commonly known as:  PRINIVIL,ZESTRIL  Take 40 mg by mouth daily.     metFORMIN 500 MG tablet  Commonly known as:  GLUCOPHAGE  Take 500 mg by mouth 2 (two) times daily with a meal.     OLANZapine 10 MG tablet  Commonly known as:  ZYPREXA  Take 10 mg by mouth at bedtime.     omeprazole 20 MG capsule  Commonly known as:  PRILOSEC  Take 20 mg by mouth daily.     oxyCODONE 5 MG immediate release tablet  Commonly known as:  Oxy IR/ROXICODONE   Take 1 tablet (5 mg total) by mouth every 4 (four) hours as needed for moderate pain or severe pain.     senna-docusate 8.6-50 MG tablet  Commonly known as:  Senokot-S  Take 1 tablet by mouth daily.     temazepam 15 MG capsule  Commonly known as:  RESTORIL  Take 15 mg by mouth at bedtime as needed for sleep.     tiotropium 18 MCG inhalation capsule  Commonly known as:  SPIRIVA  Place 18 mcg into inhaler and inhale daily.        Discharge Instructions: Please refer to Patient Instructions section of EMR for full details.  Patient was counseled important signs and symptoms that should prompt return to medical care, changes in medications, dietary instructions, activity restrictions, and follow up appointments.   Follow-Up Appointments: Follow-up Information    Follow up with Adult Pediatric Specialist.   Why:  supplies oxygen    Contact information:   Home [place] [address] [phone]       Follow up with Glenda Chroman, MD. Schedule an appointment as soon as possible for a visit on 06/21/2015.   Specialty:  Internal Medicine   Why:  Hospital follow-up   Contact information:   [address] [place] [address] [phone]       Rielynn Trulson  Cletis Media, MD 06/16/2015, 2:12 PM PGY-1, [place]

## 2015-06-15 NOTE — Progress Notes (Signed)
SATURATION QUALIFICATIONS: (This note is used to comply with regulatory documentation for home oxygen)  Patient Saturations on Room Air at Rest = significant desaturation on room air at rest, immediately to 82% without activity  Patient Saturations on Room Air while Ambulating = could not perform  Patient Saturations on 4 Liters of oxygen at rest = 89%  Patient Saturations on 4 Liters of oxygen while Ambulating = 86%  Patient Saturations on 5 Liters of oxygen at rest = 95%  Patient Saturations on 5 Liters of oxygen while Ambulating = 89%  Please briefly explain why patient needs home oxygen:patient with desaturation during minimal activity, currently requiring 4 liters at rest, 5 liters with activity to maintain saturations >88%.  Alben Deeds, [place] DPT  [phone]

## 2015-06-15 NOTE — Progress Notes (Signed)
Results for LAYNA, FLAKE (MRN [id number]) as of 06/15/2015 12:45  Ref. Range 06/14/2015 11:25 06/14/2015 16:46 06/14/2015 21:41 06/15/2015 07:39 06/15/2015 11:36  Glucose-Capillary Latest Ref Range: 65-99 mg/dL 263 (H) 389 (H) 191 (H) 164 (H) 245 (H)  Noted that blood sugars continue to be greater than 180 mg/dl. Recommend increasing Novolog correction scale to MODERATE TID & HS.  May need to add Novolog 3-4 units TID if postprandial blood sugars continue to be elevated. Will continue to monitor. Harvel Ricks RN BSN CDE

## 2015-06-15 NOTE — Progress Notes (Signed)
Physical Therapy Treatment Patient Details Name: [name] MRN: [id number] DOB: Aug 06, 1945 Today's Date: 06/15/2015    History of Present Illness Pt admitted with SOB/cough with multilobal PNA from [facility] where she was receiving rehab. PMH: COPD, HCAP, HTN, anemia, DM, tachycardia, R humeral fx, delirium. Pt has been from [facility] to [facility] to [employer] and desires to go home with her husband.    PT Comments    Patient seen for mobility progression and oxygen saturation assessment. Patient at rest on 5 liters 95%, ambulated in room on 5 liters [place] with saturations dropping to 89%. Attempted to mobilize on 4 liters [place] but saturations began to drop below 86%, despite cues for pursed lip breathing. Will continue to see and progress as tolerated.  Follow Up Recommendations  Home health PT;Supervision/Assistance - 24 hour     Equipment Recommendations  3in1 (PT);Other (comment) (4 wheels rolator walker with seat)    Recommendations for Other Services Rehab consult     Precautions / Restrictions Precautions Precautions: Fall Precaution Comments: watch sats Restrictions Weight Bearing Restrictions: No    Mobility  Bed Mobility Overal bed mobility: Needs Assistance Bed Mobility: Supine to Sit;Sit to Supine     Supine to sit: Supervision;HOB elevated Sit to supine: Supervision   General bed mobility comments: increased time, no physical assist  Transfers Overall transfer level: Needs assistance Equipment used: Rolling walker (2 wheeled) Transfers: Sit to/from Stand Sit to Stand: Min guard         General transfer comment: VCs for hand placement  Ambulation/Gait Ambulation/Gait assistance: Supervision Ambulation Distance (Feet): 40 Feet Assistive device: Rolling walker (2 wheeled) Gait Pattern/deviations: Step-through pattern;Decreased stride length Gait velocity: decreased   General Gait Details: continued cues for breathing   (ambualted on 5 liters [place])   Stairs            Wheelchair Mobility    Modified Rankin (Stroke Patients Only)       Balance Overall balance assessment: Needs assistance   Sitting balance-Leahy Scale: Good       Standing balance-Leahy Scale: Poor Standing balance comment: continued reliance on RW                    Cognition Arousal/Alertness: Awake/alert Behavior During Therapy: WFL for tasks assessed/performed Overall Cognitive Status: Within Functional Limits for tasks assessed                      Exercises      General Comments        Pertinent Vitals/Pain Pain Assessment: No/denies pain    Home Living                      Prior Function            PT Goals (current goals can now be found in the care plan section) Acute Rehab PT Goals Patient Stated Goal: go home  PT Goal Formulation: With patient Time For Goal Achievement: 06/28/15 Potential to Achieve Goals: Good Progress towards PT goals: Progressing toward goals    Frequency  Min 3X/week    PT Plan Current plan remains appropriate    Co-evaluation             End of Session Equipment Utilized During Treatment: Gait belt;Oxygen Activity Tolerance: Patient limited by fatigue Patient left: in bed;with call bell/phone within reach;with bed alarm set     Time: [id number] PT Time Calculation (min) (ACUTE  ONLY): 17 min  Charges:  $Gait Training: 8-22 mins                    G CodesDuncan Dull 05-Jul-2015, 1:17 PM Alben Deeds, [place] DPT  [phone]

## 2015-06-15 NOTE — Progress Notes (Signed)
  PHARMACY - PHYSICIAN COMMUNICATION CRITICAL VALUE ALERT - BLOOD CULTURE IDENTIFICATION (BCID)  Results for orders placed or performed during the hospital encounter of 06/09/15  Blood Culture ID Panel (Reflexed) (Collected: 06/09/2015  9:28 AM)  Result Value Ref Range   Enterococcus species NOT DETECTED NOT DETECTED   Vancomycin resistance NOT DETECTED NOT DETECTED   Listeria monocytogenes NOT DETECTED NOT DETECTED   Staphylococcus species NOT DETECTED NOT DETECTED   Staphylococcus aureus NOT DETECTED NOT DETECTED   Methicillin resistance NOT DETECTED NOT DETECTED   Streptococcus species NOT DETECTED NOT DETECTED   Streptococcus agalactiae NOT DETECTED NOT DETECTED   Streptococcus pneumoniae NOT DETECTED NOT DETECTED   Streptococcus pyogenes NOT DETECTED NOT DETECTED   Acinetobacter baumannii NOT DETECTED NOT DETECTED   Enterobacteriaceae species NOT DETECTED NOT DETECTED   Enterobacter cloacae complex NOT DETECTED NOT DETECTED   Escherichia coli NOT DETECTED NOT DETECTED   Klebsiella oxytoca NOT DETECTED NOT DETECTED   Klebsiella pneumoniae NOT DETECTED NOT DETECTED   Proteus species NOT DETECTED NOT DETECTED   Serratia marcescens NOT DETECTED NOT DETECTED   Carbapenem resistance NOT DETECTED NOT DETECTED   Haemophilus influenzae NOT DETECTED NOT DETECTED   Neisseria meningitidis NOT DETECTED NOT DETECTED   Pseudomonas aeruginosa NOT DETECTED NOT DETECTED   Candida albicans NOT DETECTED NOT DETECTED   Candida glabrata NOT DETECTED NOT DETECTED   Candida krusei NOT DETECTED NOT DETECTED   Candida parapsilosis NOT DETECTED NOT DETECTED   Candida tropicalis NOT DETECTED NOT DETECTED    Name of physician (or Provider) Contacted: Dr. Juanito Doom, Family Medicine.   Discussed with Dr. Juanito Doom.  She has 1 blood culture drawn on 5/5 that has Gram positive cocci in clusters.  Her other blood culture from 5/5 is negative. We would expect this is a Staphylococcus species.  However the BCID did  not detect any organism - it should detect a Staphylococcus species.  Patient is clinically stable and responding to Augmentin.  Discussed with Dr. Juanito Doom - we will not make changes to her antibiotics at this time and will continue to monitor for identification of her organism.  Legrand Como, Pharm.D., BCPS, [place] Clinical Pharmacist Phone: [phone] or [phone] Pager: [phone] 06/15/2015, 12:26 PM

## 2015-06-15 NOTE — Care Management Note (Addendum)
Case Management Note  Patient Details  Name: [name] MRN: [id number] Date of Birth: 07/07/1945  Subjective/Objective:      Dyspnea/Respiratory failure, aspiration pneumonia              Action/Plan: Discharge Planning:  NCM spoke to pt and gave permission to speak with husband, [name] [phone]. Contacted husband via phone. Husband states she has oxygen with [facility] # [phone] fax [phone]. Explained new orders will faxed to oxygen provider due to oxygen needs have changed. Offered choice for HH/provided list to pt. Husband states they do not want HHPT. States he is interested in pt have an aide each day. Pt does have Medicaid. Explained [place] [facility] services and that his PCP will have to complete the forms. Will leave forms in pt's room. Husband requesting RW, 3n1 and shower chair with seat. Contacted [facility] for DME for home. [facility] could not provide additional DME due to contract with Medicare. Can afford medications.  PCP-VYAS, Desiree Petty B MD, fax [phone]   1455 Portable tank delivered to room from [facility]. Faxed successfully transmitted. [facility] delivered RW, 3n1 an shower chair to room. Faxed PCS paperwork to PCP's office. Will fax dc summary when available.   Expected Discharge Date:  06/16/2015              Expected Discharge Plan:  [place]  In-House Referral:  Clinical Social Work  Discharge planning Services  CM Consult  Post Acute Care Choice:  Home Health Choice offered to:  Spouse  DME Arranged:  3-N-1, Walker rolling, Shower stool, Oxygen DME Agency:  [facility]  HH Arranged:  Patient Refused [place] Agency:  NA  Status of Service:  Completed, signed off  Medicare Important Message Given:  Yes Date Medicare IM Given:    Medicare IM give by:    Date Additional Medicare IM Given:    Additional Medicare Important Message give by:     If discussed at [place] of Stay Meetings, dates discussed:     Additional Comments:  Erenest Rasher, RN 06/15/2015, 2:01 PM

## 2015-06-16 LAB — BASIC METABOLIC PANEL
ANION GAP: 11 (ref 5–15)
BUN: 10 mg/dL (ref 6–20)
CHLORIDE: 86 mmol/L — AB (ref 101–111)
CO2: 41 mmol/L — ABNORMAL HIGH (ref 22–32)
Calcium: 8.5 mg/dL — ABNORMAL LOW (ref 8.9–10.3)
Creatinine, Ser: 0.7 mg/dL (ref 0.44–1.00)
Glucose, Bld: 213 mg/dL — ABNORMAL HIGH (ref 65–99)
Potassium: 3.1 mmol/L — ABNORMAL LOW (ref 3.5–5.1)
SODIUM: 138 mmol/L (ref 135–145)

## 2015-06-16 LAB — CULTURE, BLOOD (ROUTINE X 2)

## 2015-06-16 LAB — GLUCOSE, CAPILLARY
GLUCOSE-CAPILLARY: 260 mg/dL — AB (ref 65–99)
GLUCOSE-CAPILLARY: 220 mg/dL — AB (ref 65–99)
GLUCOSE-CAPILLARY: 239 mg/dL — AB (ref 65–99)

## 2015-06-16 LAB — CBC
HCT: 35.4 % — ABNORMAL LOW (ref 36.0–46.0)
HEMOGLOBIN: 10.6 g/dL — AB (ref 12.0–15.0)
MCH: 26.8 pg (ref 26.0–34.0)
MCHC: 29.9 g/dL — ABNORMAL LOW (ref 30.0–36.0)
MCV: 89.6 fL (ref 78.0–100.0)
PLATELETS: 369 10*3/uL (ref 150–400)
RBC: 3.95 MIL/uL (ref 3.87–5.11)
RDW: 15.6 % — ABNORMAL HIGH (ref 11.5–15.5)
WBC: 14.8 10*3/uL — AB (ref 4.0–10.5)

## 2015-06-16 MED ORDER — OXYCODONE HCL 5 MG PO TABS: 5.0000 mg | ORAL_TABLET | ORAL | Status: DC | PRN

## 2015-06-16 MED ORDER — POTASSIUM CHLORIDE CRYS ER 20 MEQ PO TBCR: 40.0000 meq | EXTENDED_RELEASE_TABLET | Freq: Once | ORAL | Status: DC

## 2015-06-16 MED ORDER — AMOXICILLIN-POT CLAVULANATE 875-125 MG PO TABS: 1.0000 | ORAL_TABLET | Freq: Two times a day (BID) | ORAL | Status: AC

## 2015-06-16 MED ORDER — STARCH (THICKENING) PO POWD: ORAL | Status: AC

## 2015-06-16 MED ORDER — POTASSIUM CHLORIDE CRYS ER 20 MEQ PO TBCR
40.0000 meq | EXTENDED_RELEASE_TABLET | Freq: Once | ORAL | Status: AC
  Administered 2015-06-16: 40 meq via ORAL
  Filled 2015-06-16: qty 2

## 2015-06-16 NOTE — Clinical Social Work Note (Signed)
Clinical Social Worker facilitated patient discharge including contacting patient family and facility to confirm patient discharge plans.  Clinical information faxed to facility and family agreeable with plan.  CSW arranged ambulance transport via [facility] to [facility].  RN to call report prior to discharge.  Clinical Social Worker will sign off for now as social work intervention is no longer needed. Please consult us again if new need arises.  Jesse Adabelle Griffiths, LCSW [phone] 

## 2015-06-16 NOTE — Discharge Instructions (Signed)
You were admitted for mult-lobar pneumonia likely due to aspiration.You will need to continue Augmentin for 3 more days. You will take this medication twice daily.  You were also noted to have COPD exacerbation for which you have been treated. You will likely need to go home on 4 Liters of oxygen and increase your oxygen to 5 L when you are active. You will need to continue the dysphagia diet as instructed by nutrition to help prevent another bout of pneumonia.   Instruction for dysphagia diet as below  Diet recommendations: Dysphagia 3 (mechanical soft);Nectar-thick liquid Liquids provided via: Cup;Straw Medication Administration: Whole meds with puree Supervision: Patient able to self feed;Intermittent supervision to cue for compensatory strategies Compensations: Slow rate;Small sips/bites;Follow solids with liquid Postural Changes and/or Swallow Maneuvers: Seated upright 90 degrees;Upright 30-60 min after meal

## 2015-06-16 NOTE — Progress Notes (Signed)
Occupational Therapy Treatment Patient Details Name: [name] MRN: [id number] DOB: 1945/11/22 Today's Date: 06/16/2015    History of present illness Pt admitted with SOB/cough with multilobal PNA from [facility] where she was receiving rehab. PMH: COPD, HCAP, HTN, anemia, DM, tachycardia, R humeral fx, delirium. Pt has been from [facility] to [facility] to [employer] and desires to go home with her husband.   OT comments  Pt anxious about stairs getting into her home.  Recommended pt consider ambulance transportation, MD agreeing. Pt requiring min assist for grooming in sitting and supervision for LB dressing and toileting. Educated in energy conservation. Much improved ability to use pursed lip breathing techniques during exertion, but 02 sats decreasing to 84% with minimal activity on 02.  Follow Up Recommendations  Home health OT;Supervision/Assistance - 24 hour (home health aide)    Equipment Recommendations  3 in 1 bedside comode [id number])    Recommendations for Other Services      Precautions / Restrictions Precautions Precautions: Fall Precaution Comments: watch sats       Mobility Bed Mobility Overal bed mobility: Modified Independent Bed Mobility: Supine to Sit           General bed mobility comments: increased time, HOB up  Transfers Overall transfer level: Needs assistance Equipment used: Rolling walker (2 wheeled) Transfers: Sit to/from Stand Sit to Stand: Supervision         General transfer comment: VCs for hand placement    Balance     Sitting balance-Leahy Scale: Good       Standing balance-Leahy Scale: Poor                     ADL Overall ADL's : Needs assistance/impaired     Grooming: Wash/dry hands;Wash/dry face;Brushing hair;Oral care;Sitting;Minimal assistance               Lower Body Dressing: Supervision/safety;Sit to/from stand   Toilet Transfer: Supervision/safety;Ambulation;RW;BSC    Toileting- Water quality scientist and Hygiene: Supervision/safety;Sit to/from stand       Functional mobility during ADLs: Supervision/safety;Rolling walker General ADL Comments: 02 sats to 84% with minimal exertion to 3 in 1 and then to chair.      Vision                     Perception     Praxis      Cognition   Behavior During Therapy: Anxious;Flat affect Overall Cognitive Status: Within Functional Limits for tasks assessed                       Extremity/Trunk Assessment               Exercises     Shoulder Instructions       General Comments      Pertinent Vitals/ Pain       Pain Assessment: No/denies pain  Home Living                                          Prior Functioning/Environment              Frequency Min 2X/week     Progress Toward Goals  OT Goals(current goals can now be found in the care plan section)  Progress towards OT goals: Progressing toward goals  Acute Rehab OT Goals Patient Stated Goal: go  home  Time For Goal Achievement: 06/28/15 Potential to Achieve Goals: Good  Plan Discharge plan remains appropriate    Co-evaluation                 End of Session Equipment Utilized During Treatment: Gait belt;Rolling walker;Oxygen   Activity Tolerance  (limited by distractions, anxiety)   Patient Left in chair;with call bell/phone within reach;with chair alarm set   Nurse Communication  (talked to MD about home by ambulance)        Time: [phone] OT Time Calculation (min): 41 min  Charges: OT General Charges $OT Visit: 1 Procedure OT Treatments $Self Care/Home Management : 38-52 mins  Malka So 06/16/2015, 9:33 AM  [phone]

## 2015-06-16 NOTE — NC FL2 (Signed)
[place] LEVEL OF CARE SCREENING TOOL     IDENTIFICATION  Patient Name: [name] Birthdate: 08-23-1945 Sex: female Admission Date (Current Location): 06/09/2015  [facility] and [place] Number:  Herbalist and Address:  [facility] [place] [address], [place], [place] [address]      Provider Number: [id number]  Attending Physician Name and Address:  Dickie La, MD  Relative Name and Phone Number:       Current Level of Care: Hospital Recommended Level of Care: [place] Prior Approval Number:    Date Approved/Denied:   PASRR Number:    Discharge Plan: Home    Current Diagnoses: Patient Active Problem List   Diagnosis Date Noted  . Anemia due to other cause 06/10/2015  . Lobar pneumonia, unspecified organism ([place]) 06/10/2015  . HCAP (healthcare-associated pneumonia)   . SOB (shortness of breath)   . Notalgia   . Anxiety state   . COPD with acute lower respiratory infection ([place]) 06/09/2015  . Decompensated COPD with exacerbation (chronic obstructive pulmonary disease) ([place]) 06/09/2015  . Acute exacerbation of COPD with asthma ([place])   . Acute on chronic respiratory failure with hypoxia and hypercapnia (HCC)   . 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 Tachycardia 12/02/2010  . Transaminitis 12/01/2010  . COPD with exacerbation ([place]) 11/29/2010  . Hyponatremia 11/29/2010  . Back pain, chronic 11/29/2010  . Hypertension 11/29/2010  . Oral candidiasis 11/29/2010    Orientation RESPIRATION BLADDER Height & Weight     Self, Time, Situation, Place  O2 (3L) Continent Weight: 141 lb 12.8 oz (64.32 kg) Height:  5\' 4"  (162.6 cm)  BEHAVIORAL SYMPTOMS/MOOD NEUROLOGICAL BOWEL NUTRITION STATUS      Continent Diet (Dysphagia 3 Nectar Thick Liquids)  AMBULATORY STATUS COMMUNICATION OF NEEDS Skin   Limited Assist Verbally Normal                       Personal Care Assistance Level of Assistance   Bathing, Feeding, Dressing Bathing Assistance: Limited assistance Feeding assistance: Independent Dressing Assistance: Limited assistance     Functional Limitations Info  Sight, Hearing, Speech Sight Info: Adequate Hearing Info: Adequate Speech Info: Adequate    SPECIAL CARE FACTORS FREQUENCY  PT (By licensed PT), OT (By licensed OT)     PT Frequency: 3 OT Frequency: 3            Contractures Contractures Info: Not present    Additional Factors Info  Code Status, Allergies, Psychotropic, Insulin Sliding Scale Code Status Info: DNR Allergies Info: Aspirin, Nsaids Psychotropic Info: Prozac and [place]  Insulin Sliding Scale Info: 3 times daily with meals and bedtime       Current Medications (06/16/2015):  This is the current hospital active medication list Current Facility-Administered Medications  Medication Dose Route Frequency Provider Last Rate Last Dose  . acetaminophen (TYLENOL) tablet 650 mg  650 mg Oral Q6H PRN Aquilla Hacker, MD       Or  . acetaminophen (TYLENOL) suppository 650 mg  650 mg Rectal Q6H PRN Aquilla Hacker, MD      . amoxicillin-clavulanate (AUGMENTIN) 875-125 MG per tablet 1 tablet  1 tablet Oral Q12H Aquilla Hacker, MD   1 tablet at 06/16/15 0845  . atorvastatin (LIPITOR) tablet 10 mg  10 mg Oral q1800 Mercy Riding, MD   10 mg at 06/15/15 1759  . diazepam (VALIUM) tablet 5 mg  5 mg  Oral BID Dickie La, MD   5 mg at 06/16/15 0846  . diltiazem (CARDIZEM) tablet 45 mg  45 mg Oral QID Mercy Riding, MD   45 mg at 06/16/15 0845  . enoxaparin (LOVENOX) injection 40 mg  40 mg Subcutaneous Q24H Aquilla Hacker, MD   40 mg at 06/15/15 2212  . feeding supplement (ENSURE ENLIVE) (ENSURE ENLIVE) liquid 237 mL  237 mL Oral BID BM Ashly M Gottschalk, DO   237 mL at 06/15/15 1201  . FLUoxetine (PROZAC) capsule 60 mg  60 mg Oral Daily Mercy Riding, MD   60 mg at 06/16/15 0844  . food thickener (THICK IT) powder   Oral PRN Dickie La, MD      . furosemide  (LASIX) tablet 20 mg  20 mg Oral Daily Mercy Riding, MD   20 mg at 06/16/15 0844  . insulin aspart (novoLOG) injection 0-5 Units  0-5 Units Subcutaneous QHS Mercy Riding, MD   3 Units at 06/13/15 2200  . insulin aspart (novoLOG) injection 0-9 Units  0-9 Units Subcutaneous TID WC Mercy Riding, MD   5 Units at 06/16/15 0844  . ipratropium-albuterol (DUONEB) 0.5-2.5 (3) MG/3ML nebulizer solution 3 mL  3 mL Nebulization Q4H PRN Asiyah Cletis Media, MD   3 mL at 06/16/15 0755  . lisinopril (PRINIVIL,ZESTRIL) tablet 40 mg  40 mg Oral Daily Mercy Riding, MD   40 mg at 06/16/15 0845  . mometasone-formoterol (DULERA) 200-5 MCG/ACT inhaler 2 puff  2 puff Inhalation BID Mercy Riding, MD   2 puff at 06/16/15 0756  . oxyCODONE (Oxy IR/ROXICODONE) immediate release tablet 5 mg  5 mg Oral Q4H while awake Dickie La, MD   5 mg at 06/16/15 0844  . polyethylene glycol (MIRALAX / GLYCOLAX) packet 17 g  17 g Oral BID Aquilla Hacker, MD   17 g at 06/15/15 0814  . potassium chloride SA (K-DUR,KLOR-CON) CR tablet 40 mEq  40 mEq Oral Once Asiyah Cletis Media, MD      . [place]   Oral PRN Dickie La, MD      . senna-docusate (Senokot-S) tablet 1 tablet  1 tablet Oral QHS Aquilla Hacker, MD   1 tablet at 06/14/15 2121  . sodium chloride flush (NS) 0.9 % injection 3 mL  3 mL Intravenous Q12H Aquilla Hacker, MD   3 mL at 06/16/15 0843  . temazepam (RESTORIL) capsule 7.5 mg  7.5 mg Oral QHS PRN Dickie La, MD       Facility-Administered Medications Ordered in Other Encounters  Medication Dose Route Frequency Provider Last Rate Last Dose  . mupirocin ointment (BACTROBAN) 2 %   Nasal BID Williams Che, MD         Discharge Medications: Please see discharge summary for a list of discharge medications.  Relevant Imaging Results:  Relevant Lab Results:   Additional Information SSN [id number]  Barbette Or, [place]

## 2015-06-16 NOTE — Plan of Care (Signed)
Problem: Education: Goal: Knowledge of disease or condition will improve Outcome: Completed/Met Date Met:  06/16/15 Reinforced need for patient to drink nectar thick liquids to prevent the recurrence of aspiration pneumonia.  Patient very reluctant to comply, although she has been compliant with nectar thick water for today.  Problem: Respiratory: Goal: Respiratory status will improve Outcome: Completed/Met Date Met:  06/16/15 Patient tolerating oxygen at 3 liters nasal cannula as per baseline

## 2015-06-16 NOTE — Care Management Important Message (Signed)
Important Message  Patient Details  Name: [name] MRN: [id number] Date of Birth: 1945-10-09   Medicare Important Message Given:  Yes    Nathen May 06/16/2015, 11:53 AM

## 2015-06-16 NOTE — Progress Notes (Signed)
Patient discharged to [facility] via [facility] [place].  Report given to [facility] nurse at [facility]. Family notified of patients discharge.  [name]

## 2015-06-30 ENCOUNTER — Inpatient Hospital Stay (HOSPITAL_COMMUNITY): Payer: Medicare Other

## 2015-06-30 ENCOUNTER — Inpatient Hospital Stay (HOSPITAL_COMMUNITY)
Admission: AD | Admit: 2015-06-30 | Discharge: 2015-07-06 | DRG: 189 | Disposition: A | Discharge status: Home / Self Care | Payer: Medicare Other | Source: Other Acute Inpatient Hospital | Attending: Family Medicine | Admitting: Family Medicine

## 2015-06-30 DIAGNOSIS — R0602 Shortness of breath: Secondary | ICD-10-CM

## 2015-06-30 DIAGNOSIS — I2781 Cor pulmonale (chronic): Secondary | ICD-10-CM | POA: Diagnosis present

## 2015-06-30 DIAGNOSIS — Z7951 Long term (current) use of inhaled steroids: Secondary | ICD-10-CM

## 2015-06-30 DIAGNOSIS — Z9842 Cataract extraction status, left eye: Secondary | ICD-10-CM | POA: Diagnosis not present

## 2015-06-30 DIAGNOSIS — J449 Chronic obstructive pulmonary disease, unspecified: Secondary | ICD-10-CM | POA: Diagnosis not present

## 2015-06-30 DIAGNOSIS — E871 Hypo-osmolality and hyponatremia: Secondary | ICD-10-CM | POA: Diagnosis present

## 2015-06-30 DIAGNOSIS — Z7984 Long term (current) use of oral hypoglycemic drugs: Secondary | ICD-10-CM

## 2015-06-30 DIAGNOSIS — G9341 Metabolic encephalopathy: Secondary | ICD-10-CM | POA: Diagnosis present

## 2015-06-30 DIAGNOSIS — I5032 Chronic diastolic (congestive) heart failure: Secondary | ICD-10-CM | POA: Diagnosis present

## 2015-06-30 DIAGNOSIS — Z66 Do not resuscitate: Secondary | ICD-10-CM | POA: Diagnosis not present

## 2015-06-30 DIAGNOSIS — R4182 Altered mental status, unspecified: Secondary | ICD-10-CM

## 2015-06-30 DIAGNOSIS — F329 Major depressive disorder, single episode, unspecified: Secondary | ICD-10-CM | POA: Diagnosis present

## 2015-06-30 DIAGNOSIS — J9600 Acute respiratory failure, unspecified whether with hypoxia or hypercapnia: Secondary | ICD-10-CM | POA: Diagnosis present

## 2015-06-30 DIAGNOSIS — I509 Heart failure, unspecified: Secondary | ICD-10-CM

## 2015-06-30 DIAGNOSIS — R1310 Dysphagia, unspecified: Secondary | ICD-10-CM | POA: Diagnosis present

## 2015-06-30 DIAGNOSIS — E875 Hyperkalemia: Secondary | ICD-10-CM | POA: Diagnosis present

## 2015-06-30 DIAGNOSIS — Z9841 Cataract extraction status, right eye: Secondary | ICD-10-CM | POA: Diagnosis not present

## 2015-06-30 DIAGNOSIS — E785 Hyperlipidemia, unspecified: Secondary | ICD-10-CM | POA: Diagnosis present

## 2015-06-30 DIAGNOSIS — Z79891 Long term (current) use of opiate analgesic: Secondary | ICD-10-CM | POA: Diagnosis not present

## 2015-06-30 DIAGNOSIS — D638 Anemia in other chronic diseases classified elsewhere: Secondary | ICD-10-CM | POA: Diagnosis present

## 2015-06-30 DIAGNOSIS — K449 Diaphragmatic hernia without obstruction or gangrene: Secondary | ICD-10-CM | POA: Diagnosis present

## 2015-06-30 DIAGNOSIS — M545 Low back pain: Secondary | ICD-10-CM | POA: Diagnosis present

## 2015-06-30 DIAGNOSIS — R278 Other lack of coordination: Secondary | ICD-10-CM | POA: Diagnosis present

## 2015-06-30 DIAGNOSIS — J9621 Acute and chronic respiratory failure with hypoxia: Secondary | ICD-10-CM | POA: Diagnosis present

## 2015-06-30 DIAGNOSIS — I272 Other secondary pulmonary hypertension: Secondary | ICD-10-CM | POA: Diagnosis present

## 2015-06-30 DIAGNOSIS — G9340 Encephalopathy, unspecified: Secondary | ICD-10-CM | POA: Diagnosis present

## 2015-06-30 DIAGNOSIS — L8990 Pressure ulcer of unspecified site, unspecified stage: Secondary | ICD-10-CM | POA: Insufficient documentation

## 2015-06-30 DIAGNOSIS — Z87891 Personal history of nicotine dependence: Secondary | ICD-10-CM | POA: Diagnosis not present

## 2015-06-30 DIAGNOSIS — E162 Hypoglycemia, unspecified: Secondary | ICD-10-CM | POA: Diagnosis not present

## 2015-06-30 DIAGNOSIS — Z9981 Dependence on supplemental oxygen: Secondary | ICD-10-CM

## 2015-06-30 DIAGNOSIS — R410 Disorientation, unspecified: Secondary | ICD-10-CM | POA: Diagnosis not present

## 2015-06-30 DIAGNOSIS — J441 Chronic obstructive pulmonary disease with (acute) exacerbation: Secondary | ICD-10-CM | POA: Diagnosis not present

## 2015-06-30 DIAGNOSIS — K219 Gastro-esophageal reflux disease without esophagitis: Secondary | ICD-10-CM | POA: Diagnosis present

## 2015-06-30 DIAGNOSIS — Z961 Presence of intraocular lens: Secondary | ICD-10-CM | POA: Diagnosis present

## 2015-06-30 DIAGNOSIS — F411 Generalized anxiety disorder: Secondary | ICD-10-CM | POA: Diagnosis not present

## 2015-06-30 DIAGNOSIS — E11649 Type 2 diabetes mellitus with hypoglycemia without coma: Secondary | ICD-10-CM | POA: Diagnosis present

## 2015-06-30 DIAGNOSIS — Z515 Encounter for palliative care: Secondary | ICD-10-CM | POA: Insufficient documentation

## 2015-06-30 DIAGNOSIS — G894 Chronic pain syndrome: Secondary | ICD-10-CM | POA: Diagnosis not present

## 2015-06-30 DIAGNOSIS — I110 Hypertensive heart disease with heart failure: Secondary | ICD-10-CM | POA: Diagnosis present

## 2015-06-30 DIAGNOSIS — J9601 Acute respiratory failure with hypoxia: Secondary | ICD-10-CM

## 2015-06-30 DIAGNOSIS — G8929 Other chronic pain: Secondary | ICD-10-CM | POA: Diagnosis present

## 2015-06-30 DIAGNOSIS — J9622 Acute and chronic respiratory failure with hypercapnia: Secondary | ICD-10-CM | POA: Diagnosis present

## 2015-06-30 DIAGNOSIS — J9602 Acute respiratory failure with hypercapnia: Secondary | ICD-10-CM | POA: Diagnosis not present

## 2015-06-30 DIAGNOSIS — F419 Anxiety disorder, unspecified: Secondary | ICD-10-CM | POA: Diagnosis present

## 2015-06-30 DIAGNOSIS — Z79899 Other long term (current) drug therapy: Secondary | ICD-10-CM | POA: Diagnosis not present

## 2015-06-30 LAB — POCT I-STAT 3, ART BLOOD GAS (G3+)
ACID-BASE EXCESS: 13 mmol/L — AB (ref 0.0–2.0)
BICARBONATE: 42.3 meq/L — AB (ref 20.0–24.0)
O2 Saturation: 96 %
PH ART: 7.306 — AB (ref 7.350–7.450)
TCO2: 45 mmol/L (ref 0–100)
pCO2 arterial: 84.5 mmHg (ref 35.0–45.0)
pO2, Arterial: 99 mmHg (ref 80.0–100.0)

## 2015-06-30 LAB — BRAIN NATRIURETIC PEPTIDE: B Natriuretic Peptide: 231.8 pg/mL — ABNORMAL HIGH (ref 0.0–100.0)

## 2015-06-30 LAB — PROTIME-INR
INR: 1.02 (ref 0.00–1.49)
Prothrombin Time: 13.6 seconds (ref 11.6–15.2)

## 2015-06-30 LAB — CBC WITH DIFFERENTIAL/PLATELET
BASOS ABS: 0 10*3/uL (ref 0.0–0.1)
BASOS PCT: 0 %
Eosinophils Absolute: 0 10*3/uL (ref 0.0–0.7)
Eosinophils Relative: 0 %
HEMATOCRIT: 34.6 % — AB (ref 36.0–46.0)
HEMOGLOBIN: 10.5 g/dL — AB (ref 12.0–15.0)
Lymphocytes Relative: 1 %
Lymphs Abs: 0.2 10*3/uL — ABNORMAL LOW (ref 0.7–4.0)
MCH: 27.3 pg (ref 26.0–34.0)
MCHC: 30.3 g/dL (ref 30.0–36.0)
MCV: 89.9 fL (ref 78.0–100.0)
Monocytes Absolute: 0 10*3/uL — ABNORMAL LOW (ref 0.1–1.0)
Monocytes Relative: 0 %
NEUTROS ABS: 11.4 10*3/uL — AB (ref 1.7–7.7)
NEUTROS PCT: 98 %
Platelets: 358 10*3/uL (ref 150–400)
RBC: 3.85 MIL/uL — ABNORMAL LOW (ref 3.87–5.11)
RDW: 15.4 % (ref 11.5–15.5)
WBC: 11.6 10*3/uL — ABNORMAL HIGH (ref 4.0–10.5)

## 2015-06-30 LAB — URINALYSIS, ROUTINE W REFLEX MICROSCOPIC
Bilirubin Urine: NEGATIVE
Glucose, UA: >1000 mg/dL — AB
Hgb urine dipstick: NEGATIVE
KETONES UR: 40 mg/dL — AB
LEUKOCYTES UA: NEGATIVE
NITRITE: NEGATIVE
PROTEIN: NEGATIVE mg/dL
Specific Gravity, Urine: 1.021 (ref 1.005–1.030)
pH: 6 (ref 5.0–8.0)

## 2015-06-30 LAB — PHOSPHORUS: PHOSPHORUS: 3 mg/dL (ref 2.5–4.6)

## 2015-06-30 LAB — COMPREHENSIVE METABOLIC PANEL
ALBUMIN: 2.7 g/dL — AB (ref 3.5–5.0)
ALK PHOS: 70 U/L (ref 38–126)
ALT: 15 U/L (ref 14–54)
ANION GAP: 7 (ref 5–15)
AST: 12 U/L — ABNORMAL LOW (ref 15–41)
BUN: 8 mg/dL (ref 6–20)
CALCIUM: 8.9 mg/dL (ref 8.9–10.3)
CO2: 37 mmol/L — AB (ref 22–32)
CREATININE: 0.68 mg/dL (ref 0.44–1.00)
Chloride: 88 mmol/L — ABNORMAL LOW (ref 101–111)
GFR calc Af Amer: >60 mL/min (ref >60)
GFR calc non Af Amer: >60 mL/min (ref >60)
GLUCOSE: 287 mg/dL — AB (ref 65–99)
Potassium: 5.4 mmol/L — ABNORMAL HIGH (ref 3.5–5.1)
SODIUM: 132 mmol/L — AB (ref 135–145)
Total Bilirubin: 0.3 mg/dL (ref 0.3–1.2)
Total Protein: 6.6 g/dL (ref 6.5–8.1)

## 2015-06-30 LAB — MAGNESIUM: Magnesium: 1.9 mg/dL (ref 1.7–2.4)

## 2015-06-30 LAB — URINE MICROSCOPIC-ADD ON

## 2015-06-30 LAB — PROCALCITONIN

## 2015-06-30 LAB — GLUCOSE, CAPILLARY
GLUCOSE-CAPILLARY: 282 mg/dL — AB (ref 65–99)
Glucose-Capillary: 293 mg/dL — ABNORMAL HIGH (ref 65–99)

## 2015-06-30 LAB — LACTIC ACID, PLASMA: LACTIC ACID, VENOUS: 0.8 mmol/L (ref 0.5–2.0)

## 2015-06-30 LAB — CORTISOL: Cortisol, Plasma: 10.3 ug/dL

## 2015-06-30 LAB — MRSA PCR SCREENING: MRSA by PCR: POSITIVE — AB

## 2015-06-30 LAB — APTT: APTT: 30 s (ref 24–37)

## 2015-06-30 MED ORDER — GLUCERNA SHAKE PO LIQD
237.0000 mL | Freq: Two times a day (BID) | ORAL | Status: DC
  Administered 2015-07-02 – 2015-07-06 (×9): 237 mL via ORAL
  Filled 2015-06-30: qty 237

## 2015-06-30 MED ORDER — MUPIROCIN 2 % EX OINT
1.0000 | TOPICAL_OINTMENT | Freq: Two times a day (BID) | CUTANEOUS | Status: AC
Start: 2015-06-30 — End: 2015-07-05
  Administered 2015-06-30 – 2015-07-05 (×10): 1 via NASAL
  Filled 2015-06-30: qty 22

## 2015-06-30 MED ORDER — CETYLPYRIDINIUM CHLORIDE 0.05 % MT LIQD
7.0000 mL | Freq: Two times a day (BID) | OROMUCOSAL | Status: DC
  Administered 2015-07-01 – 2015-07-05 (×10): 7 mL via OROMUCOSAL

## 2015-06-30 MED ORDER — ACETAMINOPHEN 325 MG PO TABS
650.0000 mg | ORAL_TABLET | ORAL | Status: DC | PRN
  Administered 2015-06-30 – 2015-07-05 (×7): 650 mg via ORAL
  Filled 2015-06-30 (×7): qty 2

## 2015-06-30 MED ORDER — ACETAMINOPHEN 500 MG PO TABS: 500.0000 mg | ORAL_TABLET | Freq: Four times a day (QID) | ORAL | Status: DC | PRN

## 2015-06-30 MED ORDER — ATORVASTATIN CALCIUM 10 MG PO TABS
10.0000 mg | ORAL_TABLET | Freq: Every day | ORAL | Status: DC
  Administered 2015-06-30 – 2015-07-05 (×5): 10 mg via ORAL
  Filled 2015-06-30 (×6): qty 1

## 2015-06-30 MED ORDER — ARFORMOTEROL TARTRATE 15 MCG/2ML IN NEBU
15.0000 ug | INHALATION_SOLUTION | Freq: Two times a day (BID) | RESPIRATORY_TRACT | Status: DC
  Filled 2015-06-30 (×2): qty 2

## 2015-06-30 MED ORDER — FLUOXETINE HCL 20 MG PO CAPS
60.0000 mg | ORAL_CAPSULE | Freq: Every day | ORAL | Status: DC
  Administered 2015-06-30 – 2015-07-06 (×7): 60 mg via ORAL
  Filled 2015-06-30 (×7): qty 3

## 2015-06-30 MED ORDER — INSULIN ASPART 100 UNIT/ML ~~LOC~~ SOLN: 0.0000 [IU] | SUBCUTANEOUS | Status: DC

## 2015-06-30 MED ORDER — STARCH (THICKENING) PO POWD
ORAL | Status: DC | PRN
  Administered 2015-07-02 (×3): via ORAL
  Filled 2015-06-30: qty 227

## 2015-06-30 MED ORDER — INSULIN DETEMIR 100 UNIT/ML ~~LOC~~ SOLN
15.0000 [IU] | Freq: Every day | SUBCUTANEOUS | Status: DC
  Filled 2015-06-30: qty 0.15

## 2015-06-30 MED ORDER — DIAZEPAM 5 MG PO TABS
5.0000 mg | ORAL_TABLET | Freq: Two times a day (BID) | ORAL | Status: DC | PRN
  Administered 2015-06-30: 5 mg via ORAL
  Filled 2015-06-30 (×2): qty 1

## 2015-06-30 MED ORDER — LORATADINE 10 MG PO TABS
10.0000 mg | ORAL_TABLET | Freq: Every day | ORAL | Status: DC
  Administered 2015-06-30 – 2015-07-06 (×6): 10 mg via ORAL
  Filled 2015-06-30 (×7): qty 1

## 2015-06-30 MED ORDER — OLANZAPINE 10 MG PO TABS
10.0000 mg | ORAL_TABLET | Freq: Every day | ORAL | Status: DC
  Administered 2015-06-30 – 2015-07-05 (×6): 10 mg via ORAL
  Filled 2015-06-30 (×7): qty 1

## 2015-06-30 MED ORDER — SODIUM CHLORIDE 0.9 % IV SOLN
250.0000 mL | INTRAVENOUS | Status: DC | PRN
  Administered 2015-06-30: 250 mL via INTRAVENOUS

## 2015-06-30 MED ORDER — FAMOTIDINE 20 MG PO TABS
20.0000 mg | ORAL_TABLET | Freq: Every day | ORAL | Status: DC
  Administered 2015-06-30 – 2015-07-06 (×7): 20 mg via ORAL
  Filled 2015-06-30 (×7): qty 1

## 2015-06-30 MED ORDER — FUROSEMIDE 10 MG/ML IJ SOLN
40.0000 mg | Freq: Once | INTRAMUSCULAR | Status: AC
  Administered 2015-06-30: 40 mg via INTRAVENOUS
  Filled 2015-06-30: qty 4

## 2015-06-30 MED ORDER — LISINOPRIL 40 MG PO TABS
40.0000 mg | ORAL_TABLET | Freq: Every day | ORAL | Status: DC
  Administered 2015-06-30 – 2015-07-06 (×7): 40 mg via ORAL
  Filled 2015-06-30 (×7): qty 1

## 2015-06-30 MED ORDER — CHLORHEXIDINE GLUCONATE 0.12 % MT SOLN
15.0000 mL | Freq: Two times a day (BID) | OROMUCOSAL | Status: DC
  Administered 2015-06-30 – 2015-07-06 (×12): 15 mL via OROMUCOSAL
  Filled 2015-06-30 (×7): qty 15

## 2015-06-30 MED ORDER — SENNOSIDES-DOCUSATE SODIUM 8.6-50 MG PO TABS
1.0000 | ORAL_TABLET | Freq: Every day | ORAL | Status: DC
  Administered 2015-06-30 – 2015-07-06 (×6): 1 via ORAL
  Filled 2015-06-30 (×7): qty 1

## 2015-06-30 MED ORDER — IPRATROPIUM-ALBUTEROL 0.5-2.5 (3) MG/3ML IN SOLN
3.0000 mL | RESPIRATORY_TRACT | Status: DC
  Administered 2015-06-30 – 2015-07-03 (×19): 3 mL via RESPIRATORY_TRACT
  Filled 2015-06-30 (×19): qty 3

## 2015-06-30 MED ORDER — HEPARIN SODIUM (PORCINE) 5000 UNIT/ML IJ SOLN
5000.0000 [IU] | Freq: Three times a day (TID) | INTRAMUSCULAR | Status: DC
Start: 2015-06-30 — End: 2015-07-07
  Administered 2015-06-30 – 2015-07-06 (×17): 5000 [IU] via SUBCUTANEOUS
  Filled 2015-06-30 (×17): qty 1

## 2015-06-30 MED ORDER — INSULIN ASPART 100 UNIT/ML ~~LOC~~ SOLN
0.0000 [IU] | SUBCUTANEOUS | Status: DC
  Administered 2015-06-30 (×2): 12 [IU] via SUBCUTANEOUS
  Administered 2015-07-01: 2 [IU] via SUBCUTANEOUS

## 2015-06-30 MED ORDER — PREDNISONE 20 MG PO TABS
40.0000 mg | ORAL_TABLET | Freq: Every day | ORAL | Status: DC
  Administered 2015-07-02 – 2015-07-05 (×4): 40 mg via ORAL
  Filled 2015-06-30 (×6): qty 2

## 2015-06-30 MED ORDER — BUDESONIDE 0.25 MG/2ML IN SUSP
0.2500 mg | Freq: Two times a day (BID) | RESPIRATORY_TRACT | Status: DC
  Filled 2015-06-30 (×2): qty 2

## 2015-06-30 MED ORDER — CHLORHEXIDINE GLUCONATE CLOTH 2 % EX PADS
6.0000 | MEDICATED_PAD | Freq: Every day | CUTANEOUS | Status: AC
  Administered 2015-07-01 – 2015-07-05 (×5): 6 via TOPICAL

## 2015-06-30 MED ORDER — DILTIAZEM HCL 30 MG PO TABS
45.0000 mg | ORAL_TABLET | Freq: Four times a day (QID) | ORAL | Status: DC
  Administered 2015-06-30 – 2015-07-06 (×23): 45 mg via ORAL
  Filled 2015-06-30: qty 1.5
  Filled 2015-06-30: qty 2
  Filled 2015-06-30 (×5): qty 0.5
  Filled 2015-06-30 (×2): qty 2
  Filled 2015-06-30: qty 0.5
  Filled 2015-06-30 (×2): qty 2
  Filled 2015-06-30: qty 0.5
  Filled 2015-06-30: qty 2
  Filled 2015-06-30 (×2): qty 0.5
  Filled 2015-06-30 (×3): qty 2
  Filled 2015-06-30 (×2): qty 0.5
  Filled 2015-06-30 (×2): qty 2
  Filled 2015-06-30: qty 0.5
  Filled 2015-06-30: qty 2
  Filled 2015-06-30 (×2): qty 0.5
  Filled 2015-06-30: qty 2

## 2015-06-30 NOTE — H&P (Signed)
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  This is her 4rth hospitalization in the last 21months for respiratory issues. She states her symptoms started this AM, she was wheezing and tried her nebulizer without improvement. Her husband brought her to the ED.  Current she is still SOB, the bipap is helping but she feels tired.  Per reports she has a history of dysphagia with microaspiration.   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  has past surgical history that includes Cesarean section (1971); Abdominal hysterectomy (1980); Colectomy (07/1999); rod in left leg; Cholecystectomy; Endoscopic retrograde cholangiopancreatography (ercp) with propofol (11/06/2004); Esophagogastroduodenoscopy (09/1999); Colonoscopy (05/1999); Colonoscopy (01/13/2012); Cataract extraction w/PHACO (Left, 08/02/2013); and Cataract extraction w/PHACO (Right, 12/20/2013). Prior to Admission medications   Medication Sig Start Date End Date Taking?  Authorizing Provider  atorvastatin (LIPITOR) 10 MG tablet Take 10 mg by mouth daily.   Yes Historical Provider, MD  cetirizine (ZYRTEC) 10 MG tablet Take 10 mg by mouth at bedtime.    Yes Historical Provider, MD  diazepam (VALIUM) 5 MG tablet Take 5 mg by mouth 2 (two) times daily as needed for anxiety.    Yes Historical Provider, MD  ergocalciferol (VITAMIN D2) 50000 units capsule Take 50,000 Units by mouth once a week.   Yes Historical Provider, MD  FLUoxetine (PROZAC) 20 MG capsule Take 60 mg by mouth daily.    Yes Historical Provider, MD  lisinopril (PRINIVIL,ZESTRIL) 40 MG tablet Take 40 mg by mouth daily.   Yes Historical Provider, MD  metFORMIN (GLUCOPHAGE) 500 MG tablet Take 500 mg by mouth 2 (two) times daily with a meal.   Yes Historical Provider, MD  omeprazole (PRILOSEC) 20 MG capsule Take 20 mg by mouth daily.     Yes Historical Provider, MD  acetaminophen (TYLENOL) 500 MG tablet Take 500 mg by mouth every 6 (six) hours as needed for mild pain.    Historical Provider, MD  albuterol (PROVENTIL) (2.5 MG/3ML) 0.083% nebulizer solution Take 2.5 mg by nebulization daily as needed for wheezing or shortness of breath.    Historical Provider, MD  budesonide-formoterol (SYMBICORT) 160-4.5 MCG/ACT inhaler Inhale 2 puffs into the lungs 2 (two) times daily.    Historical Provider, MD  calcium carbonate (TUMS - DOSED IN MG ELEMENTAL CALCIUM) 500 MG chewable tablet Chew 1 tablet by mouth 2 (two) times daily.    Historical Provider, MD  diltiazem (CARDIZEM) 90 MG tablet Take 45 mg by mouth 4 (four) times daily.    Historical Provider, MD  famotidine (PEPCID) 20 MG tablet Take 20 mg by mouth daily.    Historical Provider,  MD  feeding supplement, GLUCERNA SHAKE, (GLUCERNA SHAKE) LIQD Take 237 mLs by mouth 2 (two) times daily between meals.    Historical Provider, MD  food thickener (THICK IT) POWD Thicken liquids as described by nutrition 06/16/15   Asiyah Cletis Media, MD  furosemide (LASIX) 20 MG  tablet Take 20 mg by mouth daily.    Historical Provider, MD  ipratropium-albuterol (DUONEB) 0.5-2.5 (3) MG/3ML SOLN Take 3 mLs by nebulization every 6 (six) hours.    Historical Provider, MD  OLANZapine (ZYPREXA) 10 MG tablet Take 10 mg by mouth at bedtime.      Historical Provider, MD  oxyCODONE (OXY IR/ROXICODONE) 5 MG immediate release tablet Take 1 tablet (5 mg total) by mouth every 4 (four) hours as needed for moderate pain or severe pain. 06/16/15   Asiyah Cletis Media, MD  senna-docusate (SENOKOT-S) 8.6-50 MG tablet Take 1 tablet by mouth daily.    Historical Provider, MD  temazepam (RESTORIL) 15 MG capsule Take 15 mg by mouth at bedtime as needed for sleep.    Historical Provider, MD  tiotropium (SPIRIVA) 18 MCG inhalation capsule Place 18 mcg into inhaler and inhale daily.      Historical Provider, MD   Allergies  Allergen Reactions  . Aspirin Other (See Comments)    History of gastric ulcers  . Nsaids     Hx of gastric ulcers    FAMILY HISTORY:  has no family status information on file.  SOCIAL HISTORY:  reports that she quit smoking about 5 years ago. Her smoking use included Cigarettes. She smoked 0.50 packs per day. She does not have any smokeless tobacco history on file. She reports that she does not drink alcohol or use illicit drugs.  REVIEW OF SYSTEMS:   + for Nausea, diarrhea, SOB, cough, no mucus production, Right arm pain, heat/cold intolerance.  - for Fevers, chills, chest pain, vomiting.  SUBJECTIVE:   VITAL SIGNS: Pulse Rate:  [99] 99 (05/26 1936) Resp:  [34] 34 (05/26 1936) SpO2:  [95 %] 95 % (05/26 1936) Weight:  [67.5 kg (148 lb 13 oz)] 67.5 kg (148 lb 13 oz) (05/26 1945) HEMODYNAMICS:   VENTILATOR SETTINGS:   INTAKE / OUTPUT: No intake or output data in the 24 hours ending 06/30/15 2022  PHYSICAL EXAMINATION: General:  Mild respiratory distress, Neuro:  CN II-XII, PERRLA, + asterixis  HEENT:  NCAT Cardiovascular:  Tachycardic, regular, no  m/r/g Lungs:  Course sounds b/l, no wheezing, + bibasilar rales Abdomen:  LUQ TTP, Normal bowel sounds, no rebound/gaurding.  Musculoskeletal:  Normal bulk and tone Skin:  No c/c/e  LABS:  CBC No results for input(s): WBC, HGB, HCT, PLT in the last 168 hours. Coag's No results for input(s): APTT, INR in the last 168 hours. BMET No results for input(s): NA, K, CL, CO2, BUN, CREATININE, GLUCOSE in the last 168 hours. Electrolytes No results for input(s): CALCIUM, MG, PHOS in the last 168 hours. Sepsis Markers No results for input(s): LATICACIDVEN, PROCALCITON, O2SATVEN in the last 168 hours. ABG No results for input(s): PHART, PCO2ART, PO2ART in the last 168 hours. Liver Enzymes No results for input(s): AST, ALT, ALKPHOS, BILITOT, ALBUMIN in the last 168 hours. Cardiac Enzymes No results for input(s): TROPONINI, PROBNP in the last 168 hours. Glucose No results for input(s): GLUCAP in the last 168 hours.  Imaging No results found.   ASSESSMENT / PLAN:  PULMONARY A: Acute on chronic hypercapneic respiratory failure COPD Pulmonary edema P:   - BiPaP - ABG  improving - duonebs Q6h - Pred 40mg  PO Qday x5 - Azithromycin  CARDIOVASCULAR A:  CHF exacerbation P:  - diuresis net neg 1-2LQday  RENAL A:   HyperK P:   - insulin - lasix  GASTROINTESTINAL A:   LUQ pain P:   - unclear etiology, lactate normal  HEMATOLOGIC A:   Anemia of chronic disease P:  - transfusion goal <7  INFECTIOUS A:   Non suspected P:   BCx2 07/01/15 UC 5/27 Abx: CTX,   ENDOCRINE A:   DKA - closed AG at this time   P:   - levemir 15U now - SSI  NEUROLOGIC A:   Not active P:    NPO Partial Code PPx: Heparin  Total critical care time: 30 min  Critical care time was exclusive of separately billable procedures and treating other patients.  Critical care was necessary to treat or prevent imminent or life-threatening deterioration.  Critical care was time spent  personally by me on the following activities: development of treatment plan with patient and/or surrogate as well as nursing, discussions with consultants, evaluation of patient's response to treatment, examination of patient, obtaining history from patient or surrogate, ordering and performing treatments and interventions, ordering and review of laboratory studies, ordering and review of radiographic studies, pulse oximetry and re-evaluation of patient's condition.   Meribeth Mattes, DO., MS [facility] [facility]    Pulmonary and [place] Pager: [phone]  06/30/2015, 8:22 PM

## 2015-07-01 ENCOUNTER — Inpatient Hospital Stay (HOSPITAL_COMMUNITY): Payer: Medicare Other

## 2015-07-01 DIAGNOSIS — J9601 Acute respiratory failure with hypoxia: Secondary | ICD-10-CM

## 2015-07-01 DIAGNOSIS — J9602 Acute respiratory failure with hypercapnia: Secondary | ICD-10-CM

## 2015-07-01 DIAGNOSIS — R410 Disorientation, unspecified: Secondary | ICD-10-CM

## 2015-07-01 DIAGNOSIS — E162 Hypoglycemia, unspecified: Secondary | ICD-10-CM

## 2015-07-01 LAB — GLUCOSE, CAPILLARY
GLUCOSE-CAPILLARY: 158 mg/dL — AB (ref 65–99)
GLUCOSE-CAPILLARY: 117 mg/dL — AB (ref 65–99)
GLUCOSE-CAPILLARY: 170 mg/dL — AB (ref 65–99)
Glucose-Capillary: 130 mg/dL — ABNORMAL HIGH (ref 65–99)
Glucose-Capillary: 133 mg/dL — ABNORMAL HIGH (ref 65–99)

## 2015-07-01 LAB — POCT I-STAT 3, ART BLOOD GAS (G3+)
ACID-BASE EXCESS: 16 mmol/L — AB (ref 0.0–2.0)
Acid-Base Excess: 14 mmol/L — ABNORMAL HIGH (ref 0.0–2.0)
BICARBONATE: 42.5 meq/L — AB (ref 20.0–24.0)
Bicarbonate: 44.2 mEq/L — ABNORMAL HIGH (ref 20.0–24.0)
O2 Saturation: 97 %
O2 Saturation: 95 %
PCO2 ART: 75.7 mmHg — AB (ref 35.0–45.0)
PH ART: 7.399 (ref 7.350–7.450)
TCO2: 45 mmol/L (ref 0–100)
TCO2: 46 mmol/L (ref 0–100)
pCO2 arterial: 71.6 mmHg (ref 35.0–45.0)
pH, Arterial: 7.357 (ref 7.350–7.450)
pO2, Arterial: 103 mmHg — ABNORMAL HIGH (ref 80.0–100.0)
pO2, Arterial: 78 mmHg — ABNORMAL LOW (ref 80.0–100.0)

## 2015-07-01 LAB — BASIC METABOLIC PANEL
Anion gap: 7 (ref 5–15)
BUN: 9 mg/dL (ref 6–20)
CALCIUM: 8.9 mg/dL (ref 8.9–10.3)
CO2: 40 mmol/L — ABNORMAL HIGH (ref 22–32)
CREATININE: 0.63 mg/dL (ref 0.44–1.00)
Chloride: 88 mmol/L — ABNORMAL LOW (ref 101–111)
GFR calc Af Amer: >60 mL/min (ref >60)
GLUCOSE: 126 mg/dL — AB (ref 65–99)
POTASSIUM: 4.6 mmol/L (ref 3.5–5.1)
SODIUM: 135 mmol/L (ref 135–145)

## 2015-07-01 LAB — CBC
HCT: 32.3 % — ABNORMAL LOW (ref 36.0–46.0)
Hemoglobin: 9.7 g/dL — ABNORMAL LOW (ref 12.0–15.0)
MCH: 26.6 pg (ref 26.0–34.0)
MCHC: 30 g/dL (ref 30.0–36.0)
MCV: 88.5 fL (ref 78.0–100.0)
PLATELETS: 351 10*3/uL (ref 150–400)
RBC: 3.65 MIL/uL — AB (ref 3.87–5.11)
RDW: 15.4 % (ref 11.5–15.5)
WBC: 10.6 10*3/uL — ABNORMAL HIGH (ref 4.0–10.5)

## 2015-07-01 MED ORDER — DEXTROSE 5 % IV SOLN
1.0000 g | INTRAVENOUS | Status: DC
  Administered 2015-07-01: 1 g via INTRAVENOUS
  Filled 2015-07-01: qty 10

## 2015-07-01 MED ORDER — HALOPERIDOL LACTATE 5 MG/ML IJ SOLN
2.0000 mg | Freq: Once | INTRAMUSCULAR | Status: AC
  Administered 2015-07-01: 2 mg via INTRAVENOUS

## 2015-07-01 MED ORDER — INSULIN DETEMIR 100 UNIT/ML ~~LOC~~ SOLN
15.0000 [IU] | Freq: Every day | SUBCUTANEOUS | Status: DC
  Administered 2015-07-02 – 2015-07-06 (×5): 15 [IU] via SUBCUTANEOUS
  Filled 2015-07-01 (×7): qty 0.15

## 2015-07-01 MED ORDER — INSULIN ASPART 100 UNIT/ML ~~LOC~~ SOLN
0.0000 [IU] | SUBCUTANEOUS | Status: DC
Start: 2015-07-01 — End: 2015-07-02
  Administered 2015-07-01: 1 [IU] via SUBCUTANEOUS
  Administered 2015-07-01: 2 [IU] via SUBCUTANEOUS

## 2015-07-01 MED ORDER — INSULIN ASPART 100 UNIT/ML ~~LOC~~ SOLN: 0.0000 [IU] | SUBCUTANEOUS | Status: DC

## 2015-07-01 NOTE — Progress Notes (Signed)
Pt alert, agitated, oriented to person, place, and year. But some things she says do not make sense. She is trying to "get up to pee". Foley is in place, which is explained to her and educated. She states: I do not believe you and trying to pull it out. MD at the bed side. Pt belligerent, states she "does not have oxygen on". MD, nurse verified and showed her it does. States: "I do not believe you. You just want to kill me". Emotional support was given. Family came and at the bedside at this moment. Haldol given. Pt still agitated but less than prior. Family seems in emotional stress, tearful. Emotional support given to both, patient and family.

## 2015-07-01 NOTE — Progress Notes (Signed)
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  This is her 4rth hospitalization in the last 75months for respiratory issues. She states her symptoms started this AM, she was wheezing and tried her nebulizer without improvement. Her husband brought her to the ED.  Current she is still SOB, the bipap is helping but she feels tired. Per reports she has a history of dysphagia with microaspiration.  OSH Labs: ABG: 7.25/103/106, Scr 0.53, Bicarb 38, WBC 16, H/H 11/38, platelets 428, Trop Negative, pBNP = 1287  SUBJECTIVE: Patient more awake this morning but still somewhat delirious. Denies any chest pain or pressure. Denies any difficulty breathing.  REVIEW OF SYSTEMS:  Unable to obtain given altered mentation.  VITAL SIGNS: Temp:  [97.9 F (36.6 C)-98.6 F (37 C)] 97.9 F (36.6 C) (05/27 0322) Pulse Rate:  [87-100] 87 (05/27 0600) Resp:  [16-34] 20 (05/27 0600) BP: (114-146)/(58-71) 114/58 mmHg (05/27 0600) SpO2:  [91 %-100 %] 100 % (05/27 0600) FiO2 (%):  [45 %] 45 % (05/27 0329) Weight:  [148 lb 13 oz (67.5 kg)] 148 lb 13 oz (67.5 kg) (05/27 0400) HEMODYNAMICS:   VENTILATOR SETTINGS: Vent Mode:  [-]  FiO2 (%):  [45 %] 45 % INTAKE / OUTPUT:  Intake/Output Summary (Last 24 hours) at 07/01/15 0739 Last data filed at 07/01/15 0600  Gross per 24 hour  Intake 141.67 ml  Output   1525 ml  Net -1383.33 ml    PHYSICAL EXAMINATION: General:  No distress. Awake. On BiPAP. Neuro:  Grossly nonfocal moving all 4 extremities. Delirious but knows year, president &  location. HEENT:  Moist membranes. No scleral injection or icterus. Cardiovascular:  Tachycardic. Regular rhythm. No edema. Lungs:  Clear bilaterally on auscultation. Normal work of breathing on [place] oxygen. Abdomen:  Soft. Normal bowel sounds. Nontender. Integument:  Warm & dry. No rash on exposed skin. Bandage over bridge of nose.  LABS:  CBC  Recent Labs Lab 06/30/15 2049 07/01/15 0711  WBC 11.6* 10.6*  HGB 10.5* 9.7*  HCT 34.6* 32.3*  PLT 358 351   Coag's  Recent Labs Lab 06/30/15 2049  APTT 30  INR 1.02   BMET  Recent Labs Lab 06/30/15 2049  NA 132*  K 5.4*  CL 88*  CO2 37*  BUN 8  CREATININE 0.68  GLUCOSE 287*   Electrolytes  Recent Labs Lab 06/30/15 2049  CALCIUM 8.9  MG 1.9  PHOS 3.0   Sepsis Markers  Recent Labs Lab 06/30/15 2049  LATICACIDVEN 0.8  PROCALCITON <0.10   ABG  Recent Labs Lab 06/30/15 2037 07/01/15 0111 07/01/15 0342  PHART 7.306* 7.357 7.399  PCO2ART 84.5* 75.7* 71.6*  PO2ART 99.0 103.0* 78.0*   Liver Enzymes  Recent Labs Lab 06/30/15 2049  AST 12*  ALT 15  ALKPHOS 70  BILITOT 0.3  ALBUMIN 2.7*   Cardiac Enzymes No results for input(s): TROPONINI, PROBNP in the last 168 hours. Glucose  Recent Labs Lab 06/30/15 2023 06/30/15 2333  GLUCAP 282* 293*    Imaging Ct Head Wo Contrast  07/01/2015  CLINICAL DATA:  Acute onset of altered mental status. Initial encounter. EXAM: CT HEAD WITHOUT CONTRAST TECHNIQUE: Contiguous axial images were obtained from the base of the skull through the vertex without intravenous contrast. COMPARISON:  None. FINDINGS: There is no evidence of acute infarction, mass lesion, or intra- or extra-axial hemorrhage on CT. Mild periventricular white matter change likely reflects small vessel ischemic microangiopathy. The posterior fossa, including the cerebellum, brainstem and fourth ventricle, is within normal limits. The third and lateral ventricles, and basal ganglia are unremarkable  in appearance. The cerebral hemispheres are symmetric in appearance, with normal gray-white differentiation. No mass effect or midline shift is seen. There is no evidence of fracture; visualized osseous structures are unremarkable in appearance. The orbits are within normal limits. There is partial opacification of the mastoid air cells bilaterally. The paranasal sinuses are well-aerated. No significant soft tissue abnormalities are seen. IMPRESSION: 1. No acute intracranial pathology seen on CT. 2. Mild small vessel ischemic microangiopathy. 3. Partial opacification of the mastoid air cells bilaterally. Electronically Signed   By: Garald Balding M.D.   On: 07/01/2015 02:11   Dg Chest Port 1 View  07/01/2015  CLINICAL DATA:  Follow-up congestive heart failure. Patient on BiPAP. Initial encounter. EXAM: PORTABLE CHEST 1 VIEW COMPARISON:  Chest radiograph performed earlier today at 12:57 p.m. FINDINGS: The lungs are well-aerated. Vascular congestion is noted. Mildly increased interstitial markings may reflect mild interstitial edema or possibly pneumonia, slightly more prominent than on the prior study. No pleural effusion or pneumothorax is seen. The cardiomediastinal silhouette is borderline normal in size. No acute osseous abnormalities are seen. There is a chronic fracture through the right humeral head, with associated bony remodeling. IMPRESSION: 1. Vascular congestion noted. Mildly increased interstitial markings may reflect mild interstitial edema or possibly pneumonia, slightly more prominent than on the prior study. 2. Chronic fracture through the right humeral head, with associated bony remodeling. Electronically Signed   By: Garald Balding M.D.   On: 07/01/2015 01:55   EVENTS: 5/26 - Admit to hospital after transfer from [facility] ED  STUDIES: TTE 5/9:  EF 50-55%. Grade 1 diastolic dysfunction. RV mildly dilated. PASP 58mmHg. Port CXR 5/26:  Vascular congestion w/ increased interstitial  markings. Silhouetting of left hemidiaphragm. CT HEAD W/O 5/27:  No acute pathology. Small vessel ischemic changes.  MICROBIOLOGY: Blood Ctx 5/26 >> Urine Ctx 5/26 >> MRSA PCR 5/26:  Positive  ANTIBIOTICS: Rocephin 5/27   LINES/TUBES: Foley 5/26 >> PIV x2  ASSESSMENT / PLAN:  PULMONARY A: Acute on Chronic Hypercapneic Respiratory Failure H/O COPD/Emphysema on imaging  P:   NIVPPV w/ BiPAP intermittently for WOB Duoneb q4hr Prednisone 40mg  po daily D/C [place] & Budesonide  CARDIOVASCULAR A:  Possible CHF Exacerbation H/O HTN  P:  Monitor on telemetry Vitals per unit Protocol Lipitor 10mg  QD, Cardizem 45mg  QID, Lisinopril 40mg  QD  RENAL A:   Hyperkalemia - Mild. Hyponatremia - Mild.  P:   Trending UOP Trending electrolytes & renal function daily. BMP now  GASTROINTESTINAL A:   LUQ pain H/O GERD H/O Hiatal Hernia  P:   Pepcid PO daily NPO  HEMATOLOGIC A:   Anemia - Chronic disease. Hgb 1.6 on 5/12.  P:  Trending cell counts daily w/ CBC Transfuse for Hgb <7 Heparin [place] q8hr SCDs  INFECTIOUS A:   No acute issues. (PCT <0.1)  P:   Awaiting blood & urine cultures D/C Rocephin Monitor for fever  ENDOCRINE A:   DKA - closed AG at  this time. Hypoglycemia - Resolved. H/O DM Type 2  P:   Levemir 15u [place] daily Accu-Checks q4hr SSI per algorithm  NEUROLOGIC A:   Acute Encephalopathy - Likely delirium - Multifactorial with hypoglycemia & hypercarbia. H/O Anxiety/Depression H/O Delirium  P:   Monitor closely. Prozac 60mg  QD Zyprexa 10mg  QHS  FAMILY UPDATES:  Niece updated at bedside 5/27 by Dr. Ashok Cordia.  TODAY'S SUMMARY: 70 year old female with underlying emphysema, chronic hypoxic respiratory failure, and chronic hypercarbic respiratory failure. Presented to outside hospital with altered mentation, acute on chronic hypercarbia, and status post fall. Patient did have hypoglycemia at outside hospital likely precipitated these events.  Continuing to clinically improve with regards to mentation although still delirious. Monitoring respiratory and mental status closely. Continuing to utilize BiPAP as needed for increased somnolence or breathing.  I have spent a total of 33 minutes of critical care time today caring for the patient, discussing the plan of care with family at bedside, and reviewing the patient's electronic medical record.  Sonia Baller Ashok Cordia, M.D. [facility] Pulmonary & Critical Care Pager:  [phone] After 3pm or if no response, call [phone] 7:40 AM 07/01/2015

## 2015-07-01 NOTE — Progress Notes (Signed)
[facility] Progress Note Patient Name: [name] DOB: Sep 18, 1945 MRN: [id number]   Date of Service  07/01/2015  HPI/Events of Note  ABG on BiPAP = 7.35/75/103. This is very close to her baseline pCO2  eICU Interventions  Will order: 1. D/C Valium. 2. Head CT Scan w/o contrast STAT.     Intervention Category Major Interventions: Change in mental status - evaluation and management  Kalani Baray Eugene 07/01/2015, 1:20 AM

## 2015-07-01 NOTE — Progress Notes (Signed)
[facility] Progress Note Patient Name: CAIRA LEMMON DOB: 06/09/1945 MRN: [id number]   Date of Service  07/01/2015  HPI/Events of Note  Lethargy - s/p Valium. Patient on BiPAP. Remote Hx of fall and has bruise on her head per bedside nurse.   eICU Interventions  Will order: 1. ABG STAT.  If pCO2 increased from baseline >> intubate. If pCO2 at baseline >> Head CT Scan STAT and D/C Valium.     Intervention Category Major Interventions: Change in mental status - evaluation and management  Rondall Radigan Eugene 07/01/2015, 12:53 AM

## 2015-07-02 DIAGNOSIS — G894 Chronic pain syndrome: Secondary | ICD-10-CM

## 2015-07-02 LAB — GLUCOSE, CAPILLARY
GLUCOSE-CAPILLARY: 111 mg/dL — AB (ref 65–99)
GLUCOSE-CAPILLARY: 115 mg/dL — AB (ref 65–99)
GLUCOSE-CAPILLARY: 291 mg/dL — AB (ref 65–99)
GLUCOSE-CAPILLARY: 310 mg/dL — AB (ref 65–99)
Glucose-Capillary: 120 mg/dL — ABNORMAL HIGH (ref 65–99)
Glucose-Capillary: 297 mg/dL — ABNORMAL HIGH (ref 65–99)
Glucose-Capillary: 319 mg/dL — ABNORMAL HIGH (ref 65–99)
Glucose-Capillary: 243 mg/dL — ABNORMAL HIGH (ref 65–99)

## 2015-07-02 LAB — RENAL FUNCTION PANEL
ALBUMIN: 2.5 g/dL — AB (ref 3.5–5.0)
ANION GAP: 6 (ref 5–15)
BUN: 7 mg/dL (ref 6–20)
CO2: 42 mmol/L — ABNORMAL HIGH (ref 22–32)
Calcium: 9 mg/dL (ref 8.9–10.3)
Chloride: 90 mmol/L — ABNORMAL LOW (ref 101–111)
Creatinine, Ser: 0.71 mg/dL (ref 0.44–1.00)
GFR calc non Af Amer: >60 mL/min (ref >60)
GLUCOSE: 109 mg/dL — AB (ref 65–99)
PHOSPHORUS: 3 mg/dL (ref 2.5–4.6)
POTASSIUM: 3.9 mmol/L (ref 3.5–5.1)
Sodium: 138 mmol/L (ref 135–145)

## 2015-07-02 LAB — CBC WITH DIFFERENTIAL/PLATELET
BASOS ABS: 0 10*3/uL (ref 0.0–0.1)
Basophils Relative: 0 %
EOS PCT: 4 %
Eosinophils Absolute: 0.3 10*3/uL (ref 0.0–0.7)
HCT: 32 % — ABNORMAL LOW (ref 36.0–46.0)
Hemoglobin: 9.5 g/dL — ABNORMAL LOW (ref 12.0–15.0)
LYMPHS PCT: 20 %
Lymphs Abs: 1.5 10*3/uL (ref 0.7–4.0)
MCH: 27.1 pg (ref 26.0–34.0)
MCHC: 29.7 g/dL — ABNORMAL LOW (ref 30.0–36.0)
MCV: 91.4 fL (ref 78.0–100.0)
Monocytes Absolute: 0.6 10*3/uL (ref 0.1–1.0)
Monocytes Relative: 7 %
NEUTROS ABS: 5.1 10*3/uL (ref 1.7–7.7)
Neutrophils Relative %: 69 %
PLATELETS: 367 10*3/uL (ref 150–400)
RBC: 3.5 MIL/uL — AB (ref 3.87–5.11)
RDW: 15.7 % — ABNORMAL HIGH (ref 11.5–15.5)
WBC: 7.6 10*3/uL (ref 4.0–10.5)

## 2015-07-02 LAB — URINE CULTURE: Culture: NO GROWTH

## 2015-07-02 LAB — MAGNESIUM: Magnesium: 2 mg/dL (ref 1.7–2.4)

## 2015-07-02 MED ORDER — HYDROCODONE-ACETAMINOPHEN 5-325 MG PO TABS
1.0000 | ORAL_TABLET | Freq: Four times a day (QID) | ORAL | Status: DC | PRN
  Administered 2015-07-02 – 2015-07-05 (×10): 1 via ORAL
  Filled 2015-07-02 (×10): qty 1

## 2015-07-02 MED ORDER — INSULIN ASPART 100 UNIT/ML ~~LOC~~ SOLN
0.0000 [IU] | Freq: Three times a day (TID) | SUBCUTANEOUS | Status: DC
  Administered 2015-07-02: 7 [IU] via SUBCUTANEOUS
  Administered 2015-07-02: 5 [IU] via SUBCUTANEOUS
  Administered 2015-07-03: 1 [IU] via SUBCUTANEOUS
  Administered 2015-07-03: 2 [IU] via SUBCUTANEOUS
  Administered 2015-07-03 – 2015-07-04 (×2): 5 [IU] via SUBCUTANEOUS
  Administered 2015-07-04: 1 [IU] via SUBCUTANEOUS
  Administered 2015-07-04: 3 [IU] via SUBCUTANEOUS

## 2015-07-02 MED ORDER — INSULIN ASPART 100 UNIT/ML ~~LOC~~ SOLN
0.0000 [IU] | Freq: Every day | SUBCUTANEOUS | Status: DC
  Administered 2015-07-02 – 2015-07-03 (×2): 2 [IU] via SUBCUTANEOUS

## 2015-07-02 NOTE — Progress Notes (Addendum)
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  This is her 4rth hospitalization in the last 54months for respiratory issues. She states her symptoms started this AM, she was wheezing and tried her nebulizer without improvement. Her husband brought her to the ED.  Current she is still SOB, the bipap is helping but she feels tired. Per reports she has a history of dysphagia with microaspiration.  OSH Labs: ABG: 7.25/103/106, Scr 0.53, Bicarb 38, WBC 16, H/H 11/38, platelets 428, Trop Negative, pBNP = 1287  SUBJECTIVE: No acute events overnight. Continuing to use BiPAP prn. Patient reports dyspnea and has just transitioned to BiPAP. Denies audible wheezing. No chest pain or pressure.  REVIEW OF SYSTEMS:  No nausea or emesis. No fever or chills.  VITAL SIGNS: Temp:  [98.3 F (36.8 C)-98.6 F (37 C)] 98.6 F (37 C) (05/28 0832) Pulse Rate:  [77-112] 94 (05/28 0715) Resp:  [19-34] 20 (05/28 0715) BP: (97-164)/(56-99) 151/76 mmHg (05/28 0715) SpO2:  [77 %-100 %] 97 % (05/28 0715) FiO2 (%):  [30 %-50 %] 40 % (05/28 0715) Weight:  [152 lb 1.9 oz (69 kg)] 152 lb 1.9 oz (69 kg) (05/28 0500) HEMODYNAMICS:   VENTILATOR SETTINGS: Vent Mode:  [-]  FiO2 (%):  [30 %-50 %] 40 % INTAKE / OUTPUT:  Intake/Output Summary (Last 24 hours) at 07/02/15 0952 Last data filed at 07/02/15 0500  Gross per 24 hour  Intake     90 ml  Output   1950 ml  Net  -1860 ml    PHYSICAL EXAMINATION: General:  No distress. Awake. Alert. Neuro:  Grossly nonfocal moving all 4 extremities. Oriented x4.  Pleasant & cooperative today. HEENT:  Moist membranes. No scleral injection. Cardiovascular:  Sinus rhythm. Regular rate. No edema. Lungs:  Clear bilaterally on auscultation. Normal work of breathing on BiPAP. Abdomen:  Soft. Normal bowel sounds. Nontender. Integument:  Warm & dry. No rash on exposed skin. Bandage over bridge of nose.  LABS:  CBC  Recent Labs Lab 06/30/15 2049 07/01/15 0711 07/02/15 0241  WBC 11.6* 10.6* 7.6  HGB 10.5* 9.7* 9.5*  HCT 34.6* 32.3* 32.0*  PLT 358 351 367   Coag's  Recent Labs Lab 06/30/15 2049  APTT 30  INR 1.02   BMET  Recent Labs Lab 06/30/15 2049 07/01/15 0711 07/02/15 0241  NA 132* 135 138  K 5.4* 4.6 3.9  CL 88* 88* 90*  CO2 37* 40* 42*  BUN 8 9 7   CREATININE 0.68 0.63 0.71  GLUCOSE 287* 126* 109*   Electrolytes  Recent Labs Lab 06/30/15 2049 07/01/15 0711 07/02/15 0241  CALCIUM 8.9 8.9 9.0  MG 1.9  --  2.0  PHOS 3.0  --  3.0   Sepsis Markers  Recent Labs Lab 06/30/15 2049  LATICACIDVEN 0.8  PROCALCITON <0.10   ABG  Recent Labs Lab 06/30/15 2037 07/01/15 0111 07/01/15 0342  PHART 7.306* 7.357 7.399  PCO2ART 84.5* 75.7* 71.6*  PO2ART 99.0 103.0* 78.0*   Liver Enzymes  Recent Labs Lab 06/30/15 2049 07/02/15 0241  AST  12*  --   ALT 15  --   ALKPHOS 70  --   BILITOT 0.3  --   ALBUMIN 2.7* 2.5*   Cardiac Enzymes No results for input(s): TROPONINI, PROBNP in the last 168 hours. Glucose  Recent Labs Lab 07/01/15 1218 07/01/15 1615 07/01/15 2005 07/01/15 2351 07/02/15 0354 07/02/15 0834  GLUCAP 130* 133* 170* 111* 120* 115*    Imaging No results found. EVENTS: 5/26 - Admit to hospital after transfer from [facility] ED  STUDIES: TTE 5/9:  EF 50-55%. Grade 1 diastolic dysfunction. RV mildly dilated. PASP 69mmHg. Port CXR 5/26:  Vascular congestion w/ increased interstitial markings. Silhouetting of left hemidiaphragm. CT HEAD W/O 5/27:  No acute pathology. Small vessel  ischemic changes.  MICROBIOLOGY: Blood Ctx 5/26 >> Urine Ctx 5/26:  Negative  MRSA PCR 5/26:  Positive  ANTIBIOTICS: Rocephin 5/27   LINES/TUBES: Foley 5/26 >> PIV x2  ASSESSMENT / PLAN:  PULMONARY A: Acute on Chronic Hypercapneic Respiratory Failure H/O COPD/Emphysema on imaging  P:   NIVPPV w/ BiPAP intermittently for WOB Duoneb q4hr Prednisone 40mg  po daily  CARDIOVASCULAR A:  Possible CHF Exacerbation H/O HTN  P:  Monitor on telemetry Vitals per unit Protocol Lipitor 10mg  QD, Cardizem 45mg  QID, Lisinopril 40mg  QD  RENAL A:   Hyperkalemia - Resolved. Hyponatremia - Resolved.  P:   Trending UOP Trending electrolytes & renal function daily.  GASTROINTESTINAL A:   LUQ pain - Resolved. H/O GERD H/O Hiatal Hernia  P:   Pepcid PO daily Clear liquid diet  HEMATOLOGIC A:   Anemia - Chronic disease. Hgb 10.6 on 5/12.  P:  Trending cell counts daily w/ CBC Transfuse for Hgb <7 Heparin [place] q8hr SCDs  INFECTIOUS A:   No acute issues. (PCT <0.1)  P:   Awaiting blood cultures Monitor for fever  ENDOCRINE A:   DKA - closed AG at this time. Hypoglycemia - Resolved. H/O DM Type 2  P:   Levemir 15u [place] daily Accu-Checks AC & HS SSI per algorithm  NEUROLOGIC A:   Acute Encephalopathy - Likely delirium - Multifactorial with hypoglycemia & hypercarbia. Back Pain H/O Anxiety/Depression H/O Delirium  P:   Monitor closely. Prozac 60mg  QD Zyprexa 10mg  QHS Hydrocodone 5/325 po prn severe pain  FAMILY UPDATES:  Niece updated at bedside 5/27 by Dr. Ashok Cordia.  TODAY'S SUMMARY: 70 year old female with underlying emphysema, chronic hypoxic respiratory failure, and chronic hypercarbic respiratory failure. Presented to outside hospital with altered mentation, acute on chronic hypercarbia, and status post fall. Patient did have hypoglycemia at outside hospital likely precipitated these events. Mental status significantly improved. Starting clear liquid  diet. Continuing BiPAP prn for work of breathing. Starting low dose hydrocodone for pain in place of outpatient oxycodone. Transferring to SDU for further monitoring. FMTS to assume care & PCCM will follow as a consultant as of 5/29.  Sonia Baller Ashok Cordia, M.D. [facility] Pulmonary & Critical Care Pager:  [phone] After 3pm or if no response, call [phone] 9:52 AM 07/02/2015

## 2015-07-02 NOTE — Progress Notes (Signed)
Pt alert, oriented, pleasant and cooperative today. Transfer orders in place . Md aware.

## 2015-07-03 ENCOUNTER — Inpatient Hospital Stay (HOSPITAL_COMMUNITY): Payer: Medicare Other

## 2015-07-03 ENCOUNTER — Encounter (HOSPITAL_COMMUNITY): Payer: Self-pay

## 2015-07-03 DIAGNOSIS — J9622 Acute and chronic respiratory failure with hypercapnia: Secondary | ICD-10-CM

## 2015-07-03 DIAGNOSIS — J441 Chronic obstructive pulmonary disease with (acute) exacerbation: Secondary | ICD-10-CM

## 2015-07-03 DIAGNOSIS — R1310 Dysphagia, unspecified: Secondary | ICD-10-CM

## 2015-07-03 DIAGNOSIS — J449 Chronic obstructive pulmonary disease, unspecified: Secondary | ICD-10-CM | POA: Diagnosis present

## 2015-07-03 DIAGNOSIS — J9621 Acute and chronic respiratory failure with hypoxia: Principal | ICD-10-CM

## 2015-07-03 LAB — CBC WITH DIFFERENTIAL/PLATELET
Basophils Absolute: 0 10*3/uL (ref 0.0–0.1)
Basophils Relative: 0 %
EOS PCT: 1 %
Eosinophils Absolute: 0.1 10*3/uL (ref 0.0–0.7)
HCT: 31.8 % — ABNORMAL LOW (ref 36.0–46.0)
Hemoglobin: 9.6 g/dL — ABNORMAL LOW (ref 12.0–15.0)
LYMPHS ABS: 1.3 10*3/uL (ref 0.7–4.0)
LYMPHS PCT: 17 %
MCH: 26.6 pg (ref 26.0–34.0)
MCHC: 30.2 g/dL (ref 30.0–36.0)
MCV: 88.1 fL (ref 78.0–100.0)
MONO ABS: 0.5 10*3/uL (ref 0.1–1.0)
MONOS PCT: 7 %
Neutro Abs: 5.8 10*3/uL (ref 1.7–7.7)
Neutrophils Relative %: 75 %
PLATELETS: 355 10*3/uL (ref 150–400)
RBC: 3.61 MIL/uL — AB (ref 3.87–5.11)
RDW: 15.1 % (ref 11.5–15.5)
WBC: 7.7 10*3/uL (ref 4.0–10.5)

## 2015-07-03 LAB — GLUCOSE, CAPILLARY
GLUCOSE-CAPILLARY: 128 mg/dL — AB (ref 65–99)
Glucose-Capillary: 176 mg/dL — ABNORMAL HIGH (ref 65–99)
Glucose-Capillary: 287 mg/dL — ABNORMAL HIGH (ref 65–99)
Glucose-Capillary: 225 mg/dL — ABNORMAL HIGH (ref 65–99)

## 2015-07-03 LAB — RENAL FUNCTION PANEL
Albumin: 2.6 g/dL — ABNORMAL LOW (ref 3.5–5.0)
Anion gap: 8 (ref 5–15)
BUN: 7 mg/dL (ref 6–20)
CHLORIDE: 92 mmol/L — AB (ref 101–111)
CO2: 36 mmol/L — ABNORMAL HIGH (ref 22–32)
Calcium: 8.8 mg/dL — ABNORMAL LOW (ref 8.9–10.3)
Creatinine, Ser: 0.64 mg/dL (ref 0.44–1.00)
GFR calc Af Amer: >60 mL/min (ref >60)
Glucose, Bld: 122 mg/dL — ABNORMAL HIGH (ref 65–99)
POTASSIUM: 4.4 mmol/L (ref 3.5–5.1)
Phosphorus: 3.7 mg/dL (ref 2.5–4.6)
Sodium: 136 mmol/L (ref 135–145)

## 2015-07-03 LAB — MAGNESIUM: Magnesium: 2 mg/dL (ref 1.7–2.4)

## 2015-07-03 MED ORDER — OXYCODONE HCL 5 MG PO TABS
5.0000 mg | ORAL_TABLET | Freq: Once | ORAL | Status: AC
  Administered 2015-07-03: 5 mg via ORAL
  Filled 2015-07-03: qty 1

## 2015-07-03 MED ORDER — LEVOFLOXACIN IN D5W 750 MG/150ML IV SOLN
750.0000 mg | INTRAVENOUS | Status: DC
  Administered 2015-07-03: 750 mg via INTRAVENOUS
  Filled 2015-07-03: qty 150

## 2015-07-03 NOTE — Evaluation (Addendum)
Clinical/Bedside Swallow Evaluation Patient Details  Name: [name] MRN: [id number] Date of Birth: 1945-09-05  Today's Date: 07/03/2015 Time: SLP Start Time (ACUTE ONLY): 1010 SLP Stop Time (ACUTE ONLY): 1039 SLP Time Calculation (min) (ACUTE ONLY): 29 min  Past Medical History:  Past Medical History  Diagnosis Date  . GERD (gastroesophageal reflux disease)   . Allergic rhinitis   . Pneumonia   . Hypertension   . Diabetes mellitus, type 2 ([place])   . 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 Duodenal ulcer   . Hiatal hernia   . Diverticulosis   . IBS (irritable bowel syndrome)   . Diverticulitis 2001  . Anxiety   . Chronic airway obstruction, not elsewhere classified   . 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 Tubulovillous adenoma of colon 1998   Past Surgical History:  Past Surgical History  Procedure Laterality Date  . Cesarean section  1971  . Abdominal hysterectomy  1980  . Colectomy  07/1999    Sigmoid-chronic diverticulitis  . Rod in left leg    . Cholecystectomy      ERCP sphincterotomy, stone extraction 2006  . Endoscopic retrograde cholangiopancreatography (ercp) with propofol  11/06/2004    RMR: Normal appearing ampulla/Mildly diffusely dilated biliary tree/ status post sphincterotomy and stone extraction as  . Esophagogastroduodenoscopy  09/1999    [place]: Gastritis, mild duodenitis  . Colonoscopy  05/1999    [place]: Multiple colonic polys (benign polypoid mucosa), very tortuous sigmoid colon  . Colonoscopy  01/13/2012    Procedure: COLONOSCOPY;  Surgeon: Daneil Dolin, MD;  Location: [facility] ENDO SUITE;  Service: Endoscopy;  Laterality: N/A;  10:30  . Cataract extraction w/phaco Left 08/02/2013    Procedure: CATARACT EXTRACTION PHACO AND INTRAOCULAR LENS PLACEMENT (IOC);  Surgeon: Williams Che, MD;  Location: [facility] ORS;  Service: Ophthalmology;  Laterality: Left;  CDE 2.57  .  Cataract extraction w/phaco Right 12/20/2013    Procedure: CATARACT EXTRACTION PHACO AND INTRAOCULAR LENS PLACEMENT; CDE: 2.56;  Surgeon: Williams Che, MD;  Location: [facility] ORS;  Service: Ophthalmology;  Laterality: Right;   HPI:  70 year old readmitted 06/30/15 with acute respiratory failure and encephalopathy. This is her 4th hospitalization in the past 6 months for respiratory issues. PMH significant for COPD, DM, HLD, HTN, PNA, GERD, long history of dysphagia with aspiration risk. CT reveals no acute pathology. CXR reveals vascular congestion. Repeat CXR pending   Assessment / Plan / Recommendation Clinical Impression  BSE completed. Pt recently underwent MBS 06/13/15, which revealed aspiration risk on thin liquids. Recommendations were made for Dys 3 and nectar thick liquids. Pt reports she is "going", and "doesn't have much longer on this earth", and desires to eat and drink what she wants. Pt said "quality of life is more important than quantity". SLP reviewed MBS results, recommendations, and rationale for modified diet, and discussed pt's multiple hospitalizations (4x in 6 months) due to respiratory difficulty, and possible connection with noncompliance with diet recommendations. Pt verbalized understanding of risks of aspiration, but would like to have thin liquids, not thickened liquids.  SLP encouraged pt to continue soft foods with chopped meats for energy conservation, to which pt was agreeable. ST will begin Dys 3 diet with chopped meats, and thin liquids per pt request, in keeping with pt wishes and understanding risks of aspiration. ST to follow for education. RN aware.   Recommend  consideration of Palliative Care consult to facilitate goals of care.   Aspiration Risk  Mild aspiration risk    Diet Recommendation Thin liquid;Dysphagia 3 (Mech soft)   Liquid Administration via: Cup;Straw Medication Administration: Whole meds with puree Supervision: Patient able to self  feed Compensations: Slow rate;Small sips/bites;Follow solids with liquid;Minimize environmental distractions Postural Changes: Seated upright at 90 degrees;Remain upright for at least 30 minutes after po intake    Other  Recommendations Oral Care Recommendations: Oral care BID   Follow up Recommendations   (TBD)    Frequency and Duration min 2x/week  2 weeks       Prognosis Prognosis for Safe Diet Advancement: Fair Barriers to Reach Goals:  (respiratory status, noncompliance with recommendation)      Swallow Study   General Date of Onset: 06/30/15 HPI: 70 year old readmitted 06/30/15 with acute respiratory failure and encephalopathy. This is her 4th hospitalization in the past 6 months for respiratory issues. PMH significant for COPD, DM, HLD, HTN, PNA, GERD, long history of dysphagia with aspiration risk. CT reveals no acute pathology. CXR reveals vascular congestion. Repeat CXR pending Type of Study: Bedside Swallow Evaluation Previous Swallow Assessment: MBS completed 06/13/15 Diet Prior to this Study:  (clear liquid) Temperature Spikes Noted: No Respiratory Status: Nasal cannula History of Recent Intubation: No Behavior/Cognition: Alert;Cooperative;Pleasant mood Oral Cavity Assessment: Within Functional Limits Oral Care Completed by SLP: No Oral Cavity - Dentition: Adequate natural dentition;Dentures, top Vision: Functional for self-feeding Self-Feeding Abilities: Able to feed self Patient Positioning: Upright in bed Baseline Vocal Quality: Normal Volitional Cough: Strong Volitional Swallow: Able to elicit    Oral/Motor/Sensory Function Overall Oral Motor/Sensory Function: Within functional limits   Ice Chips Ice chips: Not tested   Thin Liquid Thin Liquid: Within functional limits Presentation: Straw Other Comments: no overt s/s aspiration observed during BSE. RN reported difficulty with liquids    Nectar Thick Nectar Thick Liquid: Not tested   Honey Thick Honey Thick  Liquid: Not tested   Puree Puree: Within functional limits Presentation: Spoon Other Comments: no overt s/s aspiration observed during this assessment.    Solid   GO   Solid: Within functional limits Presentation: [name], Desiree Brown 07/03/2015,10:56 AM  Desiree B. [facility], [place] [phone]

## 2015-07-03 NOTE — Progress Notes (Signed)
Family Medicine Teaching Service Daily Progress Note Intern Pager: [phone]  Patient name: [name] Medical record number: [id number] Date of birth: Jul 16, 1945 Age: 70 y.o. Gender: female  Primary Care Provider: Glenda Chroman, MD Consultants: None  Code Status: Partial Code- No CPR   Pt Overview and Major Events to Date:  CCM 5/26- 5/28   Assessment and Plan: [name] is a 70 y.o. female presenting with shortness of breath and cough. PMH is significant for COPD, HCAP, HTN,T2DM, Anxiety, HFpEF (G1DD)  COPD exacerbation: Still requiring BiPAP overnight. Admitted with altered mental status likely due to acute respiratory failure. Previously admitted 4 x in the  last 6 months for respiratory failure due aspirational pneumonia and COPD. Readmitted to CCM for acute hypercapnic failure; ABG: ph 7.305,  PCO2 84.5  PO2 99 CXR: Vascular congestion noted. Received Ceftriaxone x 1 on 07/01/2015. Pro-calcitonin  <0.10, therefore CCM though unlikely patient with infectious findings.  - 5 L [place] and BiPAP intermittently for WOB - Duoneb q4hr - Prednisone 40mg  po daily  - start Leavquin  - Blood cultures and urine cultures NGTD x 48 hours - Palliative care consult   HFpEF: G1DD. No crackles on lung exam or no lower extremity edema. Received a single dose IV lasix of 06/30/2015. BNP 231.8 (down from last admission)  -  Continue to follow  -  Strict I and Os  - Daily weights   Anxiety/Insomnia/?Depression: Prozac 60 mg daily, Diazepam 5 mg twice a day prn , temazepam 15 mg at bedtime as needed.  -continue home Prozac and Zyprexa   DM-2: CBGs 319, 243, 122, Home Levemir 45 units and 6 units Novolog if >CBG 200s  - SSI sensitive  - ACQHS  - Levermir 15 units daily   Hypertension: normotensive here. On lisinopril 40 at home -continue home home lisinopril  Anemia of Chronic Disease: Baseline hgb 7-8. Hgb 8.8  - Continue to trend CBC   FEN/GI: Dysphagia 3 diet  PPx: Pepcid    Disposition: Step Down   Subjective:  Patient denies any issues today. She would not mind talk to palliative care about goals of care.   Objective: Temp:  [98.3 F (36.8 C)-98.9 F (37.2 C)] 98.6 F (37 C) (05/29 0419) Pulse Rate:  [78-107] 78 (05/29 0350) Resp:  [16-38] 18 (05/29 0350) BP: (118-168)/(69-98) 142/91 mmHg (05/29 0548) SpO2:  [90 %-98 %] 92 % (05/29 0350) FiO2 (%):  [30 %] 30 % (05/29 0400) Weight:  [145 lb 4.5 oz (65.9 kg)] 145 lb 4.5 oz (65.9 kg) (05/29 0500) Physical Exam: General: Lying in bed, [place] 5 L in place  Cardiovascular: RRR, no murmurs or gallops  Respiratory: 5 Liters of [place], wheezes throughout all lung fields.  Abdomen: BS+ no ttp, no distention  Extremities: No lower extremity edema   Laboratory:  Recent Labs Lab 07/01/15 0711 07/02/15 0241 07/03/15 0337  WBC 10.6* 7.6 7.7  HGB 9.7* 9.5* 9.6*  HCT 32.3* 32.0* 31.8*  PLT 351 367 355    Recent Labs Lab 06/30/15 2049 07/01/15 0711 07/02/15 0241 07/03/15 0337  NA 132* 135 138 136  K 5.4* 4.6 3.9 4.4  CL 88* 88* 90* 92*  CO2 37* 40* 42* 36*  BUN 8 9 7 7   CREATININE 0.68 0.63 0.71 0.64  CALCIUM 8.9 8.9 9.0 8.8*  PROT 6.6  --   --   --   BILITOT 0.3  --   --   --   ALKPHOS 70  --   --   --  ALT 15  --   --   --   AST 12*  --   --   --   GLUCOSE 287* 126* 109* 122*     Imaging/Diagnostic Tests: Ct Head Wo Contrast  07/01/2015  CLINICAL DATA:  Acute onset of altered mental status. Initial encounter. EXAM: CT HEAD WITHOUT CONTRAST TECHNIQUE: Contiguous axial images were obtained from the base of the skull through the vertex without intravenous contrast. COMPARISON:  None. FINDINGS: There is no evidence of acute infarction, mass lesion, or intra- or extra-axial hemorrhage on CT. Mild periventricular white matter change likely reflects small vessel ischemic microangiopathy. The posterior fossa, including the cerebellum, brainstem and fourth ventricle, is within normal limits. The third  and lateral ventricles, and basal ganglia are unremarkable in appearance. The cerebral hemispheres are symmetric in appearance, with normal gray-white differentiation. No mass effect or midline shift is seen. There is no evidence of fracture; visualized osseous structures are unremarkable in appearance. The orbits are within normal limits. There is partial opacification of the mastoid air cells bilaterally. The paranasal sinuses are well-aerated. No significant soft tissue abnormalities are seen. IMPRESSION: 1. No acute intracranial pathology seen on CT. 2. Mild small vessel ischemic microangiopathy. 3. Partial opacification of the mastoid air cells bilaterally. Electronically Signed   By: Garald Balding M.D.   On: 07/01/2015 02:11   Dg Chest Port 1 View  07/01/2015  CLINICAL DATA:  Follow-up congestive heart failure. Patient on BiPAP. Initial encounter. EXAM: PORTABLE CHEST 1 VIEW COMPARISON:  Chest radiograph performed earlier today at 12:57 p.m. FINDINGS: The lungs are well-aerated. Vascular congestion is noted. Mildly increased interstitial markings may reflect mild interstitial edema or possibly pneumonia, slightly more prominent than on the prior study. No pleural effusion or pneumothorax is seen. The cardiomediastinal silhouette is borderline normal in size. No acute osseous abnormalities are seen. There is a chronic fracture through the right humeral head, with associated bony remodeling. IMPRESSION: 1. Vascular congestion noted. Mildly increased interstitial markings may reflect mild interstitial edema or possibly pneumonia, slightly more prominent than on the prior study. 2. Chronic fracture through the right humeral head, with associated bony remodeling. Electronically Signed   By: Garald Balding M.D.   On: 07/01/2015 01:55     Asiyah Cletis Media, MD 07/03/2015, 7:29 AM PGY-1, [place] Intern pager: [phone], text pages welcome

## 2015-07-03 NOTE — Progress Notes (Addendum)
Spoke w pt's husband. Pt was as nsg home but home now. He has home o2 w [facility] but they have not been ck on eq that is not working properly. He would like to change to [employer] for eq but car apoth [employer] and medicaid. Spoke w husband. He will stay w adult [facility] ped serv and i will talk w them about checking all his eq at home. Needs rw and bsc. Will place order for this.  [facility] does not do bsc and rw. Will alert [facility] for rw and bsc.

## 2015-07-03 NOTE — Progress Notes (Signed)
Pharmacy Antibiotic Note  [name] is a 70 y.o. female admitted on 06/30/2015 with shortness of breath and cough found to have COPD exacerbation.  Pharmacy has been consulted for Levaquin dosing. CrCl ~ 55 mL/min   Plan: -Start Levaquin 750 mg IV Q 24 hours  -Monitor CBC, renal fx  Height: 5' (152.4 cm) Weight: 145 lb 4.5 oz (65.9 kg) IBW/kg (Calculated) : 45.5  Temp (24hrs), Avg:98.5 F (36.9 C), Min:98 F (36.7 C), Max:98.9 F (37.2 C)   Recent Labs Lab 06/30/15 2049 07/01/15 0711 07/02/15 0241 07/03/15 0337  WBC 11.6* 10.6* 7.6 7.7  CREATININE 0.68 0.63 0.71 0.64  LATICACIDVEN 0.8  --   --   --     Estimated Creatinine Clearance: 55.5 mL/min (by C-G formula based on Cr of 0.64).    Allergies  Allergen Reactions  . Aspirin Other (See Comments)    History of gastric ulcers  . Nsaids     Hx of gastric ulcers (tolerates ibuprofen)    Antimicrobials this admission: 5/29 Levaquin>>   Dose adjustments this admission: None   Microbiology results: 5/26 BCx2>>  5/26 UCx>>   Thank you for allowing pharmacy to be a part of this patient's care.  Albertina Parr, PharmD., BCPS Clinical Pharmacist Pager [phone]

## 2015-07-03 NOTE — Progress Notes (Signed)
Pt alert and oriented, pleasant today as well. Anxious as prior but cooperative. Expressed the desire to have "quality vs quantity life and wants to speak to MD about it". Report was given to upcoming nurse. Waiting on transport

## 2015-07-03 NOTE — Progress Notes (Signed)
Pt transported to [facility] with belongings. Husband called and updated.

## 2015-07-03 NOTE — Progress Notes (Signed)
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  This is her 4th hospitalization in the last 26months for respiratory issues. She states her symptoms started am of admit, she was wheezing and tried her nebulizer without improvement. Her husband brought her to the ED.  Per reports she has a history of dysphagia with microaspiration.  OSH Labs: ABG: 7.25/103/106, Scr 0.53, Bicarb 38, WBC 16, H/H 11/38, platelets 428, Trop Negative, pBNP = 1287.  Admitted for evaluation to ICU.  Pt continued to use intermittent BiPAP for increased work of breathing.  Treated for DKA.  Pt transitioned to SDU on 5/28 and to FPTS.    SUBJECTIVE: No nausea or vomiting. Patient reporting some mild dyspnea at this time with intermittent wheezing and cough.  REVIEW OF SYSTEMS:  No fever, chills, or sweats. No chest pain or pressure.  VITAL SIGNS: Temp:  [98 F (36.7 C)-98.9 F (37.2 C)] 98 F (36.7 C) (05/29 1203) Pulse Rate:  [78-97] 90 (05/29 1300) Resp:  [16-33] 25 (05/29 1300) BP: (131-165)/(60-95) 149/79 mmHg (05/29 1300) SpO2:  [90 %-99 %] 92 % (05/29 1300) FiO2 (%):  [30 %] 30 % (05/29 0700) Weight:  [145 lb 4.5 oz (65.9 kg)] 145 lb 4.5 oz (65.9 kg) (05/29 0500)   HEMODYNAMICS:     VENTILATOR SETTINGS: Vent Mode:  [-]  FiO2 (%):  [30 %] 30 %   INTAKE / OUTPUT:  Intake/Output Summary (Last 24 hours) at 07/03/15 1347 Last data filed at 07/03/15 1310  Gross per 24 hour  Intake   1640 ml  Output   3051 ml  Net  -1411 ml    PHYSICAL EXAMINATION: General:  No distress. Watching TV. Awake. Neuro:  Grossly  nonfocal moving all 4 extremities. Oriented x4.  HEENT:  Moist membranes. No scleral injection. No oral ulcers. Cardiovascular:  Sinus rhythm. Regular rate. No edema. Lungs:  Symmetrically decreased breath sounds. Mildly increased work of breathing on nasal cannula oxygen. Abdomen:  Soft. Normal bowel sounds. Nontender. Integument:  Warm & dry. No rash on exposed skin. Bandage over bridge of nose.  LABS:  CBC  Recent Labs Lab 07/01/15 0711 07/02/15 0241 07/03/15 0337  WBC 10.6* 7.6 7.7  HGB 9.7* 9.5* 9.6*  HCT 32.3* 32.0* 31.8*  PLT 351 367 355   Coag's  Recent Labs Lab 06/30/15 2049  APTT 30  INR 1.02   BMET  Recent Labs Lab 07/01/15 0711 07/02/15 0241 07/03/15 0337  NA 135 138 136  K 4.6 3.9 4.4  CL 88* 90* 92*  CO2 40* 42* 36*  BUN 9 7 7   CREATININE 0.63 0.71 0.64  GLUCOSE 126* 109* 122*   Electrolytes  Recent Labs Lab 06/30/15 2049 07/01/15 0711 07/02/15 0241 07/03/15 0337  CALCIUM 8.9 8.9 9.0 8.8*  MG 1.9  --  2.0 2.0  PHOS 3.0  --  3.0 3.7   Sepsis Markers  Recent Labs Lab 06/30/15 2049  LATICACIDVEN 0.8  PROCALCITON <0.10   ABG  Recent Labs Lab 06/30/15 2037 07/01/15 0111 07/01/15 0342  PHART 7.306* 7.357 7.399  PCO2ART  84.5* 75.7* 71.6*  PO2ART 99.0 103.0* 78.0*   Liver Enzymes  Recent Labs Lab 06/30/15 2049 07/02/15 0241 07/03/15 0337  AST 12*  --   --   ALT 15  --   --   ALKPHOS 70  --   --   BILITOT 0.3  --   --   ALBUMIN 2.7* 2.5* 2.6*   Cardiac Enzymes No results for input(s): TROPONINI, PROBNP in the last 168 hours.   Glucose  Recent Labs Lab 07/02/15 1226 07/02/15 1620 07/02/15 1945 07/02/15 2157 07/03/15 0837 07/03/15 1202  GLUCAP 310* 297* 319* 243* 128* 176*    Imaging Dg Chest Port 1 View  07/03/2015  CLINICAL DATA:  Shortness of Breath EXAM: PORTABLE CHEST 1 VIEW COMPARISON:  06/30/2015 FINDINGS: Cardiac shadow is stable. Patchy infiltrate is noted in the right upper lobe. Mild interstitial  changes are again seen. No acute bony abnormality is noted. IMPRESSION: Patchy Electronically Signed   By: Inez Catalina M.D.   On: 07/03/2015 11:05   EVENTS: 5/26 - Admit to hospital after transfer from [facility] ED 5/28 - Tx to SDU and FPTS, PCCM following for pulmonary needs  STUDIES: TTE 5/9:  EF 50-55%. Grade 1 diastolic dysfunction. RV mildly dilated. PASP 36mmHg. Port CXR 5/26:  Vascular congestion w/ increased interstitial markings. Silhouetting of left hemidiaphragm. CT HEAD W/O 5/27:  No acute pathology. Small vessel ischemic changes.  MICROBIOLOGY: Blood Ctx 5/26 >> Urine Ctx 5/26:  Negative  MRSA PCR 5/26:  Positive  ANTIBIOTICS: Rocephin 5/27 >>  LINES/TUBES: Foley 5/26 >> PIV x2  ASSESSMENT / PLAN:  Acute on Chronic Hypercapneic Respiratory Failure H/O COPD / Emphysema on imaging Acute Exacerbation of COPD - initially admitted with AMS in setting of hypercarbia.  PCT negative. Improving.  P:   NIVPPV w/ BiPAP intermittently for WOB Duoneb q4hr Prednisone 40mg  po daily with slow taper  Pulmonary hygiene - mobilize, IS  SDU monitoring   Else Habermann E. Ashok Cordia, M.D. [facility] Pulmonary & Critical Care Pager:  [phone] After 3pm or if no response, call [phone] 2:23 PM 07/03/2015

## 2015-07-04 DIAGNOSIS — R0602 Shortness of breath: Secondary | ICD-10-CM

## 2015-07-04 DIAGNOSIS — Z515 Encounter for palliative care: Secondary | ICD-10-CM | POA: Insufficient documentation

## 2015-07-04 DIAGNOSIS — R1310 Dysphagia, unspecified: Secondary | ICD-10-CM

## 2015-07-04 DIAGNOSIS — J449 Chronic obstructive pulmonary disease, unspecified: Secondary | ICD-10-CM

## 2015-07-04 LAB — CBC WITH DIFFERENTIAL/PLATELET
BASOS ABS: 0 10*3/uL (ref 0.0–0.1)
Basophils Relative: 0 %
Eosinophils Absolute: 0.1 10*3/uL (ref 0.0–0.7)
Eosinophils Relative: 1 %
HEMATOCRIT: 33.2 % — AB (ref 36.0–46.0)
Hemoglobin: 10.1 g/dL — ABNORMAL LOW (ref 12.0–15.0)
LYMPHS ABS: 1.6 10*3/uL (ref 0.7–4.0)
LYMPHS PCT: 17 %
MCH: 27.2 pg (ref 26.0–34.0)
MCHC: 30.4 g/dL (ref 30.0–36.0)
MCV: 89.5 fL (ref 78.0–100.0)
MONO ABS: 0.7 10*3/uL (ref 0.1–1.0)
Monocytes Relative: 7 %
NEUTROS ABS: 7.3 10*3/uL (ref 1.7–7.7)
Neutrophils Relative %: 75 %
Platelets: 364 10*3/uL (ref 150–400)
RBC: 3.71 MIL/uL — AB (ref 3.87–5.11)
RDW: 15.6 % — AB (ref 11.5–15.5)
WBC: 9.8 10*3/uL (ref 4.0–10.5)

## 2015-07-04 LAB — GLUCOSE, CAPILLARY
GLUCOSE-CAPILLARY: 260 mg/dL — AB (ref 65–99)
Glucose-Capillary: 124 mg/dL — ABNORMAL HIGH (ref 65–99)
Glucose-Capillary: 224 mg/dL — ABNORMAL HIGH (ref 65–99)
Glucose-Capillary: 197 mg/dL — ABNORMAL HIGH (ref 65–99)

## 2015-07-04 LAB — MAGNESIUM: Magnesium: 2 mg/dL (ref 1.7–2.4)

## 2015-07-04 MED ORDER — IPRATROPIUM-ALBUTEROL 0.5-2.5 (3) MG/3ML IN SOLN
3.0000 mL | Freq: Four times a day (QID) | RESPIRATORY_TRACT | Status: DC
  Administered 2015-07-04 – 2015-07-05 (×5): 3 mL via RESPIRATORY_TRACT
  Filled 2015-07-04 (×5): qty 3

## 2015-07-04 MED ORDER — LEVOFLOXACIN 750 MG PO TABS: 750.0000 mg | ORAL_TABLET | Freq: Every day | ORAL | Status: DC

## 2015-07-04 MED ORDER — IPRATROPIUM-ALBUTEROL 0.5-2.5 (3) MG/3ML IN SOLN
3.0000 mL | RESPIRATORY_TRACT | Status: DC | PRN
  Administered 2015-07-05 – 2015-07-06 (×2): 3 mL via RESPIRATORY_TRACT
  Filled 2015-07-04 (×2): qty 3

## 2015-07-04 MED ORDER — LEVOFLOXACIN 750 MG PO TABS
750.0000 mg | ORAL_TABLET | Freq: Every day | ORAL | Status: DC
  Administered 2015-07-04 – 2015-07-06 (×3): 750 mg via ORAL
  Filled 2015-07-04 (×3): qty 1

## 2015-07-04 MED ORDER — MOMETASONE FURO-FORMOTEROL FUM 200-5 MCG/ACT IN AERO
2.0000 | INHALATION_SPRAY | Freq: Two times a day (BID) | RESPIRATORY_TRACT | Status: DC
  Administered 2015-07-04 – 2015-07-05 (×3): 2 via RESPIRATORY_TRACT
  Filled 2015-07-04: qty 8.8

## 2015-07-04 NOTE — Progress Notes (Signed)
Inpatient Diabetes Program Recommendations  AACE/ADA: New Consensus Statement on Inpatient Glycemic Control (2015)  Target Ranges:  Prepandial:   less than 140 mg/dL      Peak postprandial:   less than 180 mg/dL (1-2 hours)      Critically ill patients:  140 - 180 mg/dL  Results for [name], Desiree (MRN [id number]) as of 07/04/2015 13:54  Ref. Range 07/03/2015 12:02 07/03/2015 16:43 07/03/2015 21:44 07/04/2015 07:39 07/04/2015 11:57  Glucose-Capillary Latest Ref Range: 65-99 mg/dL 176 (H) 287 (H) 225 (H) 124 (H) 260 (H)   Review of Glycemic Control  Diabetes history: DM 2 Outpatient Diabetes medications: Levemir 45 units q d + Novolog 6 units tid with meals Current orders for Inpatient glycemic control: Levemir 15 units q d + Novolog correction 0-9 tid + 0-5 hs  Inpatient Diabetes Program Recommendations:  Noted CBGs elevated post prandial. Please consider adding meal coverage 4 units tid with meals-Glucerna (hold if eats <50%).  Thank you, Nani Gasser. Kyriana Yankee, RN, MSN, CDE Inpatient Glycemic Control Team Team Pager [phone] (8am-5pm) 07/04/2015 1:57 PM

## 2015-07-04 NOTE — Progress Notes (Signed)
   07/04/15 1500  Clinical Encounter Type  Visited With Patient not available  Visit Type Spiritual support  Referral From Nurse  Consult/Referral To [place]  [facility] stopped in while patient was on phone. Patient asked to come back. Returned 30 min. Later. Family in room.  Will check back.

## 2015-07-04 NOTE — Care Management Important Message (Signed)
Important Message  Patient Details  Name: [name] MRN: [id number] Date of Birth: 1945-03-20   Medicare Important Message Given:  Yes    Zenon Mayo, RN 07/04/2015, 12:54 [place] Message  Patient Details  Name: [name] MRN: [id number] Date of Birth: Jun 21, 1945   Medicare Important Message Given:  Yes    Zenon Mayo, RN 07/04/2015, 12:54 PM

## 2015-07-04 NOTE — Progress Notes (Signed)
Speech Language Pathology Treatment: Dysphagia  Patient Details Name: [name] MRN: [id number] DOB: Jun 04, 1945 Today's Date: 07/04/2015 Time: 1010-1021 SLP Time Calculation (min) (ACUTE ONLY): 11 min  Assessment / Plan / Recommendation Clinical Impression  Pt expresses her wishes again to prioritize "quality over quantity" of life. SLP offered education about the water protocol to potentially balance this, by offering thin liquids in a way that may have the potential to reduce her risk of infection. She continues to decline use of the thickener. SLP educated pt about aspiration precautions and swallowing strategies to use to reduce but not eliminate aspiration with thin liquids. Repositioning in bed provided for more upright posture, as well as Mod cues for use of strategies including strong cough and smaller sip size. Note that palliative care consult has been placed with meeting pending. Pt does appear to be consistent in her wishes and acknowledges the risks associated with aspiration. Will continue to following along as indicated.   HPI HPI: 70 year old readmitted 06/30/15 with acute respiratory failure and encephalopathy. This is her 4th hospitalization in the past 6 months for respiratory issues. PMH significant for COPD, DM, HLD, HTN, PNA, GERD, long history of dysphagia with aspiration risk. CT reveals no acute pathology. CXR reveals vascular congestion. Repeat CXR pending      SLP Plan  Continue with current plan of care     Recommendations  Diet recommendations: Dysphagia 3 (mechanical soft);Thin liquid (per pt wishes to accept aspiration risk) Liquids provided via: Cup;Straw Medication Administration: Whole meds with puree Supervision: Patient able to self feed;Intermittent supervision to cue for compensatory strategies Compensations: Slow rate;Small sips/bites;Follow solids with liquid;Minimize environmental distractions;Hard cough after swallow Postural Changes and/or  Swallow Maneuvers: Seated upright 90 degrees;Upright 30-60 min after meal             Oral Care Recommendations: Oral care BID Follow up Recommendations:  (tba) Plan: Continue with current plan of care     GO               Germain Osgood, M.A. CCC-SLP [phone]  Germain Osgood 07/04/2015, 10:34 AM

## 2015-07-04 NOTE — Progress Notes (Signed)
Family Medicine Teaching Service Daily Progress Note Intern Pager: [phone]  Patient name: [name] Medical record number: [id number] Date of birth: 1945/05/24 Age: 70 y.o. Gender: female  Primary Care Provider: Glenda Chroman, MD Consultants: None  Code Status: Partial Code- No CPR   Pt Overview and Major Events to Date:  CCM 5/26- 5/28   Assessment and Plan: [name] is a 70 y.o. female presenting with shortness of breath and cough. PMH is significant for COPD, HCAP, HTN,T2DM, Anxiety, HFpEF (G1DD)  COPD exacerbation:  Admitted with altered mental status likely due to acute respiratory failure. Previously admitted 4 x in the  last 6 months for respiratory failure due aspirational pneumonia and COPD. Readmitted to CCM for acute hypercapnic respiratory failure; ABG: ph 7.305,  PCO2 84.5  PO2 99 CXR: Vascular congestion noted. Received Ceftriaxone x 1 on 07/01/2015. Pro-calcitonin  <0.10, therefore CCM thought unlikely patient with infectious findings. Blood cultures and urine cultures NGTD x 48 hours. Treat patient for COPD exacerbation  - 3-4 L [place] - Duoneb q4hr and Dulera  - Prednisone 40mg  po daily (day 3/5) - Levaquin 750 mg PO once daily (day 2/5)  - Palliative care consult   HFpEF: G1DD. Slight crackles anterior in lung fields this AM, no lower extremity edema. Received a single dose IV lasix of 06/30/2015. BNP 231.8 (down from last admission). Net negative - 7L  -  Continue to follow  -  Strict I and Os  -  Daily weight - 145 lbs> 193 lbs (will weight again)   Anxiety/Insomnia/?Depression: Prozac 60 mg daily, Diazepam 5 mg twice a day prn -continue home Prozac and Zyprexa   DM-2: CBGs 128, 176, 287, 225.  Home Levemir 45 units and 6 units Novolog if >CBG 200s  - SSI sensitive  - ACQHS  - Levermir 15 units daily   Hypertension: normotensive here. On lisinopril 40 at home -continue home home lisinopril  Anemia of Chronic Disease: Baseline hgb 7-8. Hgb  10.1  FEN/GI: Dysphagia 3 diet  PPx: Pepcid   Disposition: Consider Transfer out of step down   Subjective:  Patient doing well. She has no complaints this morning. She does not want to drink nectar thickened liquids according to dysphagia 3 diet.   Objective: Temp:  [97.9 F (36.6 C)-98.8 F (37.1 C)] 97.9 F (36.6 C) (05/30 0413) Pulse Rate:  [74-100] 74 (05/30 0413) Resp:  [16-35] 28 (05/30 0413) BP: (131-161)/(60-81) 131/73 mmHg (05/30 0413) SpO2:  [82 %-98 %] 93 % (05/30 0413) Weight:  [193 lb 12.6 oz (87.9 kg)] 193 lb 12.6 oz (87.9 kg) (05/30 0413) Physical Exam: General: Lying in bed, [place] 4 L in place, foley present  Cardiovascular: RRR, no murmurs or gallops  Respiratory: 4 Liters of [place], slight crackles on anterior, bibasilar wheezes posteriorly  Abdomen: BS+ no ttp, no distention  Extremities: No lower extremity edema   Laboratory:  Recent Labs Lab 07/02/15 0241 07/03/15 0337 07/04/15 0539  WBC 7.6 7.7 9.8  HGB 9.5* 9.6* 10.1*  HCT 32.0* 31.8* 33.2*  PLT 367 355 364    Recent Labs Lab 06/30/15 2049 07/01/15 0711 07/02/15 0241 07/03/15 0337  NA 132* 135 138 136  K 5.4* 4.6 3.9 4.4  CL 88* 88* 90* 92*  CO2 37* 40* 42* 36*  BUN 8 9 7 7   CREATININE 0.68 0.63 0.71 0.64  CALCIUM 8.9 8.9 9.0 8.8*  PROT 6.6  --   --   --   BILITOT 0.3  --   --   --  ALKPHOS 70  --   --   --   ALT 15  --   --   --   AST 12*  --   --   --   GLUCOSE 287* 126* 109* 122*     Imaging/Diagnostic Tests: Ct Head Wo Contrast  07/01/2015  CLINICAL DATA:  Acute onset of altered mental status. Initial encounter. EXAM: CT HEAD WITHOUT CONTRAST TECHNIQUE: Contiguous axial images were obtained from the base of the skull through the vertex without intravenous contrast. COMPARISON:  None. FINDINGS: There is no evidence of acute infarction, mass lesion, or intra- or extra-axial hemorrhage on CT. Mild periventricular white matter change likely reflects small vessel ischemic  microangiopathy. The posterior fossa, including the cerebellum, brainstem and fourth ventricle, is within normal limits. The third and lateral ventricles, and basal ganglia are unremarkable in appearance. The cerebral hemispheres are symmetric in appearance, with normal gray-white differentiation. No mass effect or midline shift is seen. There is no evidence of fracture; visualized osseous structures are unremarkable in appearance. The orbits are within normal limits. There is partial opacification of the mastoid air cells bilaterally. The paranasal sinuses are well-aerated. No significant soft tissue abnormalities are seen. IMPRESSION: 1. No acute intracranial pathology seen on CT. 2. Mild small vessel ischemic microangiopathy. 3. Partial opacification of the mastoid air cells bilaterally. Electronically Signed   By: Garald Balding M.D.   On: 07/01/2015 02:11   Dg Chest Port 1 View  07/03/2015  CLINICAL DATA:  Shortness of Breath EXAM: PORTABLE CHEST 1 VIEW COMPARISON:  06/30/2015 FINDINGS: Cardiac shadow is stable. Patchy infiltrate is noted in the right upper lobe. Mild interstitial changes are again seen. No acute bony abnormality is noted. IMPRESSION: Patchy Electronically Signed   By: Inez Catalina M.D.   On: 07/03/2015 11:05   Dg Chest Port 1 View  07/01/2015  CLINICAL DATA:  Follow-up congestive heart failure. Patient on BiPAP. Initial encounter. EXAM: PORTABLE CHEST 1 VIEW COMPARISON:  Chest radiograph performed earlier today at 12:57 p.m. FINDINGS: The lungs are well-aerated. Vascular congestion is noted. Mildly increased interstitial markings may reflect mild interstitial edema or possibly pneumonia, slightly more prominent than on the prior study. No pleural effusion or pneumothorax is seen. The cardiomediastinal silhouette is borderline normal in size. No acute osseous abnormalities are seen. There is a chronic fracture through the right humeral head, with associated bony remodeling. IMPRESSION: 1.  Vascular congestion noted. Mildly increased interstitial markings may reflect mild interstitial edema or possibly pneumonia, slightly more prominent than on the prior study. 2. Chronic fracture through the right humeral head, with associated bony remodeling. Electronically Signed   By: Garald Balding M.D.   On: 07/01/2015 01:55     Everett Ehrler Cletis Media, MD 07/04/2015, 7:18 AM PGY-1, [place] Intern pager: [phone], text pages welcome

## 2015-07-04 NOTE — Care Management Note (Addendum)
Case Management Note  Patient Details  Name: [name] MRN: [id number] Date of Birth: 08-11-1945  Subjective/Objective:       NCM spoke with patient and she states she does not want to go to snf, she would like to go home with [facility] services and ,her neice was there and she chose [facility] from the [place] count agency list.  NCM explained that she will need HHRN, PT, OT, Aide and SW.  NCM will make referral for [facility] services.  Also palliative was consulted, per palliative note, patient wants to see how spouse feels about her going home with hospice care.  NCM will discuss this with patient tomorrow after palliative meet with patient and spouse.      5/31 - NCM spoke with patient again, she states she is not sure if she want to go with [facility] services or Home with Hospice, but her husband will be speaking with Edwin Cap with palliative again today and he will decide from that conversation.  NCM confirmed from previous conversation earlier that if they choose [facility] services they would like [facility], she said yes.  NCM awaiting decision.  Patient states she will need her oxygen rate increased as well, floor RN is contacting MD to get order for this , patient is already on home oxygen at 3 liters with [facility].  Also per previous note spouse wanted rolling walker and bsc   NCM spoke with husband he states they do not want home with hospice they want hh services with [facility], NCM informed Butch Penny with [facility] for [facility] and aide.  They do not want pt.  He states they also want a bipap, and home oxygen, bsc and rolling walker with [facility], Referral made to [facility],  Also MD to sign sleep study form, that NCM has on 3s. ( they are changing home oxygen companies - do not want [facility] pediatric[facility] any more)  .  Sleep study is scheduled for 08/29/15 at 8 pm , they will send paper work out to patient.            Action/Plan:   Expected Discharge Date:                  Expected Discharge Plan:  Skilled Nursing  Facility  In-House Referral:  Clinical Social Work  Discharge planning Services     Post Acute Care Choice:  Durable Medical Equipment Choice offered to:     DME Arranged:  Bedside commode, Walker rolling DME Agency:     HH Arranged:    HH Agency:     Status of Service:  In process, will continue to follow  Medicare Important Message Given:  Yes Date Medicare IM Given:    Medicare IM give by:    Date Additional Medicare IM Given:    Additional Medicare Important Message give by:     If discussed at [place] of Stay Meetings, dates discussed:    Additional Comments:  Zenon Mayo, RN 07/04/2015, 5:06 PM

## 2015-07-04 NOTE — Consult Note (Signed)
Consultation Note Date: 07/04/2015   Patient Name: [name]  DOB: Apr 25, 1945  MRN: [id number]  Age / Sex: 70 y.o., female  PCP: Glenda Chroman, MD Referring Physician: Blane Ohara McDiarmid, MD  Reason for Consultation: Establishing goals of care  HPI/Patient Profile: 70 y.o. female  with past medical history of h/o of COPD on 3L home O2, DM, HLD, HTN, and pulmonary HTN who presented as a transport from [place] where she presented with acute encephalopathy and SOB, hypoglycemia with BG in 5s and acute on chronic hypercapneic resp failure. This is her 4th hospitalization in the last 6 months for respiratory issues. She has been in numerous facilities and hospitals over the past ~2 months and feels she is not getting any better. Also of note she has dysphagia and likely aspirating and she hates thickened liquids.   Clinical Assessment and Goals of Care: I met today with [name] along with her niece and niece's significant other (also a close neighbor of [name]'s). We had a discussion and [name] is very clear about her wishes to focus on QOL and to spend this time at home and no more facilities. She also has no desire to thicken her liquids and understands that this can cause aspiration and cause her death. She also tells me that she is a DNR.   We had a long discussion regarding her wishes to be at home and how we can make this successful. I did recommend hospice to make this as successful and to optimize QOL and support at home. She does believe this is a good idea and also tells me that she has had hospice in the past. We also discuss management of symptoms and the use of opioids to help manage SOB and aide in her comfort. She does desire BiPAP to utilize at night and sleep to help with QOL.   However, family are all concerned about her husband's reaction and if he will agree to work with  hospice to care for her. They do say they have had some close family deaths lately (his mother and daughter I believe) and these have been very difficult. Husband's main concern with hospice care for his mother was that they "took away her meds." I did explain how and when this may be appropriate although I do not know what those circumstances were. I recommend that we next discuss with her husband their goals and discuss the benefits of hospice ([name] does seem to want this) and address his concerns. [name] will speak with her husband and ask him to call me so we can meet.   Patient is her own Media planner.     SUMMARY OF RECOMMENDATIONS   - Likely to go home with hospice care if husband agrees - to be decided for certain - Will need BiPAP, oxygen, nebulizer to manage at home - Plan to hopefully meet with husband tomorrow if he is willing and able - DNR  Code Status/Advance Care Planning:  DNR  Symptom Management:   Dyspnea: Optimize meds per PCCM. Utilize oxygen, nebs. Did discuss use of low dose opioids (Oxycodone or morphine) to manage.   Chronic low back pain: Continue Vicodin prn.   Palliative Prophylaxis:   Aspiration, Bowel Regimen and Frequent Pain Assessment  Additional Recommendations (Limitations, Scope, Preferences):  Avoid Hospitalization  Psycho-social/Spiritual:   Desire for further Chaplaincy support:yes  Additional Recommendations: Caregiving  Support/Resources and Education on Hospice  Prognosis:   < 6 months d/t recurrent acute on chronic respiratory failure.   Discharge Planning: Likely home with hospice.       Primary Diagnoses: Present on Admission:  . Acute respiratory failure ([place]) . COPD, very severe ([place]) . Acute on chronic respiratory failure with hypoxia and hypercapnia (H[facility])  I have reviewed the medical record, interviewed the patient and family, and examined the patient. The following aspects are pertinent.  Past Medical  History  Diagnosis Date  . GERD (gastroesophageal reflux disease)   . Allergic rhinitis   . Pneumonia   . Hypertension   . Diabetes mellitus, type 2 ([place])   . 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 Duodenal ulcer   . Hiatal hernia   . Diverticulosis   . IBS (irritable bowel syndrome)   . Diverticulitis 2001  . Anxiety   . Chronic airway obstruction, not elsewhere classified   . 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 Tubulovillous adenoma of colon 1998   Social History   Social History  . Marital Status: Married    Spouse Name: N/A  . Number of Children: 2  . Years of Education: N/A   Occupational History  . disabled    Social History Main Topics  . Smoking status: Former Smoker -- 0.50 packs/day    Types: Cigarettes    Quit date: 07/01/2010  . Smokeless tobacco: None     Comment: Quit x 1.5 years  . Alcohol Use: No  . Drug Use: No  . Sexual Activity: Not Asked   Other Topics Concern  . None   Social History Narrative   Family History  Problem Relation Age of Onset  . Aneurysm Mother   . Lung cancer Father   . Diabetes Sister   . Diabetes Sister    Scheduled Meds: . antiseptic oral rinse  7 mL Mouth Rinse q12n4p  . atorvastatin  10 mg Oral Daily  . chlorhexidine  15 mL Mouth Rinse BID  . Chlorhexidine Gluconate Cloth  6 each Topical Q0600  . diltiazem  45 mg Oral QID  . famotidine  20 mg Oral Daily  . feeding supplement (GLUCERNA SHAKE)  237 mL Oral BID BM  . FLUoxetine  60 mg Oral Daily  . heparin  5,000 Units Subcutaneous Q8H  . insulin aspart  0-5 Units Subcutaneous QHS  . insulin aspart  0-9 Units Subcutaneous TID WC  . insulin detemir  15 Units Subcutaneous Daily  . ipratropium-albuterol  3 mL Nebulization QID  . levofloxacin  750 mg Oral Daily  . lisinopril  40 mg Oral Daily  . loratadine  10 mg Oral Daily  . mometasone-formoterol  2 puff Inhalation BID  .  mupirocin ointment  1 application Nasal BID  . OLANZapine  10 mg Oral QHS  . predniSONE  40 mg Oral Q breakfast  . senna-docusate  1 tablet Oral Daily   Continuous Infusions:  PRN Meds:.sodium chloride, acetaminophen, food  thickener, HYDROcodone-acetaminophen, ipratropium-albuterol Medications Prior to Admission:  Prior to Admission medications   Medication Sig Start Date End Date Taking? Authorizing Provider  albuterol (PROAIR HFA) 108 (90 Base) MCG/ACT inhaler Inhale 2 puffs into the lungs every 6 (six) hours as needed for wheezing or shortness of breath.   Yes Historical Provider, MD  albuterol (PROVENTIL) (2.5 MG/3ML) 0.083% nebulizer solution Take 2.5 mg by nebulization every 4 (four) hours as needed for wheezing or shortness of breath.    Yes Historical Provider, MD  atorvastatin (LIPITOR) 10 MG tablet Take 10 mg by mouth daily.   Yes Historical Provider, MD  budesonide-formoterol (SYMBICORT) 160-4.5 MCG/ACT inhaler Inhale 2 puffs into the lungs 2 (two) times daily.   Yes Historical Provider, MD  cetirizine (ZYRTEC) 10 MG tablet Take 10 mg by mouth at bedtime.    Yes Historical Provider, MD  diazepam (VALIUM) 5 MG tablet Take 5 mg by mouth 4 (four) times daily as needed for anxiety.    Yes Historical Provider, MD  ergocalciferol (VITAMIN D2) 50000 units capsule Take 50,000 Units by mouth once a week. On Tuesdays   Yes Historical Provider, MD  feeding supplement, GLUCERNA SHAKE, (GLUCERNA SHAKE) LIQD Take 237 mLs by mouth daily with breakfast.    Yes Historical Provider, MD  fentaNYL (DURAGESIC - DOSED MCG/HR) 25 MCG/HR patch Place 25 mcg onto the skin every 3 (three) days. 05/06/15  Yes Historical Provider, MD  FLUoxetine (PROZAC) 20 MG capsule Take 60 mg by mouth daily.    Yes Historical Provider, MD  hydrochlorothiazide (MICROZIDE) 12.5 MG capsule Take 12.5 mg by mouth daily as needed (feet/ ankle swelling).   Yes Historical Provider, MD  ibuprofen (ADVIL,MOTRIN) 200 MG tablet Take 800 mg  by mouth every 6 (six) hours as needed (pain).   Yes Historical Provider, MD  insulin aspart (NOVOLOG FLEXPEN) 100 UNIT/ML FlexPen Inject 6 Units into the skin 3 (three) times daily as needed for high blood sugar (CBG >200).    Yes Historical Provider, MD  Insulin Detemir (LEVEMIR FLEXTOUCH) 100 UNIT/ML Pen Inject 45 Units into the skin daily with breakfast.    Yes Historical Provider, MD  ipratropium (ATROVENT HFA) 17 MCG/ACT inhaler Inhale 2 puffs into the lungs 4 (four) times daily as needed for wheezing (shortness of breath).   Yes Historical Provider, MD  ipratropium (ATROVENT) 0.02 % nebulizer solution Take 0.5 mg by nebulization every 6 (six) hours.   Yes Historical Provider, MD  lisinopril (PRINIVIL,ZESTRIL) 40 MG tablet Take 40 mg by mouth daily.   Yes Historical Provider, MD  metFORMIN (GLUCOPHAGE) 500 MG tablet Take 500 mg by mouth 2 (two) times daily with a meal.   Yes Historical Provider, MD  OLANZapine (ZYPREXA) 10 MG tablet Take 10 mg by mouth daily.    Yes Historical Provider, MD  omeprazole (PRILOSEC) 20 MG capsule Take 20 mg by mouth daily.     Yes Historical Provider, MD  oxyCODONE-acetaminophen (PERCOCET) 7.5-325 MG tablet Take 1 tablet by mouth 3 (three) times daily as needed (pain).  05/12/15  Yes Historical Provider, MD  tiotropium (SPIRIVA) 18 MCG inhalation capsule Place 18 mcg into inhaler and inhale daily.     Yes Historical Provider, MD  diltiazem (CARDIZEM) 90 MG tablet Take 45 mg by mouth 4 (four) times daily.    Historical Provider, MD  food thickener (THICK IT) POWD Thicken liquids as described by nutrition Patient not taking: Reported on 07/01/2015 06/16/15   Asiyah Cletis Media, MD  glipiZIDE (GLUCOTROL XL)  5 MG 24 hr tablet Take 5-10 mg by mouth 2 (two) times daily. Take 2 tablets (10 mg) by mouth every morning and 1 tablet (5 mg) each evening    Historical Provider, MD  oxyCODONE (OXY IR/ROXICODONE) 5 MG immediate release tablet Take 1 tablet (5 mg total) by mouth every  4 (four) hours as needed for moderate pain or severe pain. Patient not taking: Reported on 07/01/2015 06/16/15   Asiyah Cletis Media, MD   Allergies  Allergen Reactions  . Aspirin Other (See Comments)    History of gastric ulcers  . Nsaids     Hx of gastric ulcers (tolerates ibuprofen)   Review of Systems  Respiratory: Positive for shortness of breath.   Musculoskeletal: Positive for back pain.  Neurological: Positive for weakness.    Physical Exam  Constitutional: She is oriented to person, place, and time. She appears well-developed.  HENT:  Head: Normocephalic and atraumatic.  Cardiovascular: Normal rate and regular rhythm.   Pulmonary/Chest: Accessory muscle usage present. Tachypnea noted. No respiratory distress.  Abdominal: Normal appearance.  Neurological: She is alert and oriented to person, place, and time.    Vital Signs: BP 144/70 mmHg  Pulse 88  Temp(Src) 98.2 F (36.8 C) (Oral)  Resp 28  Ht 5' (1.524 m)  Wt 87.1 kg (192 lb 0.3 oz)  BMI 37.50 kg/m2  SpO2 97% Pain Assessment: 0-10   Pain Score: 2    SpO2: SpO2: 97 % O2 Device:SpO2: 97 % O2 Flow Rate: .O2 Flow Rate (L/min): 3 L/min  IO: Intake/output summary:  Intake/Output Summary (Last 24 hours) at 07/04/15 1619 Last data filed at 07/04/15 1415  Gross per 24 hour  Intake    240 ml  Output   3050 ml  Net  -2810 ml    LBM: Last BM Date: 07/03/15 Baseline Weight: Weight: 67.5 kg (148 lb 13 oz) Most recent weight: Weight: 87.1 kg (192 lb 0.3 oz)     Palliative Assessment/Data:     Time In: 1445 Time Out: 1600 Time Total: 69mn Greater than 50%  of this time was spent counseling and coordinating care related to the above assessment and plan.  Signed by: PPershing Proud NP   Please contact Palliative Medicine Team phone at [phone] questions and concerns.  For individual provider: See AShea Evans

## 2015-07-04 NOTE — Clinical Social Work Note (Signed)
CSW met with patient. Support at bedside. CSW had been told by staff that patient was from [facility]. Patient stated that she had been there before but most recently came from [place]. Patient does not wish to return. She states that she wants to go home to live her life like she wants to. Patient's husband is at home and patient reports that he takes care of her. She is interested in home health.  RNCM notified. CSW signing off. Consult if any other social work needs arise.  Dayton Scrape, [place]

## 2015-07-04 NOTE — Progress Notes (Signed)
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  This is her 4th hospitalization in the last 16months for respiratory issues. She states her symptoms started am of admit, she was wheezing and tried her nebulizer without improvement. Her husband brought her to the ED.  Per reports she has a history of dysphagia with microaspiration.  OSH Labs: ABG: 7.25/103/106, Scr 0.53, Bicarb 38, WBC 16, H/H 11/38, platelets 428, Trop Negative, pBNP = 1287.  Admitted for evaluation to ICU.  Pt continued to use intermittent BiPAP for increased work of breathing.  Treated for DKA.  Pt transitioned to SDU on 5/28 and to FPTS.    SUBJECTIVE: She complains of persistent dyspnea "at this point I don't think that its going to get any better." Recommended thick liquids, but refusing, drinking thin. Likely aspirating.    VITAL SIGNS: Temp:  [97.9 F (36.6 C)-98.3 F (36.8 C)] 97.9 F (36.6 C) (05/30 0413) Pulse Rate:  [74-100] 88 (05/30 0739) Resp:  [19-35] 28 (05/30 0739) BP: (131-161)/(65-81) 144/70 mmHg (05/30 0739) SpO2:  [82 %-98 %] 92 % (05/30 0834) Weight:  [87.9 kg (193 lb 12.6 oz)] 87.9 kg (193 lb 12.6 oz) (05/30 0413)   HEMODYNAMICS:     VENTILATOR SETTINGS:     INTAKE / OUTPUT:  Intake/Output Summary (Last 24 hours) at 07/04/15 1103 Last data filed at 07/04/15 1016  Gross per 24 hour  Intake    630 ml  Output   3251 ml  Net  -2621 ml    PHYSICAL EXAMINATION: General:   Watching TV. Awake. No distress. Tremulous Neuro:  Grossly nonfocal moving all 4 extremities. Oriented x4.  HEENT:  Moist  membranes. No scleral injection. No oral ulcers. Cardiovascular:  Sinus rhythm. Regular rate. No edema. Lungs:  Symmetrically decreased breath sounds. Mildly increased work of breathing on nasal cannula oxygen while eating. Slight upper airway wheeze transmitted. Abdomen:  Soft. Normal bowel sounds. Nontender. Integument:  Warm & dry. No rash on exposed skin. Bandage over bridge of nose.  LABS:  CBC  Recent Labs Lab 07/02/15 0241 07/03/15 0337 07/04/15 0539  WBC 7.6 7.7 9.8  HGB 9.5* 9.6* 10.1*  HCT 32.0* 31.8* 33.2*  PLT 367 355 364   Coag's  Recent Labs Lab 06/30/15 2049  APTT 30  INR 1.02   BMET  Recent Labs Lab 07/01/15 0711 07/02/15 0241 07/03/15 0337  NA 135 138 136  K 4.6 3.9 4.4  CL 88* 90* 92*  CO2 40* 42* 36*  BUN 9 7 7   CREATININE 0.63 0.71 0.64  GLUCOSE 126* 109* 122*   Electrolytes  Recent Labs Lab 06/30/15 2049 07/01/15 0711 07/02/15 0241 07/03/15 0337 07/04/15 0539  CALCIUM 8.9 8.9 9.0 8.8*  --   MG 1.9  --  2.0 2.0 2.0  PHOS 3.0  --  3.0 3.7  --    Sepsis Markers  Recent Labs Lab 06/30/15 2049  LATICACIDVEN 0.8  PROCALCITON <0.10   ABG  Recent Labs Lab 06/30/15 2037 07/01/15 0111 07/01/15 0342  PHART 7.306* 7.357 7.399  PCO2ART 84.5* 75.7* 71.6*  PO2ART 99.0 103.0* 78.0*   Liver Enzymes  Recent Labs Lab 06/30/15 2049 07/02/15 0241 07/03/15 0337  AST 12*  --   --   ALT 15  --   --   ALKPHOS 70  --   --   BILITOT 0.3  --   --   ALBUMIN 2.7* 2.5* 2.6*   Cardiac Enzymes No results for input(s): TROPONINI, PROBNP in the last 168 hours.   Glucose  Recent Labs Lab 07/02/15 2157 07/03/15 0837 07/03/15 1202 07/03/15 1643 07/03/15 2144 07/04/15 0739  GLUCAP 243* 128* 176* 287* 225* 124*    Imaging No results found. EVENTS: 5/26 - Admit to hospital after transfer from [facility] ED 5/28 - Tx to SDU and FPTS, PCCM following for pulmonary needs  STUDIES: TTE 5/9:  EF 50-55%. Grade 1 diastolic  dysfunction. RV mildly dilated. PASP 72mmHg. Port CXR 5/26:  Vascular congestion w/ increased interstitial markings. Silhouetting of left hemidiaphragm. CT HEAD W/O 5/27:  No acute pathology. Small vessel ischemic changes.  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  PCT negative. Improving.  P:   NIVPPV w/ BiPAP intermittently for WOB (has not needed for 24 hours) Wean O2 for SPO2 goal 88-92% (3L at home) Duoneb q4hr Dulera Prednisone with slow taper (currently 40mg ) Pulmonary hygiene - mobilize, IS  Mobilize, consult PT Agree that palliative care involvement is reasonable. She has end stage lung disease and will continue to aspirate if she refuses thickened liquids as she is.   Georgann Housekeeper, AGACNP-BC [place] [facility] Pager [phone] or [phone]  07/04/2015 11:06 AM   ATTENDING NOTE / ATTESTATION NOTE :   I have discussed the case with the resident/APP  Georgann Housekeeper.   I agree with the resident/APP's  history, physical examination, assessment, and plans.  I have edited the above note and modified it according to our agreed history, physical examination, assessment and plan.   Briefly, pt admitted for AECOPD. Slow to improve. Now full DNR. Bipap HS and prn.  BP OK. HR 90. RR 30s. Just walked to commode. O2 sats 88% on 3-4L.  Wheezing at bases. Gr 1 edema.  Cont neb meds. Slow pred taper. Asp precaution. Cont Abx.   Family :  No family at bedside. Case d/w pt and she is in agreement.    Monica Becton, MD 07/04/2015, 4:01 PM [place] [facility] Pager [phone] After 3 pm or if no answer, call  [phone]

## 2015-07-04 NOTE — Progress Notes (Signed)
Upon assessing pt she states her pain has not been controlled well, rated 9/10.  Pt has chronic back pain from fusion sx.  Pt states she thought they were going to change her pain meds.  Contacted MD on call and one time order given, also will attempt to control pain alternating PRN meds already ordered.  Will continue to monitor.

## 2015-07-04 NOTE — Progress Notes (Signed)
PT Cancellation Note  Patient Details Name: [name] MRN: [id number] DOB: Nov 02, 1945   Cancelled Treatment:    Reason Eval/Treat Not Completed: Other (comment).  Attempted to see pt x2.  Palliative visiting w/ pt.  PT will continue to follow.  Collie Siad PT, DPT  Pager: [phone] Phone: [phone] 07/04/2015, 3:32 PM

## 2015-07-05 DIAGNOSIS — L8990 Pressure ulcer of unspecified site, unspecified stage: Secondary | ICD-10-CM | POA: Insufficient documentation

## 2015-07-05 DIAGNOSIS — F411 Generalized anxiety disorder: Secondary | ICD-10-CM

## 2015-07-05 LAB — CBC WITH DIFFERENTIAL/PLATELET
Basophils Absolute: 0 10*3/uL (ref 0.0–0.1)
Basophils Relative: 0 %
EOS ABS: 0.1 10*3/uL (ref 0.0–0.7)
Eosinophils Relative: 1 %
HEMATOCRIT: 33.2 % — AB (ref 36.0–46.0)
HEMOGLOBIN: 9.9 g/dL — AB (ref 12.0–15.0)
LYMPHS ABS: 1.6 10*3/uL (ref 0.7–4.0)
LYMPHS PCT: 17 %
MCH: 26.9 pg (ref 26.0–34.0)
MCHC: 29.8 g/dL — AB (ref 30.0–36.0)
MCV: 90.2 fL (ref 78.0–100.0)
MONOS PCT: 9 %
Monocytes Absolute: 0.9 10*3/uL (ref 0.1–1.0)
NEUTROS PCT: 73 %
Neutro Abs: 6.9 10*3/uL (ref 1.7–7.7)
Platelets: 356 10*3/uL (ref 150–400)
RBC: 3.68 MIL/uL — AB (ref 3.87–5.11)
RDW: 15.3 % (ref 11.5–15.5)
WBC: 9.4 10*3/uL (ref 4.0–10.5)

## 2015-07-05 LAB — RENAL FUNCTION PANEL
ANION GAP: 6 (ref 5–15)
Albumin: 2.7 g/dL — ABNORMAL LOW (ref 3.5–5.0)
BUN: 10 mg/dL (ref 6–20)
CHLORIDE: 92 mmol/L — AB (ref 101–111)
CO2: 37 mmol/L — AB (ref 22–32)
CREATININE: 0.72 mg/dL (ref 0.44–1.00)
Calcium: 8.9 mg/dL (ref 8.9–10.3)
Glucose, Bld: 138 mg/dL — ABNORMAL HIGH (ref 65–99)
POTASSIUM: 4.6 mmol/L (ref 3.5–5.1)
Phosphorus: 4.3 mg/dL (ref 2.5–4.6)
Sodium: 135 mmol/L (ref 135–145)

## 2015-07-05 LAB — GLUCOSE, CAPILLARY
GLUCOSE-CAPILLARY: 113 mg/dL — AB (ref 65–99)
Glucose-Capillary: 268 mg/dL — ABNORMAL HIGH (ref 65–99)

## 2015-07-05 LAB — CULTURE, BLOOD (ROUTINE X 2)
CULTURE: NO GROWTH
CULTURE: NO GROWTH

## 2015-07-05 LAB — MAGNESIUM: MAGNESIUM: 2 mg/dL (ref 1.7–2.4)

## 2015-07-05 MED ORDER — DIAZEPAM 5 MG PO TABS
5.0000 mg | ORAL_TABLET | Freq: Three times a day (TID) | ORAL | Status: DC | PRN
  Administered 2015-07-05 – 2015-07-06 (×3): 5 mg via ORAL
  Filled 2015-07-05 (×3): qty 1

## 2015-07-05 MED ORDER — IPRATROPIUM-ALBUTEROL 0.5-2.5 (3) MG/3ML IN SOLN
3.0000 mL | Freq: Three times a day (TID) | RESPIRATORY_TRACT | Status: DC
  Administered 2015-07-05 – 2015-07-06 (×5): 3 mL via RESPIRATORY_TRACT
  Filled 2015-07-05 (×5): qty 3

## 2015-07-05 MED ORDER — PREDNISONE 20 MG PO TABS
30.0000 mg | ORAL_TABLET | Freq: Every day | ORAL | Status: DC
  Administered 2015-07-06: 30 mg via ORAL
  Filled 2015-07-05: qty 1

## 2015-07-05 MED ORDER — OXYCODONE HCL 5 MG PO TABS: 5.0000 mg | ORAL_TABLET | ORAL | Status: DC | PRN

## 2015-07-05 MED ORDER — OXYCODONE HCL 5 MG PO TABS
5.0000 mg | ORAL_TABLET | ORAL | Status: DC | PRN
  Administered 2015-07-05 – 2015-07-06 (×4): 5 mg via ORAL
  Filled 2015-07-05 (×4): qty 1

## 2015-07-05 NOTE — Progress Notes (Signed)
07/05/15 1100  Clinical Encounter Type  Visited With Patient;Family;Patient and family together  Visit Type Follow-up;Psychological support;Spiritual support;Critical Care  Referral From Palliative care team  Spiritual Encounters  Spiritual Needs Emotional;Prayer  Stress Factors  Patient Stress Factors Health changes  Ch met with pt and sister at bedside at PCT request; pt discussed her desire for hospice at home but concerned about husband support; husband pushing pt to change status back; husband does not fully understand status or hospice care, per [facility] evaluation in discussion with pt; Ch gave living will information for patient to understand and discuss with family; good overall discussion; [place] did observe that towards end of visit pt had some breathing distress. Gwynn Burly 11:08 AM

## 2015-07-05 NOTE — Progress Notes (Signed)
Patient arrived to nuring unit and was oriented to unit and protocol. Patient has stage 1 present to sacrum. Patient appears comfortable in bed and visitor is at bedside.

## 2015-07-05 NOTE — Progress Notes (Signed)
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  This is her 4th hospitalization in the last 26month for respiratory issues. She states her symptoms started am of admit, she was wheezing and tried her nebulizer without improvement. Her husband brought her to the ED.  Per reports she has a history of dysphagia with microaspiration.  OSH Labs: ABG: 7.25/103/106, Scr 0.53, Bicarb 38, WBC 16, H/H 11/38, platelets 428, Trop Negative, pBNP = 1287.  Admitted for evaluation to ICU.  Pt continued to use intermittent BiPAP for increased work of breathing.  Treated for DKA.  Pt transitioned to SDU on 5/28 and to FPTS.    SUBJECTIVE: Met with palliative 5/30 and would like to pursue hospice. Meeting with husband today who may be a little resistant to this.   Remains SOB. Feels better with Bipap.    VITAL SIGNS: Temp:  [97.7 F (36.5 C)-98.4 F (36.9 C)] 97.7 F (36.5 C) (05/31 0743) Pulse Rate:  [70-97] 80 (05/31 0742) Resp:  [15-29] 22 (05/31 0742) BP: (132-157)/(63-76) 132/68 mmHg (05/31 0742) SpO2:  [91 %-100 %] 97 % (05/31 0742) Weight:  [87.1 kg (192 lb 0.3 oz)] 87.1 kg (192 lb 0.3 oz) (05/30 1153)   HEMODYNAMICS:     VENTILATOR SETTINGS:     INTAKE / OUTPUT:  Intake/Output Summary (Last 24 hours) at 07/05/15 1019 Last data filed at 07/05/15 0748  Gross per 24 hour  Intake    120 ml  Output   2050 ml  Net  -1930 ml    PHYSICAL EXAMINATION:  General:   Watching TV. Awake. Mild distress. Tremulous Neuro:  Grossly nonfocal moving all 4 extremities. Oriented x4.  HEENT:  Moist membranes.  No scleral injection. No oral ulcers. Cardiovascular:  Sinus rhythm. Regular rate. trace edema. Lungs:  Symmetrically decreased breath sounds. Mildly increased work of breathing on nasal cannula oxygen. Crackles bibasilar.   Abdomen:  Soft. Normal bowel sounds. Nontender. Integument:  Warm & dry. No rash on exposed skin. Bandage over bridge of nose. Trace edema  LABS:  CBC  Recent Labs Lab 07/03/15 0337 07/04/15 0539 07/05/15 0400  WBC 7.7 9.8 9.4  HGB 9.6* 10.1* 9.9*  HCT 31.8* 33.2* 33.2*  PLT 355 364 356   Coag's  Recent Labs Lab 06/30/15 2049  APTT 30  INR 1.02   BMET  Recent Labs Lab 07/02/15 0241 07/03/15 0337 07/05/15 0400  NA 138 136 135  K 3.9 4.4 4.6  CL 90* 92* 92*  CO2 42* 36* 37*  BUN _0 CREATININE 0.71 0.64 0.72  GLUCOSE 109* 122* 138*   Electrolytes  Recent Labs Lab 07/02/15 0241 07/03/15 0337 07/04/15 0539 07/05/15 0400  CALCIUM 9.0 8.8*  --  8.9  MG 2.0 2.0 2.0 2.0  PHOS 3.0 3.7  --  4.3   Sepsis Markers  Recent Labs Lab 06/30/15 2049  LATICACIDVEN 0.8  PROCALCITON <0.10   ABG  Recent Labs Lab 06/30/15 2037 07/01/15 0111 07/01/15 0342  PHART 7.306* 7.357 7.399  PCO2ART 84.5* 75.7* 71.6*  PO2ART 99.0 103.0* 78.0*  Liver Enzymes  Recent Labs Lab 06/30/15 2049 07/02/15 0241 07/03/15 0337 07/05/15 0400  AST 12*  --   --   --   ALT 15  --   --   --   ALKPHOS 70  --   --   --   BILITOT 0.3  --   --   --   ALBUMIN 2.7* 2.5* 2.6* 2.7*   Cardiac Enzymes No results for input(s): TROPONINI, PROBNP in the last 168 hours.   Glucose  Recent Labs Lab 07/03/15 2144 07/04/15 0739 07/04/15 1157 07/04/15 1744 07/04/15 2118 07/05/15 0741  GLUCAP 225* 124* 260* 224* 197* 113*    Imaging No results found. EVENTS: 5/26 - Admit to hospital after transfer from [facility] ED 5/28 - Tx to SDU and FPTS, PCCM following for pulmonary needs 530 - Meeting with pall care  STUDIES: TTE 5/9:  EF 50-55%. Grade 1  diastolic dysfunction. RV mildly dilated. PASP 44mHg. Port CXR 5/26:  Vascular congestion w/ increased interstitial markings. Silhouetting of left hemidiaphragm. CT HEAD W/O 5/27:  No acute pathology. Small vessel ischemic changes.  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  PCT negative. Improving albeit slowly.   P:   BiPAP OHS  And prn at patient's request for her comfort Pt will need BiPaP when she gets home. Mentioned this to pt. Might be easiest if pt ends up on hospice and hospice can provide o2 and BipaP. Pt needs to talk to husband re: hospice plan.  O2 @ 3L home flow rate Duoneb tid and PRN Dulera Prednisone with slow taper >  30 mg/d currently.  Pulmonary hygiene - mobilize, IS  PT  J. AShirl Harris MD 07/05/2015, 11:07 AM [place] [facility] Pager [phone] After 3 pm or if no answer, call [phone]

## 2015-07-05 NOTE — Progress Notes (Signed)
Patient to transfer to 5W04 report given to SWOT nurse, all questions answered at this time.

## 2015-07-05 NOTE — Progress Notes (Signed)
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  This is her 4th hospitalization in the last 30months for respiratory issues. She states her symptoms started am of admit, she was wheezing and tried her nebulizer without improvement. Her husband brought her to the ED.  Per reports she has a history of dysphagia with microaspiration.  OSH Labs: ABG: 7.25/103/106, Scr 0.53, Bicarb 38, WBC 16, H/H 11/38, platelets 428, Trop Negative, pBNP = 1287.  Admitted for evaluation to ICU.  Pt continued to use intermittent BiPAP for increased work of breathing.  Treated for DKA.  Pt transitioned to SDU on 5/28 and to FPTS.   May be dc home with hospice soon. Palliative bipap.  SUBJECTIVE: Refuses dysphagia 3 diet. RN noted aspiration with all liquids.   VITAL SIGNS: Temp:  [97.7 F (36.5 C)-98.4 F (36.9 C)] 97.7 F (36.5 C) (05/31 0743) Pulse Rate:  [70-97] 80 (05/31 0742) Resp:  [15-29] 22 (05/31 0742) BP: (132-157)/(63-76) 132/68 mmHg (05/31 0742) SpO2:  [91 %-100 %] 97 % (05/31 0742) Weight:  [192 lb 0.3 oz (87.1 kg)] 192 lb 0.3 oz (87.1 kg) (05/30 1153)   HEMODYNAMICS:     VENTILATOR SETTINGS:     INTAKE / OUTPUT:  Intake/Output Summary (Last 24 hours) at 07/05/15 1040 Last data filed at 07/05/15 0748  Gross per 24 hour  Intake    120 ml  Output   2050 ml  Net  -1930 ml    PHYSICAL EXAMINATION: General:   Watching TV. Awake. No distress. Tremulous, frail Neuro:  Grossly nonfocal moving all 4 extremities. Oriented x4.  HEENT:  Moist membranes. No scleral injection. . Cardiovascular:  Sinus  rhythm. Regular rate. No edema. Lungs:  Symmetrically decreased breath sounds. Mildly increased work of breathing on nasal cannula oxygen . Slight upper airway wheeze transmitted. Abdomen:  Soft. Normal bowel sounds. Nontender. Integument:  Warm & dry. No rash on exposed skin. Bandage over bridge of nose.  LABS:  CBC  Recent Labs Lab 07/03/15 0337 07/04/15 0539 07/05/15 0400  WBC 7.7 9.8 9.4  HGB 9.6* 10.1* 9.9*  HCT 31.8* 33.2* 33.2*  PLT 355 364 356   Coag's  Recent Labs Lab 06/30/15 2049  APTT 30  INR 1.02   BMET  Recent Labs Lab 07/02/15 0241 07/03/15 0337 07/05/15 0400  NA 138 136 135  K 3.9 4.4 4.6  CL 90* 92* 92*  CO2 42* 36* 37*  BUN 7 7 10   CREATININE 0.71 0.64 0.72  GLUCOSE 109* 122* 138*   Electrolytes  Recent Labs Lab 07/02/15 0241 07/03/15 0337 07/04/15 0539 07/05/15 0400  CALCIUM 9.0 8.8*  --  8.9  MG 2.0 2.0 2.0 2.0  PHOS 3.0 3.7  --  4.3   Sepsis Markers  Recent Labs Lab 06/30/15 2049  LATICACIDVEN 0.8  PROCALCITON <0.10   ABG  Recent Labs Lab 06/30/15 2037 07/01/15 0111 07/01/15 0342  PHART 7.306* 7.357 7.399  PCO2ART 84.5* 75.7* 71.6*  PO2ART 99.0 103.0* 78.0*   Liver Enzymes  Recent Labs Lab 06/30/15 2049 07/02/15 0241 07/03/15 0337 07/05/15  0400  AST 12*  --   --   --   ALT 15  --   --   --   ALKPHOS 70  --   --   --   BILITOT 0.3  --   --   --   ALBUMIN 2.7* 2.5* 2.6* 2.7*   Cardiac Enzymes No results for input(s): TROPONINI, PROBNP in the last 168 hours.   Glucose  Recent Labs Lab 07/03/15 2144 07/04/15 0739 07/04/15 1157 07/04/15 1744 07/04/15 2118 07/05/15 0741  GLUCAP 225* 124* 260* 224* 197* 113*    Imaging No results found. EVENTS: 5/26 - Admit to hospital after transfer from [facility] ED 5/28 - Tx to SDU and FPTS, PCCM following for pulmonary needs  STUDIES: TTE 5/9:  EF 50-55%. Grade 1 diastolic dysfunction. RV mildly dilated. PASP 37mmHg. Port CXR 5/26:  Vascular  congestion w/ increased interstitial markings. Silhouetting of left hemidiaphragm. CT HEAD W/O 5/27:  No acute pathology. Small vessel ischemic changes.  MICROBIOLOGY: Blood Ctx 5/26 >>NGTD>> Urine Ctx 5/26:  Negative  MRSA PCR 5/26:  Positive  ANTIBIOTICS: Rocephin 5/27 >>off 5/30 levaquin>>  LINES/TUBES: Foley 5/26 >> PIV x2  ASSESSMENT / PLAN:  Acute on Chronic Hypercapneic Respiratory Failure. 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  PCT negative. Improving. Aspiration, chronic, refused to follow dysphagia diet.   P:   NIVPPV w/ BiPAP intermittently for WOB needs at night to be able to sleep Wean O2 for SPO2 goal 88-92% (3L at home) Duoneb q4hr Dulera Prednisone with slow taper (currently 40mg ) Pulmonary hygiene - mobilize, IS  Mobilize, consult PT Agree that palliative care involvement is reasonable. She has end stage lung disease and will continue to aspirate if she refuses thickened liquids as she is.  Goal of care may soon be comfort.  Richardson Landry Minor ACNP Maryanna Shape PCCM Pager [phone] till 3 pm If no answer page [phone] 07/05/2015, 10:40 AM      ATTENDING NOTE / ATTESTATION NOTE :   I have discussed the case with the resident/APP Richardson Landry Minor.  I agree with the resident/APP's  history, physical examination, assessment, and plans.  I have edited the above note and modified it according to our agreed history, physical examination, assessment and plan.   Pls see separate note done on this pt earlier.   Family :Family updated at length today. Spoke to pt's sister and updated of plans.    Monica Becton, MD 07/05/2015, 11:10 AM [place] [facility] Pager [phone] After 3 pm or if no answer, call [phone]

## 2015-07-05 NOTE — Progress Notes (Signed)
Family Medicine Teaching Service Daily Progress Note Intern Pager: [phone]  Patient name: [name] Medical record number: [id number] Date of birth: 09-11-1945 Age: 70 y.o. Gender: female  Primary Care Provider: Glenda Chroman, MD Consultants: None  Code Status: Partial Code- No CPR   Pt Overview and Major Events to Date:  CCM 5/26- 5/28   Assessment and Plan: [name] is a 70 y.o. female presenting with shortness of breath and cough. PMH is significant for COPD, HCAP, HTN,T2DM, Anxiety, HFpEF (G1DD)  COPD exacerbation:  Currently on 4L, BiPAP at night (though not recorded) Admitted with altered mental status likely due to acute respiratory failure. Previously admitted 4 x in the  last 6 months for respiratory failure due aspirational pneumonia and COPD. Treat patient for COPD exacerbation  - 4 L [place] - Determine oxygen requirement, walk patient today  - Night time BiPAP - Duoneb QID scheduled  - Prednisone 40mg  po daily (day 4), continue very slow taper over several months per discussion with Dr. Valentina Lucks  - Levaquin 750 mg PO once daily (day 3/5)  - Palliative care consult  - Likely to go home with hospice care if husband agrees - to be decided for certain  - Will need BiPAP, oxygen, nebulizer to manage at home - Plan to hopefully meet with husband tomorrow if he is willing and able - DNR  HFpEF: G1DD. No crackles, no lower extremity edema. Received a single dose IV lasix of 06/30/2015. BNP 231.8 (down from last admission). Net negative - 9.5 L  -  Strict I and Os  -  Daily weight - 145 lbs> 193 lbs >192 lbs   Anxiety/Insomnia/?Depression: Prozac 60 mg daily, Diazepam 5 mg twice a day prn -continue home Prozac and Zyprexa - Valium 5 mg   DM-2: CBGs [id number]. AM CBG  113. Goal CBGs 200-300s , received only 8 units of sliding scale yesterday    - ACQHS  -Will stop SSI  - Levermir 15 units daily   Hypertension: normotensive here. On lisinopril 40 at home -continue  home lisinopril  Anemia of Chronic Disease: Baseline hgb 7-8. Hgb 9.9   FEN/GI: Dysphagia 3 diet, no longer using the dysphagia diet  PPx: Pepcid   Disposition: Transfer out of step down, home tomorrow, palliative to discuss hospice with husband today   Subjective:  Patient doing well this morning. Was able to get up and use the toilet yesterday without any issues.   Objective: Temp:  [97.7 F (36.5 C)-98.4 F (36.9 C)] 97.7 F (36.5 C) (05/31 0743) Pulse Rate:  [70-97] 70 (05/31 0450) Resp:  [15-29] 18 (05/31 0450) BP: (133-157)/(63-76) 133/69 mmHg (05/31 0450) SpO2:  [91 %-100 %] 96 % (05/31 0450) Weight:  [192 lb 0.3 oz (87.1 kg)] 192 lb 0.3 oz (87.1 kg) (05/30 1153) Physical Exam: General: Lying in bed, [place] 3 L in place Cardiovascular: RRR, no murmurs or gallops  Respiratory: 4 Liters of [place], right lung field wheezes  Abdomen: BS+ no ttp, no distention  Extremities: No lower extremity edema   Laboratory:  Recent Labs Lab 07/03/15 0337 07/04/15 0539 07/05/15 0400  WBC 7.7 9.8 9.4  HGB 9.6* 10.1* 9.9*  HCT 31.8* 33.2* 33.2*  PLT 355 364 356    Recent Labs Lab 06/30/15 2049  07/02/15 0241 07/03/15 0337 07/05/15 0400  NA 132*  < > 138 136 135  K 5.4*  < > 3.9 4.4 4.6  CL 88*  < > 90* 92* 92*  CO2  37*  < > 42* 36* 37*  BUN 8  < > 7 7 10   CREATININE 0.68  < > 0.71 0.64 0.72  CALCIUM 8.9  < > 9.0 8.8* 8.9  PROT 6.6  --   --   --   --   BILITOT 0.3  --   --   --   --   ALKPHOS 70  --   --   --   --   ALT 15  --   --   --   --   AST 12*  --   --   --   --   GLUCOSE 287*  < > 109* 122* 138*  < > = values in this interval not displayed.   Imaging/Diagnostic Tests: Dg Chest Port 1 View  07/03/2015  CLINICAL DATA:  Shortness of Breath EXAM: PORTABLE CHEST 1 VIEW COMPARISON:  06/30/2015 FINDINGS: Cardiac shadow is stable. Patchy infiltrate is noted in the right upper lobe. Mild interstitial changes are again seen. No acute bony abnormality is noted. IMPRESSION:  Patchy Electronically Signed   By: Inez Catalina M.D.   On: 07/03/2015 11:05     Felice Hope Cletis Media, MD 07/05/2015, 8:31 AM PGY-1, [facility] Intern pager: [phone], text pages welcome

## 2015-07-05 NOTE — Evaluation (Signed)
Physical Therapy Evaluation Patient Details Name: [name] MRN: [id number] DOB: February 05, 1945 Today's Date: 07/05/2015   History of Present Illness  pt presents with Acute Respiratory Failure.  pt with 4 admits in past 6 months for Respiratory Difficulties.  pt with hx of Dysphagia and multiple Aspirations, COPD, HTN, PNA, Anemia, DM, Tachycardia, R Humerus fx, and Anxiety.  Clinical Impression  Pt globally deconditioned and with poor respiratory reserve.  Pt agreeable to sitting EOB with increased WOB and sats maintained at 94-95% on 4L O2.  Discussed with pt and sister about needs for returning to home with family.  Pt does indicate her husband will be present, but would benefit from [facility] to A with pt's ADLs.  Pt would benefit from hospital bed, but indicates she does not have any room for it and plans on sleeping in her lift chair.  Pt has stairs to enter her home and is unsure if she would be able to tolerate this.  Would benefit from either family lifting her up the stairs in a W/C or Ambulance transport into her home.  Pt wishes to return to home with Hospice and is considering HHPT to A with home eval.  Will continue to follow while on aucte and feel pt would benefit from [facility] services and DME for home.      Follow Up Recommendations Home health PT;Supervision/Assistance - 24 hour (Pending pt's wishes for HHPT)    Equipment Recommendations  Rolling walker with 5" wheels;3in1 (PT);Wheelchair (measurements PT);Wheelchair cushion (measurements PT) Lobbyist)    Recommendations for Other Services       Precautions / Restrictions Precautions Precautions: Fall Precaution Comments: Watch O2 sats Restrictions Weight Bearing Restrictions: No      Mobility  Bed Mobility Overal bed mobility: Needs Assistance Bed Mobility: Supine to Sit;Sit to Supine     Supine to sit: Min assist;HOB elevated Sit to supine: Min assist;HOB elevated   General bed mobility comments:  A with bring trunk to from sitting/supine.  pt with increased WOB with sats remaining 94-95% on 4L O2.    Transfers                    Ambulation/Gait                Stairs            Wheelchair Mobility    Modified Rankin (Stroke Patients Only)       Balance Overall balance assessment: Needs assistance Sitting-balance support: Bilateral upper extremity supported;Feet supported Sitting balance-Leahy Scale: Fair Sitting balance - Comments: Does use UEs, but seems to do so more for energy conservation than actual balance deficits.                                     Pertinent Vitals/Pain Pain Assessment: No/denies pain    Home Living Family/patient expects to be discharged to:: Private residence Living Arrangements: Spouse/significant other Available Help at Discharge: Family;Available 24 hours/day Type of Home: House Home Access: Stairs to enter Entrance Stairs-Rails: Left Entrance Stairs-Number of Steps: 3 (pt states 3, but old notes state 5) Home Layout: Two level;Able to live on main level with bedroom/bathroom Home Equipment: Walker - 4 wheels      Prior Function Level of Independence: Needs assistance   Gait / Transfers Assistance Needed: Only walking short distances with RW at rehab PTA.  ADL's / Homemaking  Assistance Needed: A with all ADLs        Hand Dominance   Dominant Hand: Right    Extremity/Trunk Assessment   Upper Extremity Assessment: Defer to OT evaluation           Lower Extremity Assessment: Generalized weakness      Cervical / Trunk Assessment: Kyphotic  Communication   Communication: No difficulties  Cognition Arousal/Alertness: Awake/alert Behavior During Therapy: Flat affect Overall Cognitive Status: Within Functional Limits for tasks assessed                      General Comments      Exercises        Assessment/Plan    PT Assessment Patient needs continued PT services   PT Diagnosis Difficulty walking;Generalized weakness   PT Problem List Decreased strength;Decreased activity tolerance;Decreased balance;Decreased mobility;Decreased knowledge of use of DME;Cardiopulmonary status limiting activity  PT Treatment Interventions DME instruction;Gait training;Stair training;Functional mobility training;Therapeutic activities;Therapeutic exercise;Balance training;Patient/family education   PT Goals (Current goals can be found in the Care Plan section) Acute Rehab PT Goals Patient Stated Goal: go home  PT Goal Formulation: With patient Time For Goal Achievement: 07/19/15 Potential to Achieve Goals: Fair    Frequency Min 3X/week   Barriers to discharge        Co-evaluation               End of Session Equipment Utilized During Treatment: Oxygen Activity Tolerance: Patient limited by fatigue Patient left: in bed;with call bell/phone within reach;with family/visitor present Nurse Communication: Mobility status         Time: [id number] PT Time Calculation (min) (ACUTE ONLY): 19 min   Charges:   PT Evaluation $PT Eval Moderate Complexity: 1 Procedure     PT G CodesCatarina Hartshorn, [place] [id number] 07/05/2015, 2:19 PM

## 2015-07-05 NOTE — Progress Notes (Signed)
Per patient, do not place "DNR band" on at this time. NP Delora Fuel. aware. Charge RN aware.   Ave Filter, RN

## 2015-07-05 NOTE — Progress Notes (Signed)
SATURATION QUALIFICATIONS: (This note is used to comply with regulatory documentation for home oxygen)  Patient Saturations on Room Air at Rest = 92%  Patient Saturations on Room Air while Ambulating = NA  Patient Saturations on 4 Liters of oxygen while Ambulating = 86%  Please briefly explain why patient needs home oxygen:Attempted to have patient walk upon standing patients oxygen saturation dropped to 86% while on 4L of 02.

## 2015-07-05 NOTE — Progress Notes (Signed)
Daily Progress Note   Patient Name: [name]       Date: 07/05/2015 DOB: December 23, 1945  Age: 70 y.o. MRN#: [id number] Attending Physician: Blane Ohara McDiarmid, MD Primary Care Physician: Glenda Chroman, MD Admit Date: 06/30/2015  Reason for Consultation/Follow-up: Establishing goals of care  Subjective: I spoke with Mr. [name] privately per his wishes and with [name]'s permission. He has many concerns and is struggling with his wife's advanced disease and poor prognosis. He is avoidant of conversation regarding prognosis and progression of disease. His main focus is on optimizing care but mainly getting the equipment and specifically BiPAP for them at home. He is interested in preventing readmission but with hopes of her doing better at home. He has a hard time understanding the risk of aspiration and her desire for thin liquids and how much this impacts her respiratory status. He also is upset about DNR and does not agree with this, he says his wife does not understand what this means, I did try and explain to him my conversation with his wife but he wants to focus on getting equipment and discuss this later. I do tell him that I will ask his wife her wishes again after our conversation but they I will follow what she tells me her wishes are at this point - he understands. He is also very interested in getting her referred to see Dr. Luan Pulling - pulmonary specialist in [place] close to their home. Will ask primary team to assist with referral and I also encouraged him to speak with her PCP. He does agree to allow hospice if this can help them get the equipment and BiPAP they need (he is concerned that they will take away her meds).   I did speak more with [name] along with her sister, [name], at  bedside. [name] is very tearful and stressed as she understands that her husband is scared of loosing her but she also sees the value in DNR and hospice. [name] has recently lost his mother and they lost a daughter all within the past 1-2 months. They are very much struggling with all the loss and now her poor health status. [name] wishes to involve her daughter and ask her to come this weekend from [place] to support them both and help them  get on one page. She is very tearful. I reassure her that I think this will work out with time and we will continue to work together. She wishes to maintain DNR for now and is interested in utilizing low dose opioids for relief of her SOB/dyspnea.   Length of Stay: 5  Current Medications: Scheduled Meds:  . antiseptic oral rinse  7 mL Mouth Rinse q12n4p  . chlorhexidine  15 mL Mouth Rinse BID  . diltiazem  45 mg Oral QID  . famotidine  20 mg Oral Daily  . feeding supplement (GLUCERNA SHAKE)  237 mL Oral BID BM  . FLUoxetine  60 mg Oral Daily  . heparin  5,000 Units Subcutaneous Q8H  . insulin detemir  15 Units Subcutaneous Daily  . ipratropium-albuterol  3 mL Nebulization TID  . levofloxacin  750 mg Oral Daily  . lisinopril  40 mg Oral Daily  . loratadine  10 mg Oral Daily  . OLANZapine  10 mg Oral QHS  . [START ON 07/06/2015] predniSONE  30 mg Oral Q breakfast  . senna-docusate  1 tablet Oral Daily    Continuous Infusions:    PRN Meds: sodium chloride, acetaminophen, diazepam, food thickener, ipratropium-albuterol, oxyCODONE  Physical Exam  Constitutional: She is oriented to person, place, and time. She appears well-developed.  HENT:  Head: Normocephalic and atraumatic.  Cardiovascular: Tachycardia present.   Pulmonary/Chest: Accessory muscle usage present. Tachypnea noted. No respiratory distress.  Labored even at rest  Abdominal: Normal appearance.  Neurological: She is alert and oriented to person, place, and time.              Vital Signs: BP 146/61 mmHg  Pulse 86  Temp(Src) 98 F (36.7 C) (Oral)  Resp 22  Ht 5' (1.524 m)  Wt 87.1 kg (192 lb 0.3 oz)  BMI 37.50 kg/m2  SpO2 98% SpO2: SpO2: 98 % O2 Device: O2 Device: Nasal Cannula O2 Flow Rate: O2 Flow Rate (L/min): 6 L/min  Intake/output summary:  Intake/Output Summary (Last 24 hours) at 07/05/15 1657 Last data filed at 07/05/15 1117  Gross per 24 hour  Intake    120 ml  Output   1850 ml  Net  -1730 ml   LBM: Last BM Date: 07/03/15 Baseline Weight: Weight: 67.5 kg (148 lb 13 oz) Most recent weight: Weight: 87.1 kg (192 lb 0.3 oz)       Palliative Assessment/Data:    Flowsheet Rows        Most Recent Value   Intake Tab    Referral Department  Critical care   Unit at Time of Referral  ICU   Palliative Care Primary Diagnosis  Pulmonary   Date Notified  07/03/15   Palliative Care Type  New Palliative care   Reason for referral  Clarify Goals of Care   Date of Admission  06/30/15   Date first seen by Palliative Care  07/04/15   # of days Palliative referral response time  1 Day(s)   # of days IP prior to Palliative referral  3   Clinical Assessment    Psychosocial & Spiritual Assessment    Palliative Care Outcomes       Patient Active Problem List   Diagnosis Date Noted  . Palliative care encounter   . Dysphagia 07/03/2015  . COPD, very severe ([place])   . Acute respiratory failure ([place]) 06/30/2015  . Anemia due to other cause 06/10/2015  . Lobar pneumonia, unspecified organism ([place]) 06/10/2015  . HCAP (healthcare-associated  pneumonia)   . SOB (shortness of breath)   . Notalgia   . Anxiety state   . COPD with acute lower respiratory infection ([place]) 06/09/2015  . Decompensated COPD with exacerbation (chronic obstructive pulmonary disease) ([place]) 06/09/2015  . Acute exacerbation of COPD with asthma ([place])   . Acute on chronic respiratory failure with hypoxia and hypercapnia (HCC)   . 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  Tachycardia 12/02/2010  . Transaminitis 12/01/2010  . COPD with exacerbation ([place]) 11/29/2010  . Hyponatremia 11/29/2010  . Back pain, chronic 11/29/2010  . Hypertension 11/29/2010  . Oral candidiasis 11/29/2010    Palliative Care Assessment & Plan   Patient Profile: 70 y.o. female with past medical history of h/o of COPD on 3L home O2, DM, HLD, HTN, and pulmonary HTN who presented as a transport from [place] where she presented with acute encephalopathy and SOB, hypoglycemia with BG in 62s and acute on chronic hypercapneic resp failure. This is her 4th hospitalization in the last 6 months for respiratory issues. She has been in numerous facilities and hospitals over the past ~2 months and feels she is not getting any better. Also of note she has dysphagia and likely aspirating and she hates thickened liquids.    Recommendations/Plan:  Pain/dyspnea: D/C Vicodin. OxyIR 5 mg every 4 hours prn.   Anxiety: Continue Valium prn - may need to increase frequency.   Goals of Care and Additional Recommendations:  Limitations on Scope of Treatment: Avoid Hospitalization and Initiate Comfort Feeding  Code Status:    Code Status Orders        Start     Ordered   07/04/15 1619  Do not attempt resuscitation (DNR)   Continuous    Question Answer Comment  In the event of cardiac or respiratory ARREST Do not call a "code blue"   In the event of cardiac or respiratory ARREST Do not perform Intubation, CPR, defibrillation or ACLS   In the event of cardiac or respiratory ARREST Use medication by any route, position, wound care, and other measures to relive pain and suffering. May use oxygen, suction and manual treatment of airway obstruction as needed for comfort.      07/04/15 1618    Code Status History    Date Active Date Inactive Code Status Order ID Comments User Context   06/30/2015  8:50 PM 07/04/2015  4:18 PM Partial Code [id number]  Roswell Nickel, MD Inpatient   06/30/2015   7:59 PM 06/30/2015  8:50 PM Full Code [id number]  Marshell Garfinkel, MD Inpatient   06/09/2015  4:20 PM 06/16/2015  7:24 PM DNR [id number]  Aquilla Hacker, MD ED   12/06/2010 10:31 PM 12/07/2010  3:22 PM DNR [id number]  Tamera Punt, RN ED   12/06/2010  5:36 PM 12/06/2010 10:31 PM Full Code [id number]  Dot Lanes, MD ED   11/30/2010 12:39 AM 12/06/2010  2:46 PM Full Code [id number]  Donnalee Curry, RN Inpatient       Prognosis:   < 6 months  Discharge Planning:  Home with Hospice needed. Will need further discussed with her husband to agree to make this successful at home.   Thank you for allowing the Palliative Medicine Team to assist in the care of this patient.   Time In: 1445 Time Out: 1600 Total Time 37min Prolonged Time Billed  yes       Greater than 50%  of this time was spent counseling and coordinating care related  to the above assessment and plan.  Pershing Proud, NP  Please contact Palliative Medicine Team phone at [phone] for questions and concerns.

## 2015-07-05 NOTE — Discharge Summary (Signed)
[facility] Discharge Summary  Patient name: [name] Medical record number: [id number] Date of birth: [birth year] Age: 70 y.o. Gender: female Date of Admission: 06/30/2015  Date of Discharge: 07/06/15 Admitting Physician: Marshell Garfinkel, MD  Primary Care Provider: Glenda Chroman, MD Consultants: Palliative   Indication for Hospitalization: COPD exacerbation, Acute hypercarbic respiratory failure    Discharge Diagnoses/Problem List:  Patient Active Problem List   Diagnosis Date Noted  . Pressure ulcer 07/05/2015  . Palliative care encounter   . Dysphagia 07/03/2015  . COPD, very severe ([place])   . Acute respiratory failure ([place]) 06/30/2015  . Anemia due to other cause 06/10/2015  . Lobar pneumonia, unspecified organism ([place]) 06/10/2015  . HCAP (healthcare-associated pneumonia)   . SOB (shortness of breath)   . Notalgia   . Anxiety state   . COPD with acute lower respiratory infection ([place]) 06/09/2015  . Decompensated COPD with exacerbation (chronic obstructive pulmonary disease) ([place]) 06/09/2015  . Acute exacerbation of COPD with asthma ([place])   . Acute on chronic respiratory failure with hypoxia and hypercapnia (HCC)   . 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 Tachycardia 12/02/2010  . Transaminitis 12/01/2010  . COPD with exacerbation ([place]) 11/29/2010  . Hyponatremia 11/29/2010  . Back pain, chronic 11/29/2010  . Hypertension 11/29/2010  . Oral candidiasis 11/29/2010   Disposition: Home, with home hospice   Discharge Condition: Stable  Discharge Exam:  Blood pressure 134/57, pulse 77, temperature 97.7 F (36.5 C), temperature source Oral, resp. rate 18, height 5' (1.524 m), weight 137 lb 11.2 oz (62.46 kg), SpO2 93 %.  Physical Exam: General: Lying in bed, [place] 3 L in place Cardiovascular: RRR, no murmurs or gallops  Respiratory: 3 Liters of [place], mild bibasilar crackles and diminished breath sounds throughout Abdomen: BS+, no ttp, no  distention  Extremities: No lower extremity edema   Brief Hospital Course:  [name] is 70 y.o. who was admitted by [facility] as transfer from [facility] with altered mental status. Altered mental status was likely due to acute respiratory failure. She also had hypoglycemia. Previously admitted 4 x in thelast 6 months for respiratory failure due aspirational pneumonia and COPD. Readmitted to CCM for acute hypercapnic respiratory failure; ABG: ph 7.305, PCO2 84.5 PO2 99. CXR: Vascular congestion noted. Received Ceftriaxone x 1 on 07/01/2015. Pro-calcitonin <0.10, therefore CCM thought patient unlikely to have infection. Blood cultures and urine cultures NGTD x 48 hours. Patient was started on Prednisone 40 mg, received Levaquin 4/5 days, and was provided with scheduled Duonebs QID. Patient to receive night time BiPAP at discharge and go home on 3-4 Liters of oxgyen.   Due to patient's progression of COPD and microaspiration, palliative care was consulted. After discussing with family, patient decided to go home with hospice care. Palliative Care started patient on valium, along with patient's Prozac and Zyprexa for anxiety/depression. They recommended oxycodone IR instead of vicodin for pain management. Hospice to deliver bipap to patient's home on day of discharge.   Pulmonology recommended simplified regimen of budesonide 0.5 mg BID and duonebs QID and q4h prn for COPD upon discharge. Patient also to continue slow steroid taper, taking 30 mg for 5 more days after discharge then decreasing to 20 mg daily x 1 month, followed by 15 mg daily indefinitely. She had 1 more day of levaquin left upon discharge.   Statin discontinued during hospitalization given goals of care.   Patient was initially on dysphagia diet but prefers  regular diet for improved quality of life.   Hypertensive mediations simplified to diltiazem and lisinopril upon discharge.  Given hypoglycemia,  insulin regimen reduced to 15 units levemir daily. Metformin and glipizide discontinued.   Issues for Follow Up:  1. Ensure patient is getting home hospice services and Bipap is set up. 2. Blood sugar control on reduced insulin regimen. Goal CBGs 200-300. 3. Shortness of breath on budesonide, duonebs and PO steroids. 4. Ensure patient advancing along steroid taper appropriately. She will need prescription for 15 mg  5. Please place referral to pulmonologist Dr. Luan Pulling in [place] per patient request.   Significant Procedures: None   Significant Labs and Imaging:   Recent Labs Lab 07/04/15 0539 07/05/15 0400 07/06/15 0427  WBC 9.8 9.4 10.8*  HGB 10.1* 9.9* 10.4*  HCT 33.2* 33.2* 35.4*  PLT 364 356 374    Recent Labs Lab 06/30/15 2049 07/01/15 0711 07/02/15 0241 07/03/15 0337 07/04/15 0539 07/05/15 0400 07/06/15 0427  NA 132* 135 138 136  --  135  --   K 5.4* 4.6 3.9 4.4  --  4.6  --   CL 88* 88* 90* 92*  --  92*  --   CO2 37* 40* 42* 36*  --  37*  --   GLUCOSE 287* 126* 109* 122*  --  138*  --   BUN 8 9 7 7   --  10  --   CREATININE 0.68 0.63 0.71 0.64  --  0.72  --   CALCIUM 8.9 8.9 9.0 8.8*  --  8.9  --   MG 1.9  --  2.0 2.0 2.0 2.0 2.1  PHOS 3.0  --  3.0 3.7  --  4.3  --   ALKPHOS 70  --   --   --   --   --   --   AST 12*  --   --   --   --   --   --   ALT 15  --   --   --   --   --   --   ALBUMIN 2.7*  --  2.5* 2.6*  --  2.7*  --     Results/Tests Pending at Time of Discharge:None  Discharge Medications:    Medication List    STOP taking these medications        albuterol (2.5 MG/3ML) 0.083% nebulizer solution  Commonly known as:  PROVENTIL     atorvastatin 10 MG tablet  Commonly known as:  LIPITOR     budesonide-formoterol 160-4.5 MCG/ACT inhaler  Commonly known as:  SYMBICORT     fentaNYL 25 MCG/HR patch  Commonly known as:  DURAGESIC - dosed mcg/hr     glipiZIDE 5 MG 24 hr tablet  Commonly known as:  GLUCOTROL XL     hydrochlorothiazide  12.5 MG capsule  Commonly known as:  MICROZIDE     ipratropium 0.02 % nebulizer solution  Commonly known as:  ATROVENT     ipratropium 17 MCG/ACT inhaler  Commonly known as:  ATROVENT HFA     metFORMIN 500 MG tablet  Commonly known as:  GLUCOPHAGE     NOVOLOG FLEXPEN 100 UNIT/ML FlexPen  Generic drug:  insulin aspart     omeprazole 20 MG capsule  Commonly known as:  PRILOSEC     oxyCODONE-acetaminophen 7.5-325 MG tablet  Commonly known as:  PERCOCET     PROAIR HFA 108 (90 Base) MCG/ACT inhaler  Generic drug:  albuterol  tiotropium 18 MCG inhalation capsule  Commonly known as:  SPIRIVA      TAKE these medications        budesonide 0.5 MG/2ML nebulizer solution  Commonly known as:  PULMICORT  Take 2 mLs (0.5 mg total) by nebulization 2 (two) times daily.     cetirizine 10 MG tablet  Commonly known as:  ZYRTEC  Take 10 mg by mouth at bedtime.     diazepam 5 MG tablet  Commonly known as:  VALIUM  Take 1 tablet (5 mg total) by mouth every 8 (eight) hours as needed for anxiety.     diltiazem 90 MG tablet  Commonly known as:  CARDIZEM  Take 45 mg by mouth 4 (four) times daily.     ergocalciferol 50000 units capsule  Commonly known as:  VITAMIN D2  Take 50,000 Units by mouth once a week. On Tuesdays     famotidine 20 MG tablet  Commonly known as:  PEPCID  Take 1 tablet (20 mg total) by mouth daily.     feeding supplement (GLUCERNA SHAKE) Liqd  Take 237 mLs by mouth daily with breakfast.     FLUoxetine 20 MG capsule  Commonly known as:  PROZAC  Take 60 mg by mouth daily.     food thickener Powd  Commonly known as:  THICK IT  Thicken liquids as described by nutrition     ibuprofen 200 MG tablet  Commonly known as:  ADVIL,MOTRIN  Take 800 mg by mouth every 6 (six) hours as needed (pain).     insulin detemir 100 UNIT/ML injection  Commonly known as:  LEVEMIR  Inject 0.15 mLs (15 Units total) into the skin daily.     ipratropium-albuterol 0.5-2.5 (3)  MG/3ML Soln  Commonly known as:  DUONEB  Take 3 mLs by nebulization every 4 (four) hours as needed.     ipratropium-albuterol 0.5-2.5 (3) MG/3ML Soln  Commonly known as:  DUONEB  Take 3 mLs by nebulization 4 (four) times daily.     levofloxacin 750 MG tablet  Commonly known as:  LEVAQUIN  Take 1 tablet (750 mg total) by mouth daily.     lisinopril 40 MG tablet  Commonly known as:  PRINIVIL,ZESTRIL  Take 40 mg by mouth daily.     OLANZapine 10 MG tablet  Commonly known as:  ZYPREXA  Take 10 mg by mouth daily.     oxyCODONE 5 MG immediate release tablet  Commonly known as:  Oxy IR/ROXICODONE  Take 1 tablet (5 mg total) by mouth every 4 (four) hours as needed for moderate pain (dyspnea).     predniSONE 10 MG tablet  Commonly known as:  DELTASONE  Take 3 tablets (30 mg total) by mouth daily with breakfast.     senna-docusate 8.6-50 MG tablet  Commonly known as:  Senokot-S  Take 1 tablet by mouth as needed for mild constipation.        Discharge Instructions: Please refer to Patient Instructions section of EMR for full details.  Patient was counseled important signs and symptoms that should prompt return to medical care, changes in medications, dietary instructions, activity restrictions, and follow up appointments.   Follow-Up Appointments: Follow-up Information    Follow up with [facility] On 08/29/2015.   Why:  8 pm   Contact information:   [address] [place] [phone]      Follow up with [facility].   Specialty:  Hospice Services  Contact information:   [address] [place] [address] [phone]       Schedule an appointment as soon as possible for a visit with Glenda Chroman, MD.   Specialty:  Internal Medicine   Contact information:   [place] [address] [phone]       Rogue Bussing, MD 07/06/2015, 1:36 PM PGY-1, [place]

## 2015-07-06 LAB — CBC WITH DIFFERENTIAL/PLATELET
BASOS ABS: 0 10*3/uL (ref 0.0–0.1)
BASOS PCT: 0 %
Eosinophils Absolute: 0.1 10*3/uL (ref 0.0–0.7)
Eosinophils Relative: 1 %
HEMATOCRIT: 35.4 % — AB (ref 36.0–46.0)
Hemoglobin: 10.4 g/dL — ABNORMAL LOW (ref 12.0–15.0)
LYMPHS PCT: 16 %
Lymphs Abs: 1.7 10*3/uL (ref 0.7–4.0)
MCH: 26.4 pg (ref 26.0–34.0)
MCHC: 29.4 g/dL — AB (ref 30.0–36.0)
MCV: 89.8 fL (ref 78.0–100.0)
Monocytes Absolute: 1 10*3/uL (ref 0.1–1.0)
Monocytes Relative: 10 %
NEUTROS ABS: 7.9 10*3/uL — AB (ref 1.7–7.7)
NEUTROS PCT: 73 %
PLATELETS: 374 10*3/uL (ref 150–400)
RBC: 3.94 MIL/uL (ref 3.87–5.11)
RDW: 15.4 % (ref 11.5–15.5)
WBC: 10.8 10*3/uL — AB (ref 4.0–10.5)

## 2015-07-06 LAB — MAGNESIUM: Magnesium: 2.1 mg/dL (ref 1.7–2.4)

## 2015-07-06 MED ORDER — BUDESONIDE 0.5 MG/2ML IN SUSP: 0.5000 mg | Freq: Two times a day (BID) | RESPIRATORY_TRACT | Status: AC

## 2015-07-06 MED ORDER — PREDNISONE 20 MG PO TABS: 20.0000 mg | ORAL_TABLET | Freq: Every day | ORAL | Status: DC

## 2015-07-06 MED ORDER — LEVOFLOXACIN 750 MG PO TABS: 750.0000 mg | ORAL_TABLET | Freq: Every day | ORAL | Status: DC

## 2015-07-06 MED ORDER — IPRATROPIUM-ALBUTEROL 0.5-2.5 (3) MG/3ML IN SOLN: 3.0000 mL | Freq: Four times a day (QID) | RESPIRATORY_TRACT | Status: AC

## 2015-07-06 MED ORDER — INSULIN DETEMIR 100 UNIT/ML ~~LOC~~ SOLN: 15.0000 [IU] | Freq: Every day | SUBCUTANEOUS | Status: DC

## 2015-07-06 MED ORDER — PREDNISONE 10 MG PO TABS: 30.0000 mg | ORAL_TABLET | Freq: Every day | ORAL | Status: DC

## 2015-07-06 MED ORDER — DIAZEPAM 5 MG PO TABS: 5.0000 mg | ORAL_TABLET | Freq: Three times a day (TID) | ORAL | Status: AC | PRN

## 2015-07-06 MED ORDER — FAMOTIDINE 20 MG PO TABS: 20.0000 mg | ORAL_TABLET | Freq: Every day | ORAL | Status: AC

## 2015-07-06 MED ORDER — IPRATROPIUM-ALBUTEROL 0.5-2.5 (3) MG/3ML IN SOLN: 3.0000 mL | RESPIRATORY_TRACT | Status: AC | PRN

## 2015-07-06 MED ORDER — OXYCODONE HCL 5 MG PO TABS: 5.0000 mg | ORAL_TABLET | ORAL | Status: AC | PRN

## 2015-07-06 MED ORDER — LORATADINE 10 MG PO TABS
10.0000 mg | ORAL_TABLET | Freq: Once | ORAL | Status: AC
  Administered 2015-07-06: 10 mg via ORAL
  Filled 2015-07-06: qty 1

## 2015-07-06 MED ORDER — SENNOSIDES-DOCUSATE SODIUM 8.6-50 MG PO TABS: 1.0000 | ORAL_TABLET | ORAL | Status: AC | PRN

## 2015-07-06 NOTE — Progress Notes (Addendum)
   [facility] PCCM   Noted plans for pt to go home with Hospice in am. Suggest d/c on O2 to keep O2 sats > 92%.  Suggest BiPaP 12/5 at HS and prn for SOB.  Add o2 to keep o2 sats > 92%. Suggest d/c on Budesonide 0.5 mg BID and Duoneb neb QID and prn q4 for SOB.   Plan d/w pt.  PCCM to sign off for now. Call back if with questions.    Monica Becton, MD 07/06/2015, 12:17 PM [facility] Pager [phone] After 3 pm or if no answer, call [phone]

## 2015-07-06 NOTE — Progress Notes (Signed)
Family Medicine Teaching Service Daily Progress Note Intern Pager: [phone]  Patient name: [name] Medical record number: [id number] Date of birth: Aug 27, 1945 Age: 70 y.o. Gender: female  Primary Care Provider: Glenda Chroman, MD Consultants: None  Code Status: Partial Code- No CPR   Pt Overview and Major Events to Date:  CCM 5/26- 5/28  Transferred out of SDU 5/31  Assessment and Plan: [name] is a 70 y.o. female presenting with shortness of breath and cough. PMH is significant for COPD, HCAP, HTN,T2DM, Anxiety, HFpEF (G1DD)  COPD exacerbation:  Currently on 3L, BiPAP at night (though not recorded) Admitted with altered mental status likely due to acute respiratory failure. Previously admitted 4 x in the  last 6 months for respiratory failure due aspirational pneumonia and COPD. Treat patient for COPD exacerbation  - 3 L [place] - Determine oxygen requirement, walk patient today  - Order PT evaluation to assess for any additional needs prior to discharge - Night time BiPAP - Duoneb QID scheduled  - s/p prednisone 40mg  po daily x 4 days, continue very slow taper over several months per discussion with Dr. Valentina Lucks as follows: 30 mg x 1 week (day 1/7), 20 mg x 1 month, then 15 mg indefinitely - Levaquin 750 mg PO once daily (day 4/5)  - Palliative care consult  - Likely to go home with hospice care if husband agrees  - Palliative has spoke to husband today (6/1) and he seems to be hesitant but agreeable since it will help with equipment needs. Daughter to come into town tomorrow and will discuss further with father 6/2.  - Will need BiPAP, oxygen, nebulizer to manage at home - DNR  HFpEF: G1DD. No crackles, no lower extremity edema. Received a single dose IV lasix of 06/30/2015. BNP 231.8 (down from last admission). Net negative - 9.5 L  -  Strict I and Os  -  Daily weight - 137 >> 145 lbs > 193 lbs >192 lbs   Anxiety/Insomnia/?Depression:Prozac 60 mg daily, Diazepam 5 mg twice  a day prn - continue home Prozac and Zyprexa - Valium 5 mg   DM-2: CBGs [id number]. Goal CBGs 200-300s   - ACQHS  - Stopped SSI 5/31 for low requirments - Levermir 15 units daily   Hypertension: 140s/60s overnight. On lisinopril40 at home -continue home lisinopril  Anemia of Chronic Disease: Baseline hgb 7-8. Hgb 10.4  FEN/GI: Dysphagia 3 diet, no longer using the dysphagia diet  PPx: Pepcid, heparin   Disposition: Anticipate likely discharge home with hospice services tomorrow. Palliative care arranging equipment needs.   Subjective:  Patient doing well this morning. Has been able to get up to go to the bathroom. Would like to walk more today. Thinks bipap has been helping breathing.   Objective: Temp:  [97.7 F (36.5 C)-98.3 F (36.8 C)] 97.7 F (36.5 C) (06/01 0524) Pulse Rate:  [77-93] 77 (06/01 0524) Resp:  [16-22] 18 (06/01 0524) BP: (142-166)/(61-78) 143/67 mmHg (06/01 0524) SpO2:  [91 %-98 %] 93 % (06/01 0929) Weight:  [137 lb 11.2 oz (62.46 kg)] 137 lb 11.2 oz (62.46 kg) (06/01 0603) Physical Exam: General: Lying in bed, [place] 3 L in place Cardiovascular: RRR, no murmurs or gallops  Respiratory: 3 Liters of [place], mild bibasilar crackles and diminished breath sounds throughout Abdomen: BS+, no ttp, no distention  Extremities: No lower extremity edema   Laboratory:  Recent Labs Lab 07/04/15 0539 07/05/15 0400 07/06/15 0427  WBC 9.8 9.4 10.8*  HGB  10.1* 9.9* 10.4*  HCT 33.2* 33.2* 35.4*  PLT 364 356 374    Recent Labs Lab 06/30/15 2049  07/02/15 0241 07/03/15 0337 07/05/15 0400  NA 132*  < > 138 136 135  K 5.4*  < > 3.9 4.4 4.6  CL 88*  < > 90* 92* 92*  CO2 37*  < > 42* 36* 37*  BUN 8  < > 7 7 10   CREATININE 0.68  < > 0.71 0.64 0.72  CALCIUM 8.9  < > 9.0 8.8* 8.9  PROT 6.6  --   --   --   --   BILITOT 0.3  --   --   --   --   ALKPHOS 70  --   --   --   --   ALT 15  --   --   --   --   AST 12*  --   --   --   --   GLUCOSE 287*  < > 109* 122* 138*   < > = values in this interval not displayed.  Imaging/Diagnostic Tests: Dg Chest Port 1 View  07/03/2015  CLINICAL DATA:  Shortness of Breath EXAM: PORTABLE CHEST 1 VIEW COMPARISON:  06/30/2015 FINDINGS: Cardiac shadow is stable. Patchy infiltrate is noted in the right upper lobe. Mild interstitial changes are again seen. No acute bony abnormality is noted. IMPRESSION: Patchy Electronically Signed   By: Inez Catalina M.D.   On: 07/03/2015 11:05   Rogue Bussing, MD 07/06/2015, 9:41 AM PGY-1, [place] Intern pager: [phone], text pages welcome

## 2015-07-06 NOTE — Progress Notes (Signed)
Inpatient Diabetes Program Recommendations  AACE/ADA: New Consensus Statement on Inpatient Glycemic Control (2015)  Target Ranges:  Prepandial:   less than 140 mg/dL      Peak postprandial:   less than 180 mg/dL (1-2 hours)      Critically ill patients:  140 - 180 mg/dL   Lab Results  Component Value Date   GLUCAP 268* 07/05/2015   HGBA1C 6.9* 06/09/2015    Review of Glycemic Control  Diabetes history: DM 2 Outpatient Diabetes medications: Glipizide 10 mg QAM, 5 mg QPM, Novolog 6 units TID with meals, Levemir 45 units Daily, Metformin 500 mg BID Current orders for Inpatient glycemic control: Levemir 15 units Daily  Inpatient Diabetes Program Recommendations: Correction (SSI): If not discharged today. Glucose is mostly above inpatient goal of 180 mg/dl. Please consider reordering at least CBG checks and Novolog Sensitive correction TID.   Thanks,  Tama Headings RN, MSN, [facility] Inpatient Diabetes Coordinator Team Pager [phone] (8a-5p)
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
## 2015-07-06 NOTE — Discharge Instructions (Signed)
Ms. Vidrio,  Pulmonology simplified your COPD treatment plan to taking scheduled budesonide 0.5 mg twice daily and duonebs 4 times daily as well as every 4 hours as needed. You have 1 more day of the antibiotic levaquin to take. Please continue steroids, following this plan: take 30 mg daily for 5 more days (through 07/11/15) -- this will be three 10 mg tablets. Then take 20 mg daily (prescription for 20 mg tablets provided) x 1 month, then take 15 mg daily until your doctor tells you otherwise -- an additional prescription will be needed for the 15 mg tablets of prednisone. Taking pepcid while on prednisone can help prevent stomach ulcers.  Your medications for blood pressure have been simplified to diltiazem and lisinopril.  Because of your low blood sugars on admission, your insulin regimen was decreased to 15 units levemir daily. Your goal sugars should be in the 200-300 range to avoid hypoglycemia. If your sugars are higher than this, please let your primary care doctor know so that the dose can be adjusted.

## 2015-08-29 ENCOUNTER — Ambulatory Visit (HOSPITAL_BASED_OUTPATIENT_CLINIC_OR_DEPARTMENT_OTHER): Payer: Medicare Other

## 2015-10-30 ENCOUNTER — Other Ambulatory Visit (HOSPITAL_COMMUNITY)
Admission: AD | Admit: 2015-10-30 | Discharge: 2015-10-30 | Disposition: A | Discharge status: Home / Self Care | Source: Skilled Nursing Facility | Attending: Family Medicine | Admitting: Family Medicine

## 2015-10-30 DIAGNOSIS — D649 Anemia, unspecified: Secondary | ICD-10-CM | POA: Diagnosis present

## 2015-10-30 LAB — CBC WITH DIFFERENTIAL/PLATELET
BASOS PCT: 0 %
Basophils Absolute: 0 10*3/uL (ref 0.0–0.1)
EOS ABS: 0.1 10*3/uL (ref 0.0–0.7)
EOS PCT: 0 %
HCT: 34.1 % — ABNORMAL LOW (ref 36.0–46.0)
HEMOGLOBIN: 10.9 g/dL — AB (ref 12.0–15.0)
Lymphocytes Relative: 4 %
Lymphs Abs: 0.6 10*3/uL — ABNORMAL LOW (ref 0.7–4.0)
MCH: 29.1 pg (ref 26.0–34.0)
MCHC: 32 g/dL (ref 30.0–36.0)
MCV: 90.9 fL (ref 78.0–100.0)
MONOS PCT: 3 %
Monocytes Absolute: 0.5 10*3/uL (ref 0.1–1.0)
NEUTROS PCT: 93 %
Neutro Abs: 14.5 10*3/uL — ABNORMAL HIGH (ref 1.7–7.7)
PLATELETS: 275 10*3/uL (ref 150–400)
RBC: 3.75 MIL/uL — AB (ref 3.87–5.11)
RDW: 14.4 % (ref 11.5–15.5)
WBC: 15.8 10*3/uL — AB (ref 4.0–10.5)

## 2015-12-15 ENCOUNTER — Inpatient Hospital Stay (HOSPITAL_COMMUNITY)
Admission: EM | Admit: 2015-12-15 | Discharge: 2015-12-20 | DRG: 189 | Disposition: A | Discharge status: Hospice | Payer: Medicare Other | Attending: Internal Medicine | Admitting: Internal Medicine

## 2015-12-15 ENCOUNTER — Encounter (HOSPITAL_COMMUNITY): Payer: Self-pay | Admitting: *Deleted

## 2015-12-15 ENCOUNTER — Emergency Department (HOSPITAL_COMMUNITY): Payer: Medicare Other

## 2015-12-15 DIAGNOSIS — M5489 Other dorsalgia: Secondary | ICD-10-CM | POA: Diagnosis present

## 2015-12-15 DIAGNOSIS — E119 Type 2 diabetes mellitus without complications: Secondary | ICD-10-CM | POA: Diagnosis present

## 2015-12-15 DIAGNOSIS — D72828 Other elevated white blood cell count: Secondary | ICD-10-CM

## 2015-12-15 DIAGNOSIS — I248 Other forms of acute ischemic heart disease: Secondary | ICD-10-CM | POA: Diagnosis present

## 2015-12-15 DIAGNOSIS — Z794 Long term (current) use of insulin: Secondary | ICD-10-CM

## 2015-12-15 DIAGNOSIS — Z9841 Cataract extraction status, right eye: Secondary | ICD-10-CM

## 2015-12-15 DIAGNOSIS — K219 Gastro-esophageal reflux disease without esophagitis: Secondary | ICD-10-CM | POA: Diagnosis present

## 2015-12-15 DIAGNOSIS — R7989 Other specified abnormal findings of blood chemistry: Secondary | ICD-10-CM | POA: Diagnosis present

## 2015-12-15 DIAGNOSIS — D72829 Elevated white blood cell count, unspecified: Secondary | ICD-10-CM | POA: Diagnosis present

## 2015-12-15 DIAGNOSIS — Z961 Presence of intraocular lens: Secondary | ICD-10-CM | POA: Diagnosis present

## 2015-12-15 DIAGNOSIS — I10 Essential (primary) hypertension: Secondary | ICD-10-CM | POA: Diagnosis present

## 2015-12-15 DIAGNOSIS — J441 Chronic obstructive pulmonary disease with (acute) exacerbation: Secondary | ICD-10-CM | POA: Diagnosis present

## 2015-12-15 DIAGNOSIS — G8929 Other chronic pain: Secondary | ICD-10-CM | POA: Diagnosis present

## 2015-12-15 DIAGNOSIS — Z22322 Carrier or suspected carrier of Methicillin resistant Staphylococcus aureus: Secondary | ICD-10-CM

## 2015-12-15 DIAGNOSIS — Z6832 Body mass index (BMI) 32.0-32.9, adult: Secondary | ICD-10-CM

## 2015-12-15 DIAGNOSIS — M545 Low back pain: Secondary | ICD-10-CM | POA: Diagnosis not present

## 2015-12-15 DIAGNOSIS — F419 Anxiety disorder, unspecified: Secondary | ICD-10-CM | POA: Diagnosis present

## 2015-12-15 DIAGNOSIS — Z9119 Patient's noncompliance with other medical treatment and regimen: Secondary | ICD-10-CM

## 2015-12-15 DIAGNOSIS — Z87891 Personal history of nicotine dependence: Secondary | ICD-10-CM

## 2015-12-15 DIAGNOSIS — G9341 Metabolic encephalopathy: Secondary | ICD-10-CM | POA: Diagnosis present

## 2015-12-15 DIAGNOSIS — K449 Diaphragmatic hernia without obstruction or gangrene: Secondary | ICD-10-CM | POA: Diagnosis present

## 2015-12-15 DIAGNOSIS — J9622 Acute and chronic respiratory failure with hypercapnia: Secondary | ICD-10-CM | POA: Diagnosis present

## 2015-12-15 DIAGNOSIS — G9340 Encephalopathy, unspecified: Secondary | ICD-10-CM | POA: Diagnosis not present

## 2015-12-15 DIAGNOSIS — R1310 Dysphagia, unspecified: Secondary | ICD-10-CM | POA: Diagnosis present

## 2015-12-15 DIAGNOSIS — E785 Hyperlipidemia, unspecified: Secondary | ICD-10-CM | POA: Diagnosis present

## 2015-12-15 DIAGNOSIS — M549 Dorsalgia, unspecified: Secondary | ICD-10-CM

## 2015-12-15 DIAGNOSIS — I272 Pulmonary hypertension, unspecified: Secondary | ICD-10-CM | POA: Diagnosis present

## 2015-12-15 DIAGNOSIS — Z86010 Personal history of colonic polyps: Secondary | ICD-10-CM

## 2015-12-15 DIAGNOSIS — J9602 Acute respiratory failure with hypercapnia: Secondary | ICD-10-CM

## 2015-12-15 DIAGNOSIS — R0602 Shortness of breath: Secondary | ICD-10-CM | POA: Diagnosis present

## 2015-12-15 DIAGNOSIS — Z801 Family history of malignant neoplasm of trachea, bronchus and lung: Secondary | ICD-10-CM

## 2015-12-15 DIAGNOSIS — Z9981 Dependence on supplemental oxygen: Secondary | ICD-10-CM | POA: Diagnosis not present

## 2015-12-15 DIAGNOSIS — J9621 Acute and chronic respiratory failure with hypoxia: Secondary | ICD-10-CM | POA: Diagnosis present

## 2015-12-15 DIAGNOSIS — J9601 Acute respiratory failure with hypoxia: Secondary | ICD-10-CM

## 2015-12-15 DIAGNOSIS — K589 Irritable bowel syndrome without diarrhea: Secondary | ICD-10-CM | POA: Diagnosis present

## 2015-12-15 DIAGNOSIS — Z9842 Cataract extraction status, left eye: Secondary | ICD-10-CM

## 2015-12-15 DIAGNOSIS — Z66 Do not resuscitate: Secondary | ICD-10-CM | POA: Diagnosis present

## 2015-12-15 DIAGNOSIS — Z79891 Long term (current) use of opiate analgesic: Secondary | ICD-10-CM | POA: Diagnosis not present

## 2015-12-15 DIAGNOSIS — Z515 Encounter for palliative care: Secondary | ICD-10-CM | POA: Diagnosis present

## 2015-12-15 DIAGNOSIS — Z7951 Long term (current) use of inhaled steroids: Secondary | ICD-10-CM | POA: Diagnosis not present

## 2015-12-15 DIAGNOSIS — R0603 Acute respiratory distress: Secondary | ICD-10-CM

## 2015-12-15 DIAGNOSIS — R778 Other specified abnormalities of plasma proteins: Secondary | ICD-10-CM | POA: Diagnosis present

## 2015-12-15 DIAGNOSIS — Z833 Family history of diabetes mellitus: Secondary | ICD-10-CM

## 2015-12-15 DIAGNOSIS — Z886 Allergy status to analgesic agent status: Secondary | ICD-10-CM

## 2015-12-15 DIAGNOSIS — Z7952 Long term (current) use of systemic steroids: Secondary | ICD-10-CM

## 2015-12-15 DIAGNOSIS — Z79899 Other long term (current) drug therapy: Secondary | ICD-10-CM

## 2015-12-15 DIAGNOSIS — Z9049 Acquired absence of other specified parts of digestive tract: Secondary | ICD-10-CM

## 2015-12-15 DIAGNOSIS — R748 Abnormal levels of other serum enzymes: Secondary | ICD-10-CM

## 2015-12-15 LAB — CBC WITH DIFFERENTIAL/PLATELET
Basophils Absolute: 0 10*3/uL (ref 0.0–0.1)
Basophils Relative: 0 %
Eosinophils Absolute: 0.2 10*3/uL (ref 0.0–0.7)
Eosinophils Relative: 1 %
HEMATOCRIT: 36.5 % (ref 36.0–46.0)
HEMOGLOBIN: 10.7 g/dL — AB (ref 12.0–15.0)
LYMPHS ABS: 0.7 10*3/uL (ref 0.7–4.0)
Lymphocytes Relative: 4 %
MCH: 29 pg (ref 26.0–34.0)
MCHC: 29.3 g/dL — AB (ref 30.0–36.0)
MCV: 98.9 fL (ref 78.0–100.0)
MONOS PCT: 7 %
Monocytes Absolute: 1.3 10*3/uL — ABNORMAL HIGH (ref 0.1–1.0)
NEUTROS ABS: 17.7 10*3/uL — AB (ref 1.7–7.7)
NEUTROS PCT: 88 %
Platelets: 323 10*3/uL (ref 150–400)
RBC: 3.69 MIL/uL — ABNORMAL LOW (ref 3.87–5.11)
RDW: 14.2 % (ref 11.5–15.5)
WBC: 20 10*3/uL — ABNORMAL HIGH (ref 4.0–10.5)

## 2015-12-15 LAB — BLOOD GAS, ARTERIAL
Acid-Base Excess: 11 mmol/L — ABNORMAL HIGH (ref 0.0–2.0)
BICARBONATE: 31.8 mmol/L — AB (ref 20.0–28.0)
DELIVERY SYSTEMS: POSITIVE
Drawn by: 277331
Expiratory PAP: 6
FIO2: 0.4
INSPIRATORY PAP: 14
O2 Saturation: 86.9 %
PATIENT TEMPERATURE: 37
PO2 ART: 58.3 mmHg — AB (ref 83.0–108.0)
RATE: 10 resp/min
pCO2 arterial: 103 mmHg (ref 32.0–48.0)
pH, Arterial: 7.201 — ABNORMAL LOW (ref 7.350–7.450)

## 2015-12-15 LAB — COMPREHENSIVE METABOLIC PANEL
ALBUMIN: 3.6 g/dL (ref 3.5–5.0)
ALK PHOS: 54 U/L (ref 38–126)
ALT: 21 U/L (ref 14–54)
ANION GAP: 6 (ref 5–15)
AST: 14 U/L — ABNORMAL LOW (ref 15–41)
BILIRUBIN TOTAL: 0.6 mg/dL (ref 0.3–1.2)
BUN: 21 mg/dL — ABNORMAL HIGH (ref 6–20)
CALCIUM: 9.2 mg/dL (ref 8.9–10.3)
CO2: 39 mmol/L — ABNORMAL HIGH (ref 22–32)
CREATININE: 0.95 mg/dL (ref 0.44–1.00)
Chloride: 93 mmol/L — ABNORMAL LOW (ref 101–111)
GFR calc non Af Amer: 59 mL/min — ABNORMAL LOW (ref >60)
GLUCOSE: 177 mg/dL — AB (ref 65–99)
Potassium: 4.2 mmol/L (ref 3.5–5.1)
Sodium: 138 mmol/L (ref 135–145)
TOTAL PROTEIN: 6.7 g/dL (ref 6.5–8.1)

## 2015-12-15 LAB — TROPONIN I
Troponin I: 0.03 ng/mL (ref <0.03)
Troponin I: <0.03 ng/mL (ref <0.03)

## 2015-12-15 LAB — GLUCOSE, CAPILLARY: Glucose-Capillary: 221 mg/dL — ABNORMAL HIGH (ref 65–99)

## 2015-12-15 LAB — BRAIN NATRIURETIC PEPTIDE: B Natriuretic Peptide: 179 pg/mL — ABNORMAL HIGH (ref 0.0–100.0)

## 2015-12-15 LAB — MRSA PCR SCREENING: MRSA by PCR: POSITIVE — AB

## 2015-12-15 MED ORDER — CHLORHEXIDINE GLUCONATE 0.12 % MT SOLN
15.0000 mL | Freq: Two times a day (BID) | OROMUCOSAL | Status: DC
  Administered 2015-12-16 – 2015-12-20 (×10): 15 mL via OROMUCOSAL
  Filled 2015-12-15 (×9): qty 15

## 2015-12-15 MED ORDER — MUPIROCIN 2 % EX OINT
1.0000 "application " | TOPICAL_OINTMENT | Freq: Two times a day (BID) | CUTANEOUS | Status: DC
  Administered 2015-12-16 – 2015-12-20 (×9): 1 via NASAL
  Filled 2015-12-15 (×2): qty 22

## 2015-12-15 MED ORDER — DILTIAZEM HCL ER COATED BEADS 180 MG PO CP24
180.0000 mg | ORAL_CAPSULE | Freq: Every day | ORAL | Status: DC
  Administered 2015-12-16 – 2015-12-20 (×4): 180 mg via ORAL
  Filled 2015-12-15 (×5): qty 1

## 2015-12-15 MED ORDER — ATORVASTATIN CALCIUM 20 MG PO TABS
20.0000 mg | ORAL_TABLET | Freq: Every day | ORAL | Status: DC
  Administered 2015-12-16 – 2015-12-19 (×4): 20 mg via ORAL
  Filled 2015-12-15 (×5): qty 1

## 2015-12-15 MED ORDER — OXYCODONE HCL 5 MG PO TABS
5.0000 mg | ORAL_TABLET | ORAL | Status: DC | PRN
  Administered 2015-12-16 – 2015-12-20 (×12): 5 mg via ORAL
  Filled 2015-12-15 (×13): qty 1

## 2015-12-15 MED ORDER — METHYLPREDNISOLONE SODIUM SUCC 125 MG IJ SOLR
125.0000 mg | Freq: Once | INTRAMUSCULAR | Status: AC
Start: 2015-12-15 — End: 2015-12-15
  Administered 2015-12-15: 125 mg via INTRAVENOUS
  Filled 2015-12-15: qty 2

## 2015-12-15 MED ORDER — LORAZEPAM 2 MG/ML IJ SOLN
1.0000 mg | Freq: Once | INTRAMUSCULAR | Status: AC
  Administered 2015-12-15: 1 mg via INTRAVENOUS
  Filled 2015-12-15: qty 1

## 2015-12-15 MED ORDER — INSULIN ASPART 100 UNIT/ML ~~LOC~~ SOLN
0.0000 [IU] | Freq: Every day | SUBCUTANEOUS | Status: DC
  Administered 2015-12-15 – 2015-12-17 (×2): 2 [IU] via SUBCUTANEOUS
  Administered 2015-12-19: 3 [IU] via SUBCUTANEOUS

## 2015-12-15 MED ORDER — LISINOPRIL 10 MG PO TABS
20.0000 mg | ORAL_TABLET | Freq: Every day | ORAL | Status: DC
  Administered 2015-12-16 – 2015-12-20 (×4): 20 mg via ORAL
  Filled 2015-12-15 (×6): qty 2

## 2015-12-15 MED ORDER — FAMOTIDINE 20 MG PO TABS
20.0000 mg | ORAL_TABLET | Freq: Every day | ORAL | Status: DC
  Administered 2015-12-16 – 2015-12-20 (×4): 20 mg via ORAL
  Filled 2015-12-15 (×5): qty 1

## 2015-12-15 MED ORDER — SENNOSIDES-DOCUSATE SODIUM 8.6-50 MG PO TABS
1.0000 | ORAL_TABLET | Freq: Every day | ORAL | Status: DC | PRN
  Administered 2015-12-18: 1 via ORAL
  Filled 2015-12-15: qty 1

## 2015-12-15 MED ORDER — IBUPROFEN 800 MG PO TABS
800.0000 mg | ORAL_TABLET | Freq: Four times a day (QID) | ORAL | Status: DC | PRN
  Filled 2015-12-15: qty 1

## 2015-12-15 MED ORDER — GUAIFENESIN ER 600 MG PO TB12
600.0000 mg | ORAL_TABLET | Freq: Two times a day (BID) | ORAL | Status: DC
  Administered 2015-12-15 – 2015-12-20 (×7): 600 mg via ORAL
  Filled 2015-12-15 (×10): qty 1

## 2015-12-15 MED ORDER — FENTANYL 25 MCG/HR TD PT72
25.0000 ug | MEDICATED_PATCH | TRANSDERMAL | Status: DC
  Administered 2015-12-15 – 2015-12-18 (×2): 25 ug via TRANSDERMAL
  Filled 2015-12-15 (×2): qty 1

## 2015-12-15 MED ORDER — ACETAMINOPHEN 325 MG PO TABS
650.0000 mg | ORAL_TABLET | Freq: Four times a day (QID) | ORAL | Status: DC | PRN
  Administered 2015-12-17 – 2015-12-18 (×2): 650 mg via ORAL
  Filled 2015-12-15 (×2): qty 2

## 2015-12-15 MED ORDER — ALBUTEROL (5 MG/ML) CONTINUOUS INHALATION SOLN
10.0000 mg/h | INHALATION_SOLUTION | RESPIRATORY_TRACT | Status: DC
Start: 2015-12-15 — End: 2015-12-15
  Administered 2015-12-15: 10 mg/h via RESPIRATORY_TRACT
  Filled 2015-12-15: qty 20

## 2015-12-15 MED ORDER — ENOXAPARIN SODIUM 40 MG/0.4ML ~~LOC~~ SOLN
40.0000 mg | SUBCUTANEOUS | Status: DC
Start: 2015-12-15 — End: 2015-12-20
  Administered 2015-12-15 – 2015-12-19 (×5): 40 mg via SUBCUTANEOUS
  Filled 2015-12-15 (×5): qty 0.4

## 2015-12-15 MED ORDER — MOMETASONE FURO-FORMOTEROL FUM 200-5 MCG/ACT IN AERO
2.0000 | INHALATION_SPRAY | Freq: Two times a day (BID) | RESPIRATORY_TRACT | Status: DC
  Administered 2015-12-17 – 2015-12-20 (×7): 2 via RESPIRATORY_TRACT
  Filled 2015-12-15: qty 8.8

## 2015-12-15 MED ORDER — FLUOXETINE HCL 20 MG PO CAPS
60.0000 mg | ORAL_CAPSULE | Freq: Every day | ORAL | Status: DC
  Administered 2015-12-16 – 2015-12-20 (×4): 60 mg via ORAL
  Filled 2015-12-15 (×5): qty 3

## 2015-12-15 MED ORDER — OLANZAPINE 5 MG PO TABS
10.0000 mg | ORAL_TABLET | Freq: Every day | ORAL | Status: DC
  Administered 2015-12-16 – 2015-12-20 (×4): 10 mg via ORAL
  Filled 2015-12-15 (×5): qty 2

## 2015-12-15 MED ORDER — SIMVASTATIN 20 MG PO TABS: 40.0000 mg | ORAL_TABLET | Freq: Every day | ORAL | Status: DC

## 2015-12-15 MED ORDER — ALBUTEROL SULFATE (2.5 MG/3ML) 0.083% IN NEBU
2.5000 mg | INHALATION_SOLUTION | Freq: Four times a day (QID) | RESPIRATORY_TRACT | Status: DC
  Administered 2015-12-15 – 2015-12-20 (×19): 2.5 mg via RESPIRATORY_TRACT
  Filled 2015-12-15 (×20): qty 3

## 2015-12-15 MED ORDER — INSULIN ASPART 100 UNIT/ML ~~LOC~~ SOLN
0.0000 [IU] | Freq: Three times a day (TID) | SUBCUTANEOUS | Status: DC
  Administered 2015-12-16: 3 [IU] via SUBCUTANEOUS
  Administered 2015-12-16: 7 [IU] via SUBCUTANEOUS
  Administered 2015-12-16: 4 [IU] via SUBCUTANEOUS
  Administered 2015-12-17 – 2015-12-18 (×4): 7 [IU] via SUBCUTANEOUS
  Administered 2015-12-18: 4 [IU] via SUBCUTANEOUS
  Administered 2015-12-18: 11 [IU] via SUBCUTANEOUS
  Administered 2015-12-19: 7 [IU] via SUBCUTANEOUS
  Administered 2015-12-19: 20 [IU] via SUBCUTANEOUS
  Administered 2015-12-20: 11 [IU] via SUBCUTANEOUS
  Administered 2015-12-20: 15 [IU] via SUBCUTANEOUS

## 2015-12-15 MED ORDER — ONDANSETRON HCL 4 MG PO TABS: 4.0000 mg | ORAL_TABLET | Freq: Four times a day (QID) | ORAL | Status: DC | PRN

## 2015-12-15 MED ORDER — SODIUM CHLORIDE 0.9% FLUSH
3.0000 mL | Freq: Two times a day (BID) | INTRAVENOUS | Status: DC
  Administered 2015-12-15 – 2015-12-20 (×10): 3 mL via INTRAVENOUS

## 2015-12-15 MED ORDER — PANTOPRAZOLE SODIUM 40 MG PO TBEC
40.0000 mg | DELAYED_RELEASE_TABLET | Freq: Every day | ORAL | Status: DC
  Administered 2015-12-16 – 2015-12-20 (×4): 40 mg via ORAL
  Filled 2015-12-15 (×5): qty 1

## 2015-12-15 MED ORDER — IPRATROPIUM BROMIDE 0.02 % IN SOLN
0.5000 mg | Freq: Once | RESPIRATORY_TRACT | Status: AC
  Administered 2015-12-15: 0.5 mg via RESPIRATORY_TRACT
  Filled 2015-12-15: qty 2.5

## 2015-12-15 MED ORDER — METOPROLOL TARTRATE 5 MG/5ML IV SOLN
2.5000 mg | Freq: Once | INTRAVENOUS | Status: AC
  Administered 2015-12-15: 2.5 mg via INTRAVENOUS
  Filled 2015-12-15: qty 5

## 2015-12-15 MED ORDER — MORPHINE SULFATE (CONCENTRATE) 10 MG/0.5ML PO SOLN
5.0000 mg | ORAL | Status: DC | PRN
  Administered 2015-12-16 – 2015-12-18 (×4): 5 mg via SUBLINGUAL
  Filled 2015-12-15 (×5): qty 0.5

## 2015-12-15 MED ORDER — ONDANSETRON HCL 4 MG/2ML IJ SOLN
4.0000 mg | Freq: Four times a day (QID) | INTRAMUSCULAR | Status: DC | PRN
  Administered 2015-12-18: 4 mg via INTRAVENOUS
  Filled 2015-12-15: qty 2

## 2015-12-15 MED ORDER — ALBUTEROL SULFATE (2.5 MG/3ML) 0.083% IN NEBU: 2.5000 mg | INHALATION_SOLUTION | RESPIRATORY_TRACT | Status: DC | PRN

## 2015-12-15 MED ORDER — ACETAMINOPHEN 650 MG RE SUPP: 650.0000 mg | Freq: Four times a day (QID) | RECTAL | Status: DC | PRN

## 2015-12-15 MED ORDER — TIOTROPIUM BROMIDE MONOHYDRATE 18 MCG IN CAPS
18.0000 ug | ORAL_CAPSULE | Freq: Every day | RESPIRATORY_TRACT | Status: DC
  Administered 2015-12-17 – 2015-12-20 (×4): 18 ug via RESPIRATORY_TRACT
  Filled 2015-12-15: qty 5

## 2015-12-15 MED ORDER — ORAL CARE MOUTH RINSE
15.0000 mL | Freq: Two times a day (BID) | OROMUCOSAL | Status: DC
  Administered 2015-12-16 – 2015-12-20 (×9): 15 mL via OROMUCOSAL

## 2015-12-15 MED ORDER — CHLORHEXIDINE GLUCONATE CLOTH 2 % EX PADS
6.0000 | MEDICATED_PAD | Freq: Every day | CUTANEOUS | Status: DC
  Administered 2015-12-16 – 2015-12-20 (×4): 6 via TOPICAL

## 2015-12-15 MED ORDER — INSULIN DETEMIR 100 UNIT/ML ~~LOC~~ SOLN
15.0000 [IU] | Freq: Every day | SUBCUTANEOUS | Status: DC
  Administered 2015-12-16 – 2015-12-20 (×5): 15 [IU] via SUBCUTANEOUS
  Filled 2015-12-15 (×6): qty 0.15

## 2015-12-15 MED ORDER — METHYLPREDNISOLONE SODIUM SUCC 125 MG IJ SOLR
60.0000 mg | Freq: Four times a day (QID) | INTRAMUSCULAR | Status: DC
  Administered 2015-12-15 – 2015-12-20 (×20): 60 mg via INTRAVENOUS
  Filled 2015-12-15 (×20): qty 2

## 2015-12-15 MED ORDER — IPRATROPIUM-ALBUTEROL 0.5-2.5 (3) MG/3ML IN SOLN
3.0000 mL | Freq: Once | RESPIRATORY_TRACT | Status: AC
  Administered 2015-12-15: 3 mL via RESPIRATORY_TRACT
  Filled 2015-12-15: qty 3

## 2015-12-15 NOTE — ED Notes (Signed)
Attempted report to floor, unable to take report at this time.

## 2015-12-15 NOTE — ED Notes (Signed)
Pt taken up to ICU with 2 RN's and vanessa with respiratory.

## 2015-12-15 NOTE — ED Notes (Signed)
CRITICAL VALUE ALERT  Critical value received:  Troponin 0.03  Date of notification:  12/15/2015  Time of notification:  [id number]  Critical value read back:Yes.    Nurse who received alert:  Fabio Neighbors RN  MD notified (1st page):  Dr. Dayna Barker  Time of first page:  1437  MD notified (2nd page):  Time of second page:  Responding MD:    Time MD responded:

## 2015-12-15 NOTE — ED Provider Notes (Signed)
[facility] DEPT Provider Note   CSN: [id number] Arrival date & time: 12/15/15  1323  By signing my name below, I, [name], attest that this documentation has been prepared under the direction and in the presence of Desiree Bathe, MD. Electronically Signed: Soijett [name], ED Scribe. 12/15/15. 10:07 AM.   History   Chief Complaint Chief Complaint  Patient presents with  . Shortness of Breath    HPI  [name] is a 70 y.o. female with a PMHx of COPD, who presents to the Emergency Department brought in by EMS complaining of SOB onset PTA. Husband states that he noticed the pt experiencng SOB and he placed her on BiPAP and called EMS. Per EMS report the pt didn't initially respond to a sternal rub, but then eventually responded "yeah". EMS notes that the pt is a DNR. Husband and hospice nurse state that the pt is having associated symptoms of dyspnea and worsening  Mental status over the last few days even while using bipap. Husband denies the pt having chest pain, nausea, vomiting, cough, fever or any other symptoms.    The history is provided by the spouse and the EMS personnel. No language interpreter was used.    Past Medical History:  Diagnosis Date  . Adenomatous colon polyp 07/1996   Tubulovillous adenoma  . Allergic rhinitis   . Anxiety   . Chronic airway obstruction, not elsewhere classified   . 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 Diabetes mellitus, type 2 ([place])   . Diverticulitis 2001  . Diverticulosis   . Duodenal ulcer   . GERD (gastroesophageal reflux disease)   . Hiatal hernia   . Hypertension   . IBS (irritable bowel syndrome)   . Pneumonia   . Segmental colitis ([place]) 07/1996  . Tubulovillous adenoma of colon 1998    Patient Active Problem List   Diagnosis Date Noted  . COPD exacerbation ([place]) 12/15/2015  . Elevated troponin I level 12/15/2015  . Acute encephalopathy 12/15/2015  . Diabetes mellitus, type 2 ([place])     . Pressure ulcer 07/05/2015  . Palliative care encounter   . Dysphagia 07/03/2015  . COPD, very severe ([place])   . Acute respiratory failure ([place]) 06/30/2015  . Anemia due to other cause 06/10/2015  . Lobar pneumonia, unspecified organism ([place]) 06/10/2015  . HCAP (healthcare-associated pneumonia)   . SOB (shortness of breath)   . Notalgia   . Anxiety state   . COPD with acute lower respiratory infection ([place]) 06/09/2015  . Decompensated COPD with exacerbation (chronic obstructive pulmonary disease) ([place]) 06/09/2015  . Acute exacerbation of COPD with asthma ([place])   . Acute on chronic respiratory failure with hypoxia and hypercapnia (HCC)   . 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 Tachycardia 12/02/2010  . Transaminitis 12/01/2010  . COPD with exacerbation ([place]) 11/29/2010  . Hyponatremia 11/29/2010  . Back pain, chronic 11/29/2010  . Hypertension 11/29/2010  . Leukocytosis 11/29/2010  . Oral candidiasis 11/29/2010    Past Surgical History:  Procedure Laterality Date  . ABDOMINAL HYSTERECTOMY  1980  . CATARACT EXTRACTION W/PHACO Left 08/02/2013   Procedure: CATARACT EXTRACTION PHACO AND INTRAOCULAR LENS PLACEMENT (IOC);  Surgeon: Williams Che, MD;  Location: [facility] ORS;  Service: Ophthalmology;  Laterality: Left;  CDE 2.57  . CATARACT EXTRACTION W/PHACO Right 12/20/2013   Procedure: CATARACT EXTRACTION PHACO AND INTRAOCULAR LENS PLACEMENT; CDE: 2.56;  Surgeon: Williams Che, MD;  Location: [facility] ORS;  Service:  Ophthalmology;  Laterality: Right;  . CESAREAN SECTION  1971  . CHOLECYSTECTOMY     ERCP sphincterotomy, stone extraction 2006  . COLECTOMY  07/1999   Sigmoid-chronic diverticulitis  . COLONOSCOPY  05/1999   [place]: Multiple colonic polys (benign polypoid mucosa), very tortuous sigmoid colon  . COLONOSCOPY  01/13/2012   Procedure: COLONOSCOPY;  Surgeon: Daneil Dolin, MD;  Location: [facility] ENDO SUITE;  Service: Endoscopy;  Laterality: N/A;  10:30  . ENDOSCOPIC RETROGRADE  CHOLANGIOPANCREATOGRAPHY (ERCP) WITH PROPOFOL  11/06/2004   RMR: Normal appearing ampulla/Mildly diffusely dilated biliary tree/ status post sphincterotomy and stone extraction as  . ESOPHAGOGASTRODUODENOSCOPY  09/1999   [place]: Gastritis, mild duodenitis  . rod in left leg      OB History    No data available       Home Medications    Prior to Admission medications   Medication Sig Start Date End Date Taking? Authorizing Provider  albuterol (PROAIR HFA) 108 (90 Base) MCG/ACT inhaler Inhale 3 puffs into the lungs every 3 (three) hours as needed for wheezing or shortness of breath.   Yes Historical Provider, MD  albuterol (PROVENTIL) (2.5 MG/3ML) 0.083% nebulizer solution Take 2.5 mg by nebulization every 4 (four) hours. *And take three times daily as needed*   Yes Historical Provider, MD  budesonide-formoterol (SYMBICORT) 160-4.5 MCG/ACT inhaler Inhale 2 puffs into the lungs 2 (two) times daily.   Yes Historical Provider, MD  cetirizine (ZYRTEC) 10 MG tablet Take 10 mg by mouth at bedtime.    Yes Historical Provider, MD  diazepam (VALIUM) 5 MG tablet Take 1 tablet (5 mg total) by mouth every 8 (eight) hours as needed for anxiety. Patient taking differently: Take 5 mg by mouth every 6 (six) hours as needed for anxiety.  07/06/15  Yes Hillary Corinda Gubler, MD  diltiazem (CARDIZEM CD) 180 MG 24 hr capsule Take 180 mg by mouth daily.   Yes Historical Provider, MD  fentaNYL (DURAGESIC - DOSED MCG/HR) 25 MCG/HR patch Place 25 mcg onto the skin every 3 (three) days.   Yes Historical Provider, MD  FLUoxetine (PROZAC) 20 MG capsule Take 60 mg by mouth daily.    Yes Historical Provider, MD  glipiZIDE (GLUCOTROL XL) 5 MG 24 hr tablet Take 5-10 mg by mouth 2 (two) times daily. 2 tablets in the morning and 1 in the evening   Yes Historical Provider, MD  ipratropium (ATROVENT HFA) 17 MCG/ACT inhaler Inhale 2 puffs into the lungs 4 (four) times daily as needed for wheezing.   Yes Historical Provider, MD   lisinopril (PRINIVIL,ZESTRIL) 20 MG tablet Take 20 mg by mouth daily.    Yes Historical Provider, MD  Morphine Sulfate (MORPHINE CONCENTRATE) 10 mg / 0.5 ml concentrated solution Place 5 mg under the tongue every 4 (four) hours as needed for severe pain.   Yes Historical Provider, MD  Morphine Sulfate (MORPHINE CONCENTRATE) 10 mg / 0.5 ml concentrated solution Take 5 mg by mouth every 6 (six) hours. VIA NEBULIZER   Yes Historical Provider, MD  OLANZapine (ZYPREXA) 10 MG tablet Take 10 mg by mouth daily.    Yes Historical Provider, MD  omeprazole (PRILOSEC) 20 MG capsule Take 20 mg by mouth daily.   Yes Historical Provider, MD  oxyCODONE (OXY IR/ROXICODONE) 5 MG immediate release tablet Take 1 tablet (5 mg total) by mouth every 4 (four) hours as needed for moderate pain (dyspnea). Patient taking differently: Take 10 mg by mouth 4 (four) times daily as needed for moderate  pain (dyspnea).  07/06/15  Yes Hillary Corinda Gubler, MD  predniSONE (DELTASONE) 10 MG tablet Take 3 tablets (30 mg total) by mouth daily with breakfast. Patient taking differently: Take 10 mg by mouth daily with breakfast.  07/06/15  Yes Rogue Bussing, MD  simvastatin (ZOCOR) 40 MG tablet Take 40 mg by mouth daily.   Yes Historical Provider, MD  tiotropium (SPIRIVA) 18 MCG inhalation capsule Place 18 mcg into inhaler and inhale daily.   Yes Historical Provider, MD  budesonide (PULMICORT) 0.5 MG/2ML nebulizer solution Take 2 mLs (0.5 mg total) by nebulization 2 (two) times daily. Patient not taking: Reported on 12/15/2015 07/06/15   Rogue Bussing, MD  famotidine (PEPCID) 20 MG tablet Take 1 tablet (20 mg total) by mouth daily. Patient not taking: Reported on 12/15/2015 07/06/15   Rogue Bussing, MD  feeding supplement, GLUCERNA SHAKE, (GLUCERNA SHAKE) LIQD Take 237 mLs by mouth daily with breakfast.     Historical Provider, MD  food thickener (THICK IT) POWD Thicken liquids as described by nutrition Patient  not taking: Reported on 07/01/2015 06/16/15   Asiyah Cletis Media, MD  ibuprofen (ADVIL,MOTRIN) 200 MG tablet Take 800 mg by mouth every 6 (six) hours as needed (pain).    Historical Provider, MD  insulin detemir (LEVEMIR) 100 UNIT/ML injection Inject 0.15 mLs (15 Units total) into the skin daily. 07/06/15   Hillary Corinda Gubler, MD  ipratropium-albuterol (DUONEB) 0.5-2.5 (3) MG/3ML SOLN Take 3 mLs by nebulization every 4 (four) hours as needed. Patient not taking: Reported on 12/15/2015 07/06/15   Rogue Bussing, MD  ipratropium-albuterol (DUONEB) 0.5-2.5 (3) MG/3ML SOLN Take 3 mLs by nebulization 4 (four) times daily. Patient not taking: Reported on 12/15/2015 07/06/15   Rogue Bussing, MD  levofloxacin (LEVAQUIN) 750 MG tablet Take 1 tablet (750 mg total) by mouth daily. Patient not taking: Reported on 12/15/2015 07/06/15   Rogue Bussing, MD  predniSONE (DELTASONE) 20 MG tablet Take 1 tablet (20 mg total) by mouth daily with breakfast. Please start 07/12/15 after completing 30 mg doses. Patient not taking: Reported on 12/15/2015 07/12/15   Rogue Bussing, MD  senna-docusate (SENOKOT-S) 8.6-50 MG tablet Take 1 tablet by mouth as needed for mild constipation. 07/06/15   Hillary Corinda Gubler, MD    Family History Family History  Problem Relation Age of Onset  . Aneurysm Mother   . Lung cancer Father   . Diabetes Sister   . Diabetes Sister     Social History Social History  Substance Use Topics  . Smoking status: Former Smoker    Packs/day: 0.50    Types: Cigarettes    Quit date: 07/01/2010  . Smokeless tobacco: Never Used     Comment: Quit x 1.5 years  . Alcohol use No     Allergies   Aspirin and Nsaids   Review of Systems Review of Systems  All other systems reviewed and are negative.    Physical Exam Updated Vital Signs BP (!) 149/93   Pulse 99   Temp (!) 96.6 F (35.9 C) (Axillary)   Resp (!) 26   Ht 5' (1.524 m)   Wt 179 lb 3.7 oz  (81.3 kg)   SpO2 92%   BMI 35.00 kg/m   Physical Exam  Constitutional: She is oriented to person, place, and time. She appears well-developed and well-nourished. She appears distressed.  Decreased mental status  HENT:  Head: Normocephalic and atraumatic.  Eyes: EOM are normal.  Neck: Neck supple.  Cardiovascular: Normal rate, regular rhythm and normal heart sounds.  Exam reveals no gallop and no friction rub.   No murmur heard. Pulmonary/Chest: Tachypnea noted. She is in respiratory distress. She has decreased breath sounds. She has wheezes. She has no rales.  Abdominal: Soft. She exhibits no distension. There is no tenderness.  Musculoskeletal: Normal range of motion.  Neurological: She is alert and oriented to person, place, and time.  Skin: Skin is warm and dry.  Psychiatric: She has a normal mood and affect. Her behavior is normal.  Nursing note and vitals reviewed.    ED Treatments / Results  DIAGNOSTIC STUDIES: Oxygen Saturation is 94% on [place], low by my interpretation.    COORDINATION OF CARE: 1:27 PM Discussed treatment plan with pt at bedside which includes bipap, breathing treatments, steroids and pt agreed to plan.   Labs (all labs ordered are listed, but only abnormal results are displayed) Labs Reviewed  MRSA PCR SCREENING - Abnormal; Notable for the following:       Result Value   MRSA by PCR POSITIVE (*)    All other components within normal limits  BLOOD GAS, ARTERIAL - Abnormal; Notable for the following:    pH, Arterial 7.201 (*)    pCO2 arterial 103 (*)    pO2, Arterial 58.3 (*)    Bicarbonate 31.8 (*)    Acid-Base Excess 11.0 (*)    All other components within normal limits  BRAIN NATRIURETIC PEPTIDE - Abnormal; Notable for the following:    B Natriuretic Peptide 179.0 (*)    All other components within normal limits  CBC WITH DIFFERENTIAL/PLATELET - Abnormal; Notable for the following:    WBC 20.0 (*)    RBC 3.69 (*)    Hemoglobin 10.7 (*)     MCHC 29.3 (*)    Neutro Abs 17.7 (*)    Monocytes Absolute 1.3 (*)    All other components within normal limits  COMPREHENSIVE METABOLIC PANEL - Abnormal; Notable for the following:    Chloride 93 (*)    CO2 39 (*)    Glucose, Bld 177 (*)    BUN 21 (*)    AST 14 (*)    GFR calc non Af Amer 59 (*)    All other components within normal limits  TROPONIN I - Abnormal; Notable for the following:    Troponin I 0.03 (*)    All other components within normal limits  TROPONIN I - Abnormal; Notable for the following:    Troponin I 0.03 (*)    All other components within normal limits  BLOOD GAS, ARTERIAL - Abnormal; Notable for the following:    pH, Arterial 7.332 (*)    pCO2 arterial 76.2 (*)    pO2, Arterial 77.0 (*)    Bicarbonate 34.6 (*)    All other components within normal limits  BASIC METABOLIC PANEL - Abnormal; Notable for the following:    Chloride 95 (*)    CO2 36 (*)    Glucose, Bld 213 (*)    All other components within normal limits  CBC - Abnormal; Notable for the following:    WBC 13.9 (*)    RBC 3.71 (*)    Hemoglobin 10.9 (*)    HCT 35.3 (*)    All other components within normal limits  BLOOD GAS, ARTERIAL - Abnormal; Notable for the following:    pH, Arterial 7.330 (*)    pCO2 arterial 80.7 (*)    pO2, Arterial 69.2 (*)  Bicarbonate 36.9 (*)    All other components within normal limits  GLUCOSE, CAPILLARY - Abnormal; Notable for the following:    Glucose-Capillary 221 (*)    All other components within normal limits  GLUCOSE, CAPILLARY - Abnormal; Notable for the following:    Glucose-Capillary 202 (*)    All other components within normal limits  TROPONIN I  TROPONIN I    EKG  EKG Interpretation  Date/Time:  Friday December 15 2015 13:27:19 EST Ventricular Rate:  92 PR Interval:    QRS Duration: 99 QT Interval:  371 QTC Calculation: 444 R Axis:   121 Text Interpretation:  Sinus or ectopic atrial rhythm Multiform ventricular premature complexes  Left posterior fascicular block Anterior infarct, old Confirmed by [facility] MD, Erial Fikes [id number]) on 12/15/2015 3:41:43 PM       Radiology Dg Chest Portable 1 View  Result Date: 12/15/2015 CLINICAL DATA:  Shortness of breath. EXAM: PORTABLE CHEST 1 VIEW COMPARISON:  Radiograph of Jul 03, 2015. FINDINGS: Stable cardiomediastinal silhouette. No pneumothorax is noted. Mild central pulmonary vascular congestion is noted. Mild left basilar atelectasis or infiltrate is noted. Old right proximal humeral fracture is noted. IMPRESSION: Mild central pulmonary vascular congestion is noted with mild left basilar atelectasis or infiltrate. Electronically Signed   By: Marijo Conception, M.D.   On: 12/15/2015 15:23    Procedures Procedures (including critical care time)  CRITICAL CARE Performed by: Merrily Pew Total critical care time: 35 minutes Critical care time was exclusive of separately billable procedures and treating other patients. Critical care was necessary to treat or prevent imminent or life-threatening deterioration. Critical care was time spent personally by me on the following activities: development of treatment plan with patient and/or surrogate as well as nursing, discussions with consultants, evaluation of patient's response to treatment, examination of patient, obtaining history from patient or surrogate, ordering and performing treatments and interventions, ordering and review of laboratory studies, ordering and review of radiographic studies, pulse oximetry and re-evaluation of patient's condition.   Medications Ordered in ED Medications  mometasone-formoterol (DULERA) 200-5 MCG/ACT inhaler 2 puff (2 puffs Inhalation Not Given 12/16/15 0821)  diltiazem (CARDIZEM CD) 24 hr capsule 180 mg (180 mg Oral Given 12/16/15 0900)  fentaNYL (DURAGESIC - dosed mcg/hr) patch 25 mcg (25 mcg Transdermal Patch Applied 12/15/15 2030)  morphine CONCENTRATE 10 MG/0.5ML oral solution 5 mg (5 mg Sublingual  Given 12/16/15 0241)  pantoprazole (PROTONIX) EC tablet 40 mg (40 mg Oral Given 12/16/15 0900)  tiotropium (SPIRIVA) inhalation capsule 18 mcg (18 mcg Inhalation Not Given 12/15/15 2000)  famotidine (PEPCID) tablet 20 mg (20 mg Oral Given 12/16/15 0900)  ibuprofen (ADVIL,MOTRIN) tablet 800 mg (not administered)  insulin detemir (LEVEMIR) injection 15 Units (15 Units Subcutaneous Given 12/16/15 0940)  oxyCODONE (Oxy IR/ROXICODONE) immediate release tablet 5 mg (not administered)  senna-docusate (Senokot-S) tablet 1 tablet (not administered)  lisinopril (PRINIVIL,ZESTRIL) tablet 20 mg (20 mg Oral Given 12/16/15 0900)  FLUoxetine (PROZAC) capsule 60 mg (60 mg Oral Given 12/16/15 0900)  OLANZapine (ZYPREXA) tablet 10 mg (10 mg Oral Given 12/16/15 0900)  acetaminophen (TYLENOL) tablet 650 mg (not administered)    Or  acetaminophen (TYLENOL) suppository 650 mg (not administered)  enoxaparin (LOVENOX) injection 40 mg (40 mg Subcutaneous Given 12/15/15 2030)  sodium chloride flush (NS) 0.9 % injection 3 mL (3 mLs Intravenous Given 12/16/15 0900)  ondansetron (ZOFRAN) tablet 4 mg (not administered)    Or  ondansetron (ZOFRAN) injection 4 mg (not administered)  albuterol (PROVENTIL) (2.5  MG/3ML) 0.083% nebulizer solution 2.5 mg (2.5 mg Nebulization Given 12/16/15 0736)  albuterol (PROVENTIL) (2.5 MG/3ML) 0.083% nebulizer solution 2.5 mg (not administered)  guaiFENesin (MUCINEX) 12 hr tablet 600 mg (600 mg Oral Given 12/16/15 0900)  methylPREDNISolone sodium succinate (SOLU-MEDROL) 125 mg/2 mL injection 60 mg (60 mg Intravenous Given 12/16/15 0816)  insulin aspart (novoLOG) injection 0-20 Units (7 Units Subcutaneous Given 12/16/15 0817)  insulin aspart (novoLOG) injection 0-5 Units (2 Units Subcutaneous Given 12/15/15 2234)  atorvastatin (LIPITOR) tablet 20 mg (20 mg Oral Not Given 12/15/15 2034)  chlorhexidine (PERIDEX) 0.12 % solution 15 mL (15 mLs Mouth Rinse Given 12/16/15 0900)  MEDLINE mouth  rinse (not administered)  mupirocin ointment (BACTROBAN) 2 % 1 application (1 application Nasal Given 12/16/15 0900)  Chlorhexidine Gluconate Cloth 2 % PADS 6 each (6 each Topical Given 12/16/15 0511)  ipratropium (ATROVENT) nebulizer solution 0.5 mg (0.5 mg Nebulization Given 12/15/15 1402)  methylPREDNISolone sodium succinate (SOLU-MEDROL) 125 mg/2 mL injection 125 mg (125 mg Intravenous Given 12/15/15 1334)  ipratropium-albuterol (DUONEB) 0.5-2.5 (3) MG/3ML nebulizer solution 3 mL (3 mLs Nebulization Given 12/15/15 1632)  LORazepam (ATIVAN) injection 1 mg (1 mg Intravenous Given 12/15/15 2143)  metoprolol (LOPRESSOR) injection 2.5 mg (2.5 mg Intravenous Given 12/15/15 2235)  diazepam (VALIUM) injection 1 mg (1 mg Intravenous Given 12/16/15 0210)     Initial Impression / Assessment and Plan / ED Course  I have reviewed the triage vital signs and the nursing notes.  Pertinent labs & imaging results that were available during my care of the patient were reviewed by me and considered in my medical decision making (see chart for details).  Clinical Course     Initially patient in without her husband. She was in respiratory distress of you like me to see if the exacerbation. A DNR order came with her so I started her on BiPAP instead of intubation and tried to get a hold of husband but to no avail. ABG c/w co2 retention. D/w medicine about admission but they were hesitant to admit with hospcie in place. Tried consult care management who didn't answer.  Finally husband called back and in concert with hospice nurse, decided to discharge from hospice so patient could be admitted and they would reinstitute on discharge. Confirmed continued DNR/DNI. After reengaging hospitalist and patient will be admitted to stepdown.    Final Clinical Impressions(s) / ED Diagnoses   Final diagnoses:  COPD exacerbation ([place])  Respiratory distress  Acute respiratory failure with hypoxia and hypercapnia ([place])     New Prescriptions Current Discharge Medication List     I personally performed the services described in this documentation, which was scribed in my presence. The recorded information has been reviewed and is accurate.     Merrily Pew, MD 12/16/15 1014

## 2015-12-15 NOTE — H&P (Signed)
History and Physical  [name] [id number] DOB: 1945/10/15 DOA: 12/15/2015  Referring physician: Dr Dayna Barker, ED physician PCP: Glenda Chroman, MD  Outpatient Specialists:   Dr Nyra Capes (Pulmonary)  Chief Complaint: [name], confusion,  HPI: [name] is a 70 y.o. 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 Patient has chronic respiratory failure and is on palliative hospice. She has been off and on BiPAP at home. She's been more confused over the past few days the patient became unresponsive today. The patient's husband placed her on BiPAP and when she did not respond, the patient called EMS for transport to the hospital for evaluation. Here, the patient received steroids and was continued on BiPAP. Her ABGs found her this PCO2 over 100. The patient started to become more alert, however she is still very confused. Unable to assess. In provoking factors. Husband not available for conversation.   Review of Systems:   Not able to obtain  Past Medical History:  Diagnosis Date  . Adenomatous colon polyp 07/1996   Tubulovillous adenoma  . Allergic rhinitis   . Anxiety   . Chronic airway obstruction, not elsewhere classified   . 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 Diabetes mellitus, type 2 ([place])   . Diverticulitis 2001  . Diverticulosis   . Duodenal ulcer   . GERD (gastroesophageal reflux disease)   . Hiatal hernia   . Hypertension   . IBS (irritable bowel syndrome)   . Pneumonia   . 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 Tubulovillous adenoma of colon 1998   Past Surgical History:  Procedure Laterality Date  . ABDOMINAL HYSTERECTOMY  1980  . CATARACT EXTRACTION W/PHACO Left 08/02/2013   Procedure: CATARACT EXTRACTION PHACO AND INTRAOCULAR LENS PLACEMENT (IOC);  Surgeon: Williams Che, MD;  Location: [facility];  Service:  Ophthalmology;  Laterality: Left;  CDE 2.57  . CATARACT EXTRACTION W/PHACO Right 12/20/2013   Procedure: CATARACT EXTRACTION PHACO AND INTRAOCULAR LENS PLACEMENT; CDE: 2.56;  Surgeon: Williams Che, MD;  Location: [facility];  Service: Ophthalmology;  Laterality: Right;  . CESAREAN SECTION  1971  . CHOLECYSTECTOMY     ERCP sphincterotomy, stone extraction 2006  . COLECTOMY  07/1999   Sigmoid-chronic diverticulitis  . COLONOSCOPY  05/1999   [place]: Multiple colonic polys (benign polypoid mucosa), very tortuous sigmoid colon  . COLONOSCOPY  01/13/2012   Procedure: COLONOSCOPY;  Surgeon: Daneil Dolin, MD;  Location: [facility] SUITE;  Service: Endoscopy;  Laterality: N/A;  10:30  . ENDOSCOPIC RETROGRADE CHOLANGIOPANCREATOGRAPHY (ERCP) WITH PROPOFOL  11/06/2004   R[facility]: Normal appearing ampulla/Mildly diffusely dilated biliary tree/ status post sphincterotomy and stone extraction as  . ESOPHAGOGASTRODUODENOSCOPY  09/1999   [place]: Gastritis, mild duodenitis  . rod in left leg     Social History:  reports that she quit smoking about 5 years ago. Her smoking use included Cigarettes. She smoked 0.50 packs per day. She has never used smokeless tobacco. She reports that she does not drink alcohol or use drugs. Patient lives at [facility]  . Aspirin Other (See Comments)    History of gastric ulcers  . Nsaids     Hx of gastric ulcers (tolerates ibuprofen)   Per record: Family History  Problem Relation Age of Onset  . Aneurysm Mother   . Lung cancer Father   . Diabetes Sister   .  Diabetes Sister       Prior to Admission medications   Medication Sig Start Date End Date Taking? Authorizing Provider  albuterol (PROAIR HFA) 108 (90 Base) MCG/ACT inhaler Inhale 3 puffs into the lungs every 3 (three) hours as needed for wheezing or shortness of breath.   Yes Historical Provider, MD  albuterol (PROVENTIL) (2.5 MG/3ML) 0.083% nebulizer solution Take 2.5 mg by nebulization every  4 (four) hours. *And take three times daily as needed*   Yes Historical Provider, MD  budesonide-formoterol (SYMBICORT) 160-4.5 MCG/ACT inhaler Inhale 2 puffs into the lungs 2 (two) times daily.   Yes Historical Provider, MD  cetirizine (ZYRTEC) 10 MG tablet Take 10 mg by mouth at bedtime.    Yes Historical Provider, MD  diazepam (VALIUM) 5 MG tablet Take 1 tablet (5 mg total) by mouth every 8 (eight) hours as needed for anxiety. Patient taking differently: Take 5 mg by mouth every 6 (six) hours as needed for anxiety.  07/06/15  Yes Hillary Corinda Gubler, MD  diltiazem (CARDIZEM CD) 180 MG 24 hr capsule Take 180 mg by mouth daily.   Yes Historical Provider, MD  fentaNYL (DURAGESIC - DOSED MCG/HR) 25 MCG/HR patch Place 25 mcg onto the skin every 3 (three) days.   Yes Historical Provider, MD  FLUoxetine (PROZAC) 20 MG capsule Take 60 mg by mouth daily.    Yes Historical Provider, MD  glipiZIDE (GLUCOTROL XL) 5 MG 24 hr tablet Take 5-10 mg by mouth 2 (two) times daily. 2 tablets in the morning and 1 in the evening   Yes Historical Provider, MD  ipratropium (ATROVENT HFA) 17 MCG/ACT inhaler Inhale 2 puffs into the lungs 4 (four) times daily as needed for wheezing.   Yes Historical Provider, MD  lisinopril (PRINIVIL,ZESTRIL) 20 MG tablet Take 20 mg by mouth daily.    Yes Historical Provider, MD  Morphine Sulfate (MORPHINE CONCENTRATE) 10 mg / 0.5 ml concentrated solution Place 5 mg under the tongue every 4 (four) hours as needed for severe pain.   Yes Historical Provider, MD  Morphine Sulfate (MORPHINE CONCENTRATE) 10 mg / 0.5 ml concentrated solution Take 5 mg by mouth every 6 (six) hours. VIA NEBULIZER   Yes Historical Provider, MD  OLANZapine (ZYPREXA) 10 MG tablet Take 10 mg by mouth daily.    Yes Historical Provider, MD  omeprazole (PRILOSEC) 20 MG capsule Take 20 mg by mouth daily.   Yes Historical Provider, MD  oxyCODONE (OXY IR/ROXICODONE) 5 MG immediate release tablet Take 1 tablet (5 mg total) by  mouth every 4 (four) hours as needed for moderate pain (dyspnea). Patient taking differently: Take 10 mg by mouth 4 (four) times daily as needed for moderate pain (dyspnea).  07/06/15  Yes Hillary Corinda Gubler, MD  predniSONE (DELTASONE) 10 MG tablet Take 3 tablets (30 mg total) by mouth daily with breakfast. Patient taking differently: Take 10 mg by mouth daily with breakfast.  07/06/15  Yes Rogue Bussing, MD  simvastatin (ZOCOR) 40 MG tablet Take 40 mg by mouth daily.   Yes Historical Provider, MD  tiotropium (SPIRIVA) 18 MCG inhalation capsule Place 18 mcg into inhaler and inhale daily.   Yes Historical Provider, MD  budesonide (PULMICORT) 0.5 MG/2ML nebulizer solution Take 2 mLs (0.5 mg total) by nebulization 2 (two) times daily. Patient not taking: Reported on 12/15/2015 07/06/15   Rogue Bussing, MD  famotidine (PEPCID) 20 MG tablet Take 1 tablet (20 mg total) by mouth daily. Patient not taking: Reported on  12/15/2015 07/06/15   Hillary Corinda Gubler, MD  feeding supplement, GLUCERNA SHAKE, (GLUCERNA SHAKE) LIQD Take 237 mLs by mouth daily with breakfast.     Historical Provider, MD  food thickener (THICK IT) POWD Thicken liquids as described by nutrition Patient not taking: Reported on 07/01/2015 06/16/15   Asiyah Cletis Media, MD  ibuprofen (ADVIL,MOTRIN) 200 MG tablet Take 800 mg by mouth every 6 (six) hours as needed (pain).    Historical Provider, MD  insulin detemir (LEVEMIR) 100 UNIT/ML injection Inject 0.15 mLs (15 Units total) into the skin daily. 07/06/15   Hillary Corinda Gubler, MD  ipratropium-albuterol (DUONEB) 0.5-2.5 (3) MG/3ML SOLN Take 3 mLs by nebulization every 4 (four) hours as needed. Patient not taking: Reported on 12/15/2015 07/06/15   Rogue Bussing, MD  ipratropium-albuterol (DUONEB) 0.5-2.5 (3) MG/3ML SOLN Take 3 mLs by nebulization 4 (four) times daily. Patient not taking: Reported on 12/15/2015 07/06/15   Rogue Bussing, MD    levofloxacin (LEVAQUIN) 750 MG tablet Take 1 tablet (750 mg total) by mouth daily. Patient not taking: Reported on 12/15/2015 07/06/15   Rogue Bussing, MD  predniSONE (DELTASONE) 20 MG tablet Take 1 tablet (20 mg total) by mouth daily with breakfast. Please start 07/12/15 after completing 30 mg doses. Patient not taking: Reported on 12/15/2015 07/12/15   Rogue Bussing, MD  senna-docusate (SENOKOT-S) 8.6-50 MG tablet Take 1 tablet by mouth as needed for mild constipation. 07/06/15   Hillary Corinda Gubler, MD    Physical Exam: BP 145/78   Pulse 88   Temp 97.5 F (36.4 C) (Oral)   Resp 22   Ht 5' (1.524 m)   Wt 74.8 kg (165 lb)   SpO2 94%   BMI 32.22 kg/m   General: Elderly Caucasian female. Awake and alert and oriented to person, but not place or time. No acute cardiopulmonary distress.  HEENT: Normocephalic atraumatic.  Right and left ears normal in appearance.  Pupils equal, round, reactive to light. Extraocular muscles are intact. Sclerae anicteric and noninjected.  Moist mucosal membranes. No mucosal lesions.  Neck: Neck supple without lymphadenopathy. No carotid bruits. No masses palpated.  Cardiovascular: Regular rate with normal S1-S2 sounds. No murmurs, rubs, gallops auscultated. No JVD.  Respiratory: Poor respiratory movement. Wheezes throughout. Abdomen: Soft, nontender, nondistended. Active bowel sounds. No masses or hepatosplenomegaly  Skin: No rashes, lesions, or ulcerations.  Dry, warm to touch. 2+ dorsalis pedis and radial pulses. Musculoskeletal: No calf or leg pain. All major joints not erythematous nontender.  No upper or lower joint deformation.  Good ROM.  No contractures  Psychiatric: Intact judgment and insight. Pleasant and cooperative. Neurologic: No focal neurological deficits. Strength is 5/5 and symmetric in upper and lower extremities.  Cranial nerves II through XII are grossly intact.           Labs on Admission: I have personally reviewed  following labs and imaging studies  CBC:  Recent Labs Lab 12/15/15 1337  WBC 20.0*  NEUTROABS 17.7*  HGB 10.7*  HCT 36.5  MCV 98.9  PLT [id number]   Basic Metabolic Panel:  Recent Labs Lab 12/15/15 1337  NA 138  K 4.2  CL 93*  CO2 39*  GLUCOSE 177*  BUN 21*  CREATININE 0.95  CALCIUM 9.2   GFR: Estimated Creatinine Clearance: 49.8 mL/min (by C-G formula based on SCr of 0.95 mg/dL). Liver Function Tests:  Recent Labs Lab 12/15/15 1337  AST 14*  ALT 21  ALKPHOS 54  BILITOT 0.6  PROT 6.7  ALBUMIN 3.6   No results for input(s): LIPASE, AMYLASE in the last 168 hours. No results for input(s): AMMONIA in the last 168 hours. Coagulation Profile: No results for input(s): INR, PROTIME in the last 168 hours. Cardiac Enzymes:  Recent Labs Lab 12/15/15 1337  TROPONINI 0.03*   BNP (last 3 results) No results for input(s): PROBNP in the last 8760 hours. HbA1C: No results for input(s): HGBA1C in the last 72 hours. CBG: No results for input(s): GLUCAP in the last 168 hours. Lipid Profile: No results for input(s): CHOL, HDL, LDLCALC, TRIG, CHOLHDL, LDLDIRECT in the last 72 hours. Thyroid Function Tests: No results for input(s): TSH, T4TOTAL, FREET4, T3FREE, THYROIDAB in the last 72 hours. Anemia Panel: No results for input(s): VITAMINB12, FOLATE, FERRITIN, TIBC, IRON, RETICCTPCT in the last 72 hours. Urine analysis:    Component Value Date/Time   COLORURINE YELLOW 06/30/2015 2116   APPEARANCEUR CLEAR 06/30/2015 2116   LABSPEC 1.021 06/30/2015 2116   PHURINE 6.0 06/30/2015 2116   GLUCOSEU >1000 (A) 06/30/2015 2116   HGBUR NEGATIVE 06/30/2015 2116   [place] NEGATIVE 06/30/2015 2116   KETONESUR 40 (A) 06/30/2015 2116   PROTEINUR NEGATIVE 06/30/2015 2116   NITRITE NEGATIVE 06/30/2015 2116   LEUKOCYTESUR NEGATIVE 06/30/2015 2116   Sepsis Labs: @LABRCNTIP (procalcitonin:4,lacticidven:4) )No results found for this or any previous visit (from the past 240 hour(s)).     Radiological Exams on Admission: Dg Chest Portable 1 View  Result Date: 12/15/2015 CLINICAL DATA:  Shortness of breath. EXAM: PORTABLE CHEST 1 VIEW COMPARISON:  Radiograph of Jul 03, 2015. FINDINGS: Stable cardiomediastinal silhouette. No pneumothorax is noted. Mild central pulmonary vascular congestion is noted. Mild left basilar atelectasis or infiltrate is noted. Old right proximal humeral fracture is noted. IMPRESSION: Mild central pulmonary vascular congestion is noted with mild left basilar atelectasis or infiltrate. Electronically Signed   By: Marijo Conception, M.D.   On: 12/15/2015 15:23    EKG: Independently reviewed. Sinus rhythm. All the form PVCs. Old anterior MI. Posterior fascicular block  Assessment/Plan: Principal Problem:   Acute on chronic respiratory failure with hypoxia and hypercapnia (HCC) Active Problems:   Back pain, chronic   Hypertension   Leukocytosis   COPD exacerbation (HCC)   Elevated troponin I level   Diabetes mellitus, type 2 ([place])   Acute encephalopathy    This patient was discussed with the ED physician, including pertinent vitals, physical exam findings, labs, and imaging.  We also discussed care given by the ED provider.  #1 acute on chronic restaurant failure with hypoxia and hypercapnia  On BiPAP  Repeat ABG tonight and tomorrow  Continuous pulse oximetry  No evidence of infectious etiology or heart failure #2 acute encephalopathy  Secondary to hypercapnia #3 COPD exacerbation  DuoNeb's every 6 scheduled with albuterol every 2 when necessary  Continue inhaled steroids and LA bronchodilator  Solu-Medrol 60 mg IV every 6 hours  Mucinex #4 elevated troponin I level  Repeat troponin now and every 6 hours #5 leukocytosis  No infectious etiologies at this point  Repeat CBC tomorrow, although may be more increased to steroids #6 diabetes type 2  Continue long-acting insulin with slight scale insulin  CBGs before meals and  daily at bedtime  hold glyburide at this point  Patient may need pre-meal insulin in combination with slight scale insulin #7 hypertension  Continue home medication #8 chronic back pain  We'll continue home medication, being cautious with sedating neuro depressant medications.  Hold Valium at this  time  DVT prophylaxis: Lovenox Consultants: None Code Status: DO NOT RESUSCITATE/DO NOT INTUBATE Family Communication: Husband not available for conversation  Disposition Plan: Likely related to return home on hospice after admission   Truett Mainland, DO [facility] Pager [phone]  If 7PM-7AM, please contact night-coverage [email or web address] Password [email or web address]

## 2015-12-15 NOTE — ED Notes (Signed)
Notified bob in ICU that pt is not able to come upstairs at this time due to respiratory therapist being in a critical pts room.  RT is needed for transport with BIPAP.

## 2015-12-15 NOTE — ED Triage Notes (Signed)
Pt brought in by [facility] for c/o sob; ems states pt was tugging when they arrived and found pt on couch; pt is a hospice pt and has a DNR;  Pt given neb treatment en route

## 2015-12-15 NOTE — ED Notes (Signed)
Blood gas results given to Dr. Dayna Barker.

## 2015-12-15 NOTE — ED Notes (Signed)
Dr. Stinson at bedside.  

## 2015-12-15 NOTE — ED Notes (Signed)
Janett Billow, care management on phone at this time speaking with vanessa, RT who heard plan about home health.

## 2015-12-15 NOTE — ED Notes (Signed)
Care management will see pt tomorrow.

## 2015-12-15 NOTE — ED Notes (Signed)
Respiratory at bedside.

## 2015-12-16 LAB — CBC
HEMATOCRIT: 35.3 % — AB (ref 36.0–46.0)
Hemoglobin: 10.9 g/dL — ABNORMAL LOW (ref 12.0–15.0)
MCH: 29.4 pg (ref 26.0–34.0)
MCHC: 30.9 g/dL (ref 30.0–36.0)
MCV: 95.1 fL (ref 78.0–100.0)
PLATELETS: 334 10*3/uL (ref 150–400)
RBC: 3.71 MIL/uL — AB (ref 3.87–5.11)
RDW: 14.1 % (ref 11.5–15.5)
WBC: 13.9 10*3/uL — AB (ref 4.0–10.5)

## 2015-12-16 LAB — BLOOD GAS, ARTERIAL
Bicarbonate: 36.9 mmol/L — ABNORMAL HIGH (ref 20.0–28.0)
DRAWN BY: 22223
Delivery systems: POSITIVE
Expiratory PAP: 8
FIO2: 40
INSPIRATORY PAP: 16
O2 SAT: 92.4 %
PCO2 ART: 80.7 mmHg — AB (ref 32.0–48.0)
PO2 ART: 69.2 mmHg — AB (ref 83.0–108.0)
pH, Arterial: 7.33 — ABNORMAL LOW (ref 7.350–7.450)

## 2015-12-16 LAB — BASIC METABOLIC PANEL
ANION GAP: 8 (ref 5–15)
BUN: 16 mg/dL (ref 6–20)
CALCIUM: 9.1 mg/dL (ref 8.9–10.3)
CO2: 36 mmol/L — ABNORMAL HIGH (ref 22–32)
Chloride: 95 mmol/L — ABNORMAL LOW (ref 101–111)
Creatinine, Ser: 0.93 mg/dL (ref 0.44–1.00)
GFR calc Af Amer: >60 mL/min (ref >60)
GLUCOSE: 213 mg/dL — AB (ref 65–99)
Potassium: 4.7 mmol/L (ref 3.5–5.1)
SODIUM: 139 mmol/L (ref 135–145)

## 2015-12-16 LAB — TROPONIN I
TROPONIN I: 0.03 ng/mL — AB (ref <0.03)
TROPONIN I: 0.03 ng/mL — AB (ref <0.03)

## 2015-12-16 LAB — GLUCOSE, CAPILLARY
GLUCOSE-CAPILLARY: 202 mg/dL — AB (ref 65–99)
GLUCOSE-CAPILLARY: 356 mg/dL — AB (ref 65–99)
Glucose-Capillary: 121 mg/dL — ABNORMAL HIGH (ref 65–99)
Glucose-Capillary: 167 mg/dL — ABNORMAL HIGH (ref 65–99)

## 2015-12-16 MED ORDER — DIAZEPAM 5 MG/ML IJ SOLN
1.0000 mg | Freq: Once | INTRAMUSCULAR | Status: AC
  Administered 2015-12-16: 1 mg via INTRAVENOUS
  Filled 2015-12-16: qty 2

## 2015-12-16 MED ORDER — DOXYCYCLINE HYCLATE 100 MG PO TABS
100.0000 mg | ORAL_TABLET | Freq: Two times a day (BID) | ORAL | Status: DC
  Administered 2015-12-16 – 2015-12-20 (×7): 100 mg via ORAL
  Filled 2015-12-16 (×9): qty 1

## 2015-12-16 NOTE — Progress Notes (Signed)
PROGRESS NOTE    [name]  [id number] DOB: [birth year] DOA: 12/15/2015 PCP: Glenda Chroman, MD    Brief Narrative:  70 y.o. 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 Patient has chronic respiratory failure and is on palliative hospice.  Presented with confusion and increased wob.   Assessment & Plan:   Principal Problem:   Acute on chronic respiratory failure with hypoxia and hypercapnia [facility]) - consulted pulmonology to optimize medical management - continue supplemental oxygen and bipap as needed.    Acute encephalopathy - secondary to principle problem most likely. Improving but patient still confused  Active Problems:   Back pain, chronic - stable    Hypertension - pt on cardizem and lisinopril. Relatively well controlled. Continue to monitor    Leukocytosis - trending down off of antibiotics. Pt place on solumedrol which will also affect this value - add doxycycline    COPD exacerbation ([place]) - continue current medication regimen - consulted Pulm    Elevated troponin I level - most likely due to demand ischemia. Pt denies any chest pain. - Will obtain 12 lead EKG, order placed.    Diabetes mellitus, type 2 ([place]) - on levemir - and SSI with night coverage - continue diabetic diet  DVT prophylaxis: Lovenox Code Status: DNR Family Communication: d/c patient directly Disposition Plan: pending improvement in condition for now stepdown as patient may require bipap   Consultants:   Pulmonology   Procedures: none   Antimicrobials: doxycycline   Subjective: Pt has no new complaints but is confused asking for oxygen tank and asking to be taken to the hospital even though she is at the hospital. Currently has someone observing her.  Objective: Vitals:   12/16/15 0600 12/16/15 0725 12/16/15 0743 12/16/15 0830  BP: (!) 145/102   (!) 149/93    Pulse: 99     Resp: 14 (!) 32  (!) 26  Temp:  (!) 96.6 F (35.9 C)    TempSrc:  Axillary    SpO2: 92%  92%   Weight:      Height:        Intake/Output Summary (Last 24 hours) at 12/16/15 0956 Last data filed at 12/16/15 0805  Gross per 24 hour  Intake              320 ml  Output                0 ml  Net              320 ml   Filed Weights   12/15/15 1328 12/15/15 2000 12/16/15 0400  Weight: 74.8 kg (165 lb) 81.3 kg (179 lb 3.7 oz) 81.3 kg (179 lb 3.7 oz)    Examination:  General exam: Appears calm and comfortable, in nad. Respiratory system: poor inspiratory effort, equal chest rise, no rhales Cardiovascular system: S1 & S2 heard, RRR. No JVD, murmurs, rubs, gallops or clicks.  Gastrointestinal system: Abdomen is nondistended, soft and nontender. No organomegaly or masses felt. Normal bowel sounds heard. Central nervous system: Alert and awake. No focal neurological deficits. Extremities: Symmetric 5 x 5 power. Skin: No rashes, lesions or ulcers Psychiatry: Pt confused and unable to assess psych appropriately.    Data Reviewed: I have personally reviewed following labs and imaging studies  CBC:  Recent Labs Lab 12/15/15 1337 12/16/15 0244  WBC 20.0* 13.9*  NEUTROABS 17.7*  --  HGB 10.7* 10.9*  HCT 36.5 35.3*  MCV 98.9 95.1  PLT 323 [id number]   Basic Metabolic Panel:  Recent Labs Lab 12/15/15 1337 12/16/15 0244  NA 138 139  K 4.2 4.7  CL 93* 95*  CO2 39* 36*  GLUCOSE 177* 213*  BUN 21* 16  CREATININE 0.95 0.93  CALCIUM 9.2 9.1   GFR: Estimated Creatinine Clearance: 53.1 mL/min (by C-G formula based on SCr of 0.93 mg/dL). Liver Function Tests:  Recent Labs Lab 12/15/15 1337  AST 14*  ALT 21  ALKPHOS 54  BILITOT 0.6  PROT 6.7  ALBUMIN 3.6   No results for input(s): LIPASE, AMYLASE in the last 168 hours. No results for input(s): AMMONIA in the last 168 hours. Coagulation Profile: No results for input(s): INR, PROTIME in the last 168  hours. Cardiac Enzymes:  Recent Labs Lab 12/15/15 1337 12/15/15 2037 12/16/15 0244  TROPONINI 0.03* <0.03 0.03*   BNP (last 3 results) No results for input(s): PROBNP in the last 8760 hours. HbA1C: No results for input(s): HGBA1C in the last 72 hours. CBG:  Recent Labs Lab 12/15/15 2224 12/16/15 0724  GLUCAP 221* 202*   Lipid Profile: No results for input(s): CHOL, HDL, LDLCALC, TRIG, CHOLHDL, LDLDIRECT in the last 72 hours. Thyroid Function Tests: No results for input(s): TSH, T4TOTAL, FREET4, T3FREE, THYROIDAB in the last 72 hours. Anemia Panel: No results for input(s): VITAMINB12, FOLATE, FERRITIN, TIBC, IRON, RETICCTPCT in the last 72 hours. Sepsis Labs: No results for input(s): PROCALCITON, LATICACIDVEN in the last 168 hours.  Recent Results (from the past 240 hour(s))  MRSA PCR Screening     Status: Abnormal   Collection Time: 12/15/15  7:24 PM  Result Value Ref Range Status   MRSA by PCR POSITIVE (A) NEGATIVE Final    Comment:        The GeneXpert MRSA Assay (FDA approved for NASAL specimens only), is one component of a comprehensive MRSA colonization surveillance program. It is not intended to diagnose MRSA infection nor to guide or monitor treatment for MRSA infections. RESULT CALLED TO, READ BACK BY AND VERIFIED WITH: HEARN,J AT 2344 ON 12/15/2015 BY EVA          Radiology Studies: Dg Chest Portable 1 View  Result Date: 12/15/2015 CLINICAL DATA:  Shortness of breath. EXAM: PORTABLE CHEST 1 VIEW COMPARISON:  Radiograph of Jul 03, 2015. FINDINGS: Stable cardiomediastinal silhouette. No pneumothorax is noted. Mild central pulmonary vascular congestion is noted. Mild left basilar atelectasis or infiltrate is noted. Old right proximal humeral fracture is noted. IMPRESSION: Mild central pulmonary vascular congestion is noted with mild left basilar atelectasis or infiltrate. Electronically Signed   By: Marijo Conception, M.D.   On: 12/15/2015 15:23         Scheduled Meds: . albuterol  2.5 mg Nebulization Q6H  . atorvastatin  20 mg Oral q1800  . chlorhexidine  15 mL Mouth Rinse BID  . Chlorhexidine Gluconate Cloth  6 each Topical Q0600  . diltiazem  180 mg Oral Daily  . enoxaparin (LOVENOX) injection  40 mg Subcutaneous Q24H  . famotidine  20 mg Oral Daily  . fentaNYL  25 mcg Transdermal Q72H  . FLUoxetine  60 mg Oral Daily  . guaiFENesin  600 mg Oral BID  . insulin aspart  0-20 Units Subcutaneous TID WC  . insulin aspart  0-5 Units Subcutaneous QHS  . insulin detemir  15 Units Subcutaneous Daily  . lisinopril  20 mg Oral Daily  . mouth  rinse  15 mL Mouth Rinse q12n4p  . methylPREDNISolone (SOLU-MEDROL) injection  60 mg Intravenous Q6H  . mometasone-formoterol  2 puff Inhalation BID  . mupirocin ointment  1 application Nasal BID  . OLANZapine  10 mg Oral Daily  . pantoprazole  40 mg Oral Daily  . sodium chloride flush  3 mL Intravenous Q12H  . tiotropium  18 mcg Inhalation Daily   Continuous Infusions:   LOS: 1 day   Time spent: > 35 minutes  Velvet Bathe, MD [facility] Pager [phone]  If 7PM-7AM, please contact night-coverage [email or web address] Password [email or web address] 12/16/2015, 9:56 AM

## 2015-12-16 NOTE — Consult Note (Addendum)
Consult requested by: Dr. Wendee Beavers Consult requested for acute on chronic respiratory failure:  HPI: This is a 70 year old with a long known history of COPD, pulmonary hypertension, diabetes, GERD, systemic hypertension, and chronic hypoxic and hypercapnic respiratory failure. She has been on off BiPAP at home and she is being treated with hospice. She says that she was sick for about a week with increasing shortness of breath and generalized discomfort. Apparently she was found unresponsive on the day of admission started on BiPAP but did not respond to that and EMS was called. PCO2 was over 100 in the emergency department and she was placed on BiPAP again. She has DO NOT RESUSCITATE status. She has been more alert. This morning she says she feels better. She is not back to baseline. She says she's been coughing. Nonproductive. No chest pain nausea vomiting diarrhea and no definite fever or chills. No abdominal pain. No acute neurological symptoms.  Past Medical History:  Diagnosis Date  . Adenomatous colon polyp 07/1996   Tubulovillous adenoma  . Allergic rhinitis   . Anxiety   . Chronic airway obstruction, not elsewhere classified   . 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 Diabetes mellitus, type 2 ([place])   . Diverticulitis 2001  . Diverticulosis   . Duodenal ulcer   . GERD (gastroesophageal reflux disease)   . Hiatal hernia   . Hypertension   . IBS (irritable bowel syndrome)   . Pneumonia   . 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 Tubulovillous adenoma of colon 1998     Family History  Problem Relation Age of Onset  . Aneurysm Mother   . Lung cancer Father   . Diabetes Sister   . Diabetes Sister      Social History   Social History  . Marital status: Married    Spouse name: N/A  . Number of children: 2  . Years of education: N/A   Occupational History  . disabled    Social History Main Topics  . Smoking status: Former Smoker    Packs/day: 0.50     Types: Cigarettes    Quit date: 07/01/2010  . Smokeless tobacco: Never Used     Comment: Quit x 1.5 years  . Alcohol use No  . Drug use: No  . Sexual activity: Not Asked   Other Topics Concern  . None   Social History Narrative  . None     ROS: Except as mentioned above 10 point review of systems is negative specifically no urinary symptoms    Objective: Vital signs in last 24 hours: Temp:  [96.6 F (35.9 C)-99.3 F (37.4 C)] 96.6 F (35.9 C) (11/11 0725) Pulse Rate:  [25-111] 99 (11/11 0600) Resp:  [14-32] 32 (11/11 0725) BP: (104-190)/(52-130) 145/102 (11/11 0600) SpO2:  [69 %-100 %] 92 % (11/11 0743) FiO2 (%):  [40 %] 40 % (11/11 0228) Weight:  [74.8 kg (165 lb)-81.3 kg (179 lb 3.7 oz)] 81.3 kg (179 lb 3.7 oz) (11/11 0400) Weight change:  Last BM Date: 12/13/15  Intake/Output from previous day: No intake/output data recorded.  PHYSICAL EXAM Constitutional: She is on nasal oxygen. She looks pretty comfortable. Her respirations are still about 30. Eyes: Pupils react. EOMI. Ears nose mouth and throat: Mucous membranes are slightly dry. Cardiovascular: Her heart is regular. No gallop. Trace edema. Respiratory: She has been on BiPAP but now on nasal cannula. Respiratory effort is slightly increased. Her lungs are with bilateral rhonchi. Gastrointestinal:  Her abdomen is soft with positive bowel sounds. Neurological: No focal findings. Skin: Warm, thin and dry  Lab Results: Basic Metabolic Panel:  Recent Labs  12/15/15 1337 12/16/15 0244  NA 138 139  K 4.2 4.7  CL 93* 95*  CO2 39* 36*  GLUCOSE 177* 213*  BUN 21* 16  CREATININE 0.95 0.93  CALCIUM 9.2 9.1   Liver Function Tests:  Recent Labs  12/15/15 1337  AST 14*  ALT 21  ALKPHOS 54  BILITOT 0.6  PROT 6.7  ALBUMIN 3.6   No results for input(s): LIPASE, AMYLASE in the last 72 hours. No results for input(s): AMMONIA in the last 72 hours. CBC:  Recent Labs  12/15/15 1337 12/16/15 0244  WBC 20.0*  13.9*  NEUTROABS 17.7*  --   HGB 10.7* 10.9*  HCT 36.5 35.3*  MCV 98.9 95.1  PLT 323 334   Cardiac Enzymes:  Recent Labs  12/15/15 1337 12/15/15 2037 12/16/15 0244  TROPONINI 0.03* <0.03 0.03*   BNP: No results for input(s): PROBNP in the last 72 hours. D-Dimer: No results for input(s): DDIMER in the last 72 hours. CBG:  Recent Labs  12/15/15 2224 12/16/15 0724  GLUCAP 221* 202*   Hemoglobin A1C: No results for input(s): HGBA1C in the last 72 hours. Fasting Lipid Panel: No results for input(s): CHOL, HDL, LDLCALC, TRIG, CHOLHDL, LDLDIRECT in the last 72 hours. Thyroid Function Tests: No results for input(s): TSH, T4TOTAL, FREET4, T3FREE, THYROIDAB in the last 72 hours. Anemia Panel: No results for input(s): VITAMINB12, FOLATE, FERRITIN, TIBC, IRON, RETICCTPCT in the last 72 hours. Coagulation: No results for input(s): LABPROT, INR in the last 72 hours. Urine Drug Screen: Drugs of Abuse     Component Value Date/Time   LABOPIA NONE DETECTED 03/18/2008 0645   COCAINSCRNUR NONE DETECTED 03/18/2008 0645   LABBENZ POSITIVE (A) 03/18/2008 0645   AMPHETMU NONE DETECTED 03/18/2008 0645   THCU NONE DETECTED 03/18/2008 0645   LABBARB  03/18/2008 0645    NONE DETECTED        DRUG SCREEN FOR MEDICAL PURPOSES ONLY.  IF CONFIRMATION IS NEEDED FOR ANY PURPOSE, NOTIFY LAB WITHIN 5 DAYS.        LOWEST DETECTABLE LIMITS FOR URINE DRUG SCREEN Drug Class       Cutoff (ng/mL) Amphetamine      1000 Barbiturate      200 Benzodiazepine   [id number] Tricyclics       [id number] Opiates          300 Cocaine          300 THC              50    Alcohol Level: No results for input(s): ETH in the last 72 hours. Urinalysis: No results for input(s): COLORURINE, LABSPEC, PHURINE, GLUCOSEU, HGBUR, BILIRUBINUR, KETONESUR, PROTEINUR, UROBILINOGEN, NITRITE, LEUKOCYTESUR in the last 72 hours.  Invalid input(s): APPERANCEUR Misc. Labs:   ABGS:  Recent Labs  12/16/15 0630  PHART 7.330*   PO2ART 69.2*  HCO3 36.9*     MICROBIOLOGY: Recent Results (from the past 240 hour(s))  MRSA PCR Screening     Status: Abnormal   Collection Time: 12/15/15  7:24 PM  Result Value Ref Range Status   MRSA by PCR POSITIVE (A) NEGATIVE Final    Comment:        The GeneXpert MRSA Assay (FDA approved for NASAL specimens only), is one component of a comprehensive MRSA colonization surveillance program. It is not intended to diagnose  MRSA infection nor to guide or monitor treatment for MRSA infections. RESULT CALLED TO, READ BACK BY AND VERIFIED WITH: HEARN,J AT 2344 ON 12/15/2015 BY EVA     Studies/Results: Dg Chest Portable 1 View  Result Date: 12/15/2015 CLINICAL DATA:  Shortness of breath. EXAM: PORTABLE CHEST 1 VIEW COMPARISON:  Radiograph of Jul 03, 2015. FINDINGS: Stable cardiomediastinal silhouette. No pneumothorax is noted. Mild central pulmonary vascular congestion is noted. Mild left basilar atelectasis or infiltrate is noted. Old right proximal humeral fracture is noted. IMPRESSION: Mild central pulmonary vascular congestion is noted with mild left basilar atelectasis or infiltrate. Electronically Signed   By: Marijo Conception, M.D.   On: 12/15/2015 15:23    Medications:  Prior to Admission:  Prescriptions Prior to Admission  Medication Sig Dispense Refill Last Dose  . albuterol (PROAIR HFA) 108 (90 Base) MCG/ACT inhaler Inhale 3 puffs into the lungs every 3 (three) hours as needed for wheezing or shortness of breath.   12/14/2015 at Unknown time  . albuterol (PROVENTIL) (2.5 MG/3ML) 0.083% nebulizer solution Take 2.5 mg by nebulization every 4 (four) hours. *And take three times daily as needed*   12/14/2015 at Unknown time  . budesonide-formoterol (SYMBICORT) 160-4.5 MCG/ACT inhaler Inhale 2 puffs into the lungs 2 (two) times daily.   12/14/2015 at Unknown time  . cetirizine (ZYRTEC) 10 MG tablet Take 10 mg by mouth at bedtime.    12/14/2015 at Unknown time  . diazepam  (VALIUM) 5 MG tablet Take 1 tablet (5 mg total) by mouth every 8 (eight) hours as needed for anxiety. (Patient taking differently: Take 5 mg by mouth every 6 (six) hours as needed for anxiety. ) 30 tablet 0 12/14/2015 at Unknown time  . diltiazem (CARDIZEM CD) 180 MG 24 hr capsule Take 180 mg by mouth daily.   12/14/2015 at Unknown time  . fentaNYL (DURAGESIC - DOSED MCG/HR) 25 MCG/HR patch Place 25 mcg onto the skin every 3 (three) days.     Marland Kitchen FLUoxetine (PROZAC) 20 MG capsule Take 60 mg by mouth daily.    12/14/2015 at Unknown time  . glipiZIDE (GLUCOTROL XL) 5 MG 24 hr tablet Take 5-10 mg by mouth 2 (two) times daily. 2 tablets in the morning and 1 in the evening   12/14/2015 at Unknown time  . ipratropium (ATROVENT HFA) 17 MCG/ACT inhaler Inhale 2 puffs into the lungs 4 (four) times daily as needed for wheezing.   12/14/2015 at Unknown time  . lisinopril (PRINIVIL,ZESTRIL) 20 MG tablet Take 20 mg by mouth daily.    12/14/2015 at Unknown time  . Morphine Sulfate (MORPHINE CONCENTRATE) 10 mg / 0.5 ml concentrated solution Place 5 mg under the tongue every 4 (four) hours as needed for severe pain.   12/14/2015 at Unknown time  . Morphine Sulfate (MORPHINE CONCENTRATE) 10 mg / 0.5 ml concentrated solution Take 5 mg by mouth every 6 (six) hours. VIA NEBULIZER   12/14/2015 at Unknown time  . OLANZapine (ZYPREXA) 10 MG tablet Take 10 mg by mouth daily.    12/14/2015 at Unknown time  . omeprazole (PRILOSEC) 20 MG capsule Take 20 mg by mouth daily.   12/14/2015 at Unknown time  . oxyCODONE (OXY IR/ROXICODONE) 5 MG immediate release tablet Take 1 tablet (5 mg total) by mouth every 4 (four) hours as needed for moderate pain (dyspnea). (Patient taking differently: Take 10 mg by mouth 4 (four) times daily as needed for moderate pain (dyspnea). ) 30 tablet 0 12/14/2015 at Unknown  time  . predniSONE (DELTASONE) 10 MG tablet Take 3 tablets (30 mg total) by mouth daily with breakfast. (Patient taking differently: Take 10 mg by  mouth daily with breakfast. ) 15 tablet 0 12/14/2015 at Unknown time  . simvastatin (ZOCOR) 40 MG tablet Take 40 mg by mouth daily.   12/14/2015 at Unknown time  . tiotropium (SPIRIVA) 18 MCG inhalation capsule Place 18 mcg into inhaler and inhale daily.   12/14/2015 at Unknown time  . budesonide (PULMICORT) 0.5 MG/2ML nebulizer solution Take 2 mLs (0.5 mg total) by nebulization 2 (two) times daily. (Patient not taking: Reported on 12/15/2015) 2 mL 12 Not Taking at Unknown time  . famotidine (PEPCID) 20 MG tablet Take 1 tablet (20 mg total) by mouth daily. (Patient not taking: Reported on 12/15/2015) 30 tablet 0 Not Taking at Unknown time  . feeding supplement, GLUCERNA SHAKE, (GLUCERNA SHAKE) LIQD Take 237 mLs by mouth daily with breakfast.    06/28/2015  . food thickener (THICK IT) POWD Thicken liquids as described by nutrition (Patient not taking: Reported on 07/01/2015) 1 Can 0 Not Taking at Unknown time  . ibuprofen (ADVIL,MOTRIN) 200 MG tablet Take 800 mg by mouth every 6 (six) hours as needed (pain).   06/28/2015  . insulin detemir (LEVEMIR) 100 UNIT/ML injection Inject 0.15 mLs (15 Units total) into the skin daily. 10 mL 11   . ipratropium-albuterol (DUONEB) 0.5-2.5 (3) MG/3ML SOLN Take 3 mLs by nebulization every 4 (four) hours as needed. (Patient not taking: Reported on 12/15/2015) 360 mL 0 Not Taking at Unknown time  . ipratropium-albuterol (DUONEB) 0.5-2.5 (3) MG/3ML SOLN Take 3 mLs by nebulization 4 (four) times daily. (Patient not taking: Reported on 12/15/2015) 360 mL 0 Not Taking at Unknown time  . levofloxacin (LEVAQUIN) 750 MG tablet Take 1 tablet (750 mg total) by mouth daily. (Patient not taking: Reported on 12/15/2015) 1 tablet 0 Not Taking at Unknown time  . predniSONE (DELTASONE) 20 MG tablet Take 1 tablet (20 mg total) by mouth daily with breakfast. Please start 07/12/15 after completing 30 mg doses. (Patient not taking: Reported on 12/15/2015) 30 tablet 0 Not Taking at Unknown time  .  senna-docusate (SENOKOT-S) 8.6-50 MG tablet Take 1 tablet by mouth as needed for mild constipation. 30 tablet 0    Scheduled: . albuterol  2.5 mg Nebulization Q6H  . atorvastatin  20 mg Oral q1800  . chlorhexidine  15 mL Mouth Rinse BID  . Chlorhexidine Gluconate Cloth  6 each Topical Q0600  . diltiazem  180 mg Oral Daily  . enoxaparin (LOVENOX) injection  40 mg Subcutaneous Q24H  . famotidine  20 mg Oral Daily  . fentaNYL  25 mcg Transdermal Q72H  . FLUoxetine  60 mg Oral Daily  . guaiFENesin  600 mg Oral BID  . insulin aspart  0-20 Units Subcutaneous TID WC  . insulin aspart  0-5 Units Subcutaneous QHS  . insulin detemir  15 Units Subcutaneous Daily  . lisinopril  20 mg Oral Daily  . mouth rinse  15 mL Mouth Rinse q12n4p  . methylPREDNISolone (SOLU-MEDROL) injection  60 mg Intravenous Q6H  . mometasone-formoterol  2 puff Inhalation BID  . mupirocin ointment  1 application Nasal BID  . OLANZapine  10 mg Oral Daily  . pantoprazole  40 mg Oral Daily  . sodium chloride flush  3 mL Intravenous Q12H  . tiotropium  18 mcg Inhalation Daily   Continuous:  [id number] **OR** acetaminophen, albuterol, ibuprofen, morphine CONCENTRATE, ondansetron **OR** ondansetron (  ZOFRAN) IV, oxyCODONE, senna-docusate  Assesment: She was admitted with acute on chronic hypoxic and hypercapnic respiratory failure with COPD exacerbation. She has elevated troponin but does not seem to have acute coronary syndrome. She was acutely encephalopathic which is better. Principal Problem:   Acute on chronic respiratory failure with hypoxia and hypercapnia (HCC) Active Problems:   Back pain, chronic   Hypertension   Leukocytosis   COPD exacerbation (HCC)   Elevated troponin I level   Diabetes mellitus, type 2 (HCC)   Acute encephalopathy    Plan: Agree with current treatments. She seems to be improving. I would continue IV steroids and inhaled bronchodilators. Agree without purulent sputum I don't think  she needs antibiotic now.  Thanks for allowing me to see her with you    LOS: 1 day   [name] 12/16/2015, 9:11 AM

## 2015-12-16 NOTE — Progress Notes (Signed)
**Note De-identified  Obfuscation** EKG complete; placed in patient chart 

## 2015-12-16 NOTE — Progress Notes (Signed)
[name] notified that pt spits out her pills.  Currently BP is 160/110s.

## 2015-12-16 NOTE — Progress Notes (Signed)
Jonette Eva notified of pt's anxiety, agitation.  Pt scratching, kicking, swinging, and spitting at caregivers.

## 2015-12-17 LAB — GLUCOSE, CAPILLARY
GLUCOSE-CAPILLARY: 233 mg/dL — AB (ref 65–99)
GLUCOSE-CAPILLARY: 209 mg/dL — AB (ref 65–99)
Glucose-Capillary: 168 mg/dL — ABNORMAL HIGH (ref 65–99)
Glucose-Capillary: 211 mg/dL — ABNORMAL HIGH (ref 65–99)
Glucose-Capillary: 226 mg/dL — ABNORMAL HIGH (ref 65–99)

## 2015-12-17 NOTE — Progress Notes (Addendum)
Patient is confused and argumentative. Refusing medications and spitting them out at staff. MD made aware.

## 2015-12-17 NOTE — Progress Notes (Signed)
Subjective: She has been on BiPAP again now is on nasal oxygen but her oxygen saturation has dropped so she is going to have to go back on BiPAP. She's been confused. Blood gas yesterday certainly better on BiPAP. No other new complaints noted.  Objective: Vital signs in last 24 hours: Temp:  [97.2 F (36.2 C)-99 F (37.2 C)] 97.3 F (36.3 C) (11/12 0725) Pulse Rate:  [76-109] 87 (11/12 0900) Resp:  [14-25] 14 (11/12 0900) BP: (99-164)/(53-87) 150/73 (11/12 0900) SpO2:  [86 %-97 %] 95 % (11/12 0900) FiO2 (%):  [40 %] 40 % (11/12 0658) Weight:  [81.1 kg (178 lb 12.7 oz)] 81.1 kg (178 lb 12.7 oz) (11/12 0500) Weight change: 6.257 kg (13 lb 12.7 oz) Last BM Date: 12/13/15  Intake/Output from previous day: 11/11 0701 - 11/12 0700 In: 1060 [P.O.:1060] Out: -   PHYSICAL EXAM General appearance: alert, mild distress and Confused Resp: rhonchi bilaterally and wheezes bilaterally Cardio: regular rate and rhythm, S1, S2 normal, no murmur, click, rub or gallop GI: soft, non-tender; bowel sounds normal; no masses,  no organomegaly Extremities: extremities normal, atraumatic, no cyanosis or edema Skin warm and dry. Mucous membranes are moist.  Lab Results:  Results for orders placed or performed during the hospital encounter of 12/15/15 (from the past 48 hour(s))  Brain natriuretic peptide     Status: Abnormal   Collection Time: 12/15/15  1:37 PM  Result Value Ref Range   B Natriuretic Peptide 179.0 (H) 0.0 - 100.0 pg/mL  CBC with Differential     Status: Abnormal   Collection Time: 12/15/15  1:37 PM  Result Value Ref Range   WBC 20.0 (H) 4.0 - 10.5 K/uL   RBC 3.69 (L) 3.87 - 5.11 MIL/uL   Hemoglobin 10.7 (L) 12.0 - 15.0 g/dL   HCT 36.5 36.0 - 46.0 %   MCV 98.9 78.0 - 100.0 fL   MCH 29.0 26.0 - 34.0 pg   MCHC 29.3 (L) 30.0 - 36.0 g/dL   RDW 14.2 11.5 - 15.5 %   Platelets 323 150 - 400 K/uL   Neutrophils Relative % 88 %   Neutro Abs 17.7 (H) 1.7 - 7.7 K/uL   Lymphocytes Relative  4 %   Lymphs Abs 0.7 0.7 - 4.0 K/uL   Monocytes Relative 7 %   Monocytes Absolute 1.3 (H) 0.1 - 1.0 K/uL   Eosinophils Relative 1 %   Eosinophils Absolute 0.2 0.0 - 0.7 K/uL   Basophils Relative 0 %   Basophils Absolute 0.0 0.0 - 0.1 K/uL  Comprehensive metabolic panel     Status: Abnormal   Collection Time: 12/15/15  1:37 PM  Result Value Ref Range   Sodium 138 135 - 145 mmol/L   Potassium 4.2 3.5 - 5.1 mmol/L   Chloride 93 (L) 101 - 111 mmol/L   CO2 39 (H) 22 - 32 mmol/L   Glucose, Bld 177 (H) 65 - 99 mg/dL   BUN 21 (H) 6 - 20 mg/dL   Creatinine, Ser 0.95 0.44 - 1.00 mg/dL   Calcium 9.2 8.9 - 10.3 mg/dL   Total Protein 6.7 6.5 - 8.1 g/dL   Albumin 3.6 3.5 - 5.0 g/dL   AST 14 (L) 15 - 41 U/L   ALT 21 14 - 54 U/L   Alkaline Phosphatase 54 38 - 126 U/L   Total Bilirubin 0.6 0.3 - 1.2 mg/dL   GFR calc non Af Amer 59 (L) >60 mL/min   GFR calc Af Amer >60 >  60 mL/min    Comment: (NOTE) The eGFR has been calculated using the CKD EPI equation. This calculation has not been validated in all clinical situations. eGFR's persistently <60 mL/min signify possible Chronic Kidney Disease.    Anion gap 6 5 - 15  Troponin I     Status: Abnormal   Collection Time: 12/15/15  1:37 PM  Result Value Ref Range   Troponin I 0.03 (HH) <0.03 ng/mL    Comment: CRITICAL RESULT CALLED TO, READ BACK BY AND VERIFIED WITH: TALBOT,T. AT 6578 ON 12/15/2015 BY EVA   Blood gas, arterial     Status: Abnormal   Collection Time: 12/15/15  3:30 PM  Result Value Ref Range   FIO2 0.40    Delivery systems BILEVEL POSITIVE AIRWAY PRESSURE    LHR 10 resp/min   Inspiratory PAP 14.0    Expiratory PAP 6.0    pH, Arterial 7.201 (L) 7.350 - 7.450   pCO2 arterial 103 (HH) 32.0 - 48.0 mmHg    Comment: CRITICAL RESULT CALLED TO, READ BACK BY AND VERIFIED WITH: MEGAN MITCHELL,RN AT 1540, BY WENDY VIA,RRT,RCP ON 12/15/2015    pO2, Arterial 58.3 (L) 83.0 - 108.0 mmHg   Bicarbonate 31.8 (H) 20.0 - 28.0 mmol/L    Acid-Base Excess 11.0 (H) 0.0 - 2.0 mmol/L   O2 Saturation 86.9 %   Patient temperature 37.0    Collection site LEFT RADIAL    Drawn by [id number]    Sample type ARTERIAL DRAW    Allens test (pass/fail) PASS PASS  MRSA PCR Screening     Status: Abnormal   Collection Time: 12/15/15  7:24 PM  Result Value Ref Range   MRSA by PCR POSITIVE (A) NEGATIVE    Comment:        The GeneXpert MRSA Assay (FDA approved for NASAL specimens only), is one component of a comprehensive MRSA colonization surveillance program. It is not intended to diagnose MRSA infection nor to guide or monitor treatment for MRSA infections. RESULT CALLED TO, READ BACK BY AND VERIFIED WITH: HEARN,J AT 2344 ON 12/15/2015 BY EVA   Troponin I (q 6hr x 3)     Status: None   Collection Time: 12/15/15  8:37 PM  Result Value Ref Range   Troponin I <0.03 <0.03 ng/mL  Blood gas, arterial     Status: Abnormal (Preliminary result)   Collection Time: 12/15/15  9:47 PM  Result Value Ref Range   FIO2 40.00    Delivery systems BILEVEL POSITIVE AIRWAY PRESSURE    LHR 10 resp/min   Inspiratory PAP 16    Expiratory PAP 8    pH, Arterial 7.332 (L) 7.350 - 7.450   pCO2 arterial 76.2 (HH) 32.0 - 48.0 mmHg    Comment: CRITICAL RESULT CALLED TO, READ BACK BY AND VERIFIED WITH: CANDACE,RN BY K KNICK RRT,RCP ON 12/15/2015 CRITICAL RESULT CALLED TO, READ BACK BY AND VERIFIED WITH: CANDANCE,RN BY K KNICK RRT,RCP ON 12/15/2015 AT 2148    pO2, Arterial 77.0 (L) 83.0 - 108.0 mmHg   Bicarbonate 34.6 (H) 20.0 - 28.0 mmol/L   O2 Saturation 94.4 %   Map PENDING cmH20   Collection site RIGHT RADIAL    Drawn by [id number]    Sample type ARTERIAL   Glucose, capillary     Status: Abnormal   Collection Time: 12/15/15 10:24 PM  Result Value Ref Range   Glucose-Capillary 221 (H) 65 - 99 mg/dL   Comment 1 Notify RN    Comment 2 Document in  Chart   Troponin I (q 6hr x 3)     Status: Abnormal   Collection Time: 12/16/15  2:44 AM  Result Value Ref  Range   Troponin I 0.03 (HH) <0.03 ng/mL    Comment: CRITICAL RESULT CALLED TO, READ BACK BY AND VERIFIED WITH: HERN.J AT 0342 ON 11 CRITICAL RESULT CALLED TO, READ BACK BY AND VERIFIED WITH: HERN.J AT 0342 ON 12/16/2015 BY WOODS.M   Basic metabolic panel     Status: Abnormal   Collection Time: 12/16/15  2:44 AM  Result Value Ref Range   Sodium 139 135 - 145 mmol/L   Potassium 4.7 3.5 - 5.1 mmol/L   Chloride 95 (L) 101 - 111 mmol/L   CO2 36 (H) 22 - 32 mmol/L   Glucose, Bld 213 (H) 65 - 99 mg/dL   BUN 16 6 - 20 mg/dL   Creatinine, Ser 0.93 0.44 - 1.00 mg/dL   Calcium 9.1 8.9 - 10.3 mg/dL   GFR calc non Af Amer >60 >60 mL/min   GFR calc Af Amer >60 >60 mL/min    Comment: (NOTE) The eGFR has been calculated using the CKD EPI equation. This calculation has not been validated in all clinical situations. eGFR's persistently <60 mL/min signify possible Chronic Kidney Disease.    Anion gap 8 5 - 15  CBC     Status: Abnormal   Collection Time: 12/16/15  2:44 AM  Result Value Ref Range   WBC 13.9 (H) 4.0 - 10.5 K/uL   RBC 3.71 (L) 3.87 - 5.11 MIL/uL   Hemoglobin 10.9 (L) 12.0 - 15.0 g/dL   HCT 35.3 (L) 36.0 - 46.0 %   MCV 95.1 78.0 - 100.0 fL   MCH 29.4 26.0 - 34.0 pg   MCHC 30.9 30.0 - 36.0 g/dL   RDW 14.1 11.5 - 15.5 %   Platelets 334 150 - 400 K/uL  Blood gas, arterial     Status: Abnormal   Collection Time: 12/16/15  6:30 AM  Result Value Ref Range   FIO2 40.00    Delivery systems BILEVEL POSITIVE AIRWAY PRESSURE    Inspiratory PAP 16    Expiratory PAP 8    pH, Arterial 7.330 (L) 7.350 - 7.450   pCO2 arterial 80.7 (HH) 32.0 - 48.0 mmHg    Comment: CRITICAL RESULT CALLED TO, READ BACK BY AND VERIFIED WITH: JESSICA HEARN,RN BY K KNICK RRT,RCP ON 12/16/2015 AT 0639    pO2, Arterial 69.2 (L) 83.0 - 108.0 mmHg   Bicarbonate 36.9 (H) 20.0 - 28.0 mmol/L   O2 Saturation 92.4 %   Collection site RIGHT RADIAL    Drawn by [id number]    Sample type ARTERIAL    Allens test  (pass/fail) PASS PASS  Glucose, capillary     Status: Abnormal   Collection Time: 12/16/15  7:24 AM  Result Value Ref Range   Glucose-Capillary 202 (H) 65 - 99 mg/dL  Troponin I     Status: Abnormal   Collection Time: 12/16/15  9:08 AM  Result Value Ref Range   Troponin I 0.03 (HH) <0.03 ng/mL    Comment: CRITICAL VALUE NOTED.  VALUE IS CONSISTENT WITH PREVIOUSLY REPORTED AND CALLED VALUE.  Glucose, capillary     Status: Abnormal   Collection Time: 12/16/15 11:35 AM  Result Value Ref Range   Glucose-Capillary 121 (H) 65 - 99 mg/dL  Glucose, capillary     Status: Abnormal   Collection Time: 12/16/15  4:26 PM  Result Value Ref Range  Glucose-Capillary 167 (H) 65 - 99 mg/dL  Glucose, capillary     Status: Abnormal   Collection Time: 12/16/15  8:41 PM  Result Value Ref Range   Glucose-Capillary 356 (H) 65 - 99 mg/dL   Comment 1 Notify RN   Glucose, capillary     Status: Abnormal   Collection Time: 12/17/15 12:57 AM  Result Value Ref Range   Glucose-Capillary 211 (H) 65 - 99 mg/dL  Glucose, capillary     Status: Abnormal   Collection Time: 12/17/15  7:24 AM  Result Value Ref Range   Glucose-Capillary 233 (H) 65 - 99 mg/dL    ABGS  Recent Labs  12/16/15 0630  PHART 7.330*  PO2ART 69.2*  HCO3 36.9*   CULTURES Recent Results (from the past 240 hour(s))  MRSA PCR Screening     Status: Abnormal   Collection Time: 12/15/15  7:24 PM  Result Value Ref Range Status   MRSA by PCR POSITIVE (A) NEGATIVE Final    Comment:        The GeneXpert MRSA Assay (FDA approved for NASAL specimens only), is one component of a comprehensive MRSA colonization surveillance program. It is not intended to diagnose MRSA infection nor to guide or monitor treatment for MRSA infections. RESULT CALLED TO, READ BACK BY AND VERIFIED WITH: HEARN,J AT 2344 ON 12/15/2015 BY EVA    Studies/Results: Dg Chest Portable 1 View  Result Date: 12/15/2015 CLINICAL DATA:  Shortness of breath. EXAM:  PORTABLE CHEST 1 VIEW COMPARISON:  Radiograph of Jul 03, 2015. FINDINGS: Stable cardiomediastinal silhouette. No pneumothorax is noted. Mild central pulmonary vascular congestion is noted. Mild left basilar atelectasis or infiltrate is noted. Old right proximal humeral fracture is noted. IMPRESSION: Mild central pulmonary vascular congestion is noted with mild left basilar atelectasis or infiltrate. Electronically Signed   By: Marijo Conception, M.D.   On: 12/15/2015 15:23    Medications:  Prior to Admission:  Prescriptions Prior to Admission  Medication Sig Dispense Refill Last Dose  . albuterol (PROAIR HFA) 108 (90 Base) MCG/ACT inhaler Inhale 3 puffs into the lungs every 3 (three) hours as needed for wheezing or shortness of breath.   12/14/2015 at Unknown time  . albuterol (PROVENTIL) (2.5 MG/3ML) 0.083% nebulizer solution Take 2.5 mg by nebulization every 4 (four) hours. *And take three times daily as needed*   12/14/2015 at Unknown time  . budesonide-formoterol (SYMBICORT) 160-4.5 MCG/ACT inhaler Inhale 2 puffs into the lungs 2 (two) times daily.   12/14/2015 at Unknown time  . cetirizine (ZYRTEC) 10 MG tablet Take 10 mg by mouth at bedtime.    12/14/2015 at Unknown time  . diazepam (VALIUM) 5 MG tablet Take 1 tablet (5 mg total) by mouth every 8 (eight) hours as needed for anxiety. (Patient taking differently: Take 5 mg by mouth every 6 (six) hours as needed for anxiety. ) 30 tablet 0 12/14/2015 at Unknown time  . diltiazem (CARDIZEM CD) 180 MG 24 hr capsule Take 180 mg by mouth daily.   12/14/2015 at Unknown time  . FLUoxetine (PROZAC) 20 MG capsule Take 60 mg by mouth daily.    12/14/2015 at Unknown time  . glipiZIDE (GLUCOTROL XL) 5 MG 24 hr tablet Take 5-10 mg by mouth 2 (two) times daily. 2 tablets in the morning and 1 in the evening   12/14/2015 at Unknown time  . ipratropium (ATROVENT HFA) 17 MCG/ACT inhaler Inhale 2 puffs into the lungs 4 (four) times daily as needed for wheezing.  12/14/2015 at  Unknown time  . lisinopril (PRINIVIL,ZESTRIL) 20 MG tablet Take 20 mg by mouth daily.    12/14/2015 at Unknown time  . Morphine Sulfate (MORPHINE CONCENTRATE) 10 mg / 0.5 ml concentrated solution Place 5 mg under the tongue every 4 (four) hours as needed for severe pain.   Past Week at Unknown time  . Morphine Sulfate (MORPHINE CONCENTRATE) 10 mg / 0.5 ml concentrated solution Take 5 mg by mouth every 6 (six) hours. VIA NEBULIZER   Past Week at Unknown time  . OLANZapine (ZYPREXA) 10 MG tablet Take 10 mg by mouth daily.    12/14/2015 at Unknown time  . omeprazole (PRILOSEC) 20 MG capsule Take 20 mg by mouth daily.   12/14/2015 at Unknown time  . oxyCODONE (OXY IR/ROXICODONE) 5 MG immediate release tablet Take 1 tablet (5 mg total) by mouth every 4 (four) hours as needed for moderate pain (dyspnea). (Patient taking differently: Take 10 mg by mouth 4 (four) times daily as needed for moderate pain (dyspnea). ) 30 tablet 0 12/14/2015 at Unknown time  . predniSONE (DELTASONE) 10 MG tablet Take 3 tablets (30 mg total) by mouth daily with breakfast. (Patient taking differently: Take 10 mg by mouth daily with breakfast. ) 15 tablet 0 12/14/2015 at Unknown time  . simvastatin (ZOCOR) 40 MG tablet Take 40 mg by mouth daily.   12/14/2015 at Unknown time  . tiotropium (SPIRIVA) 18 MCG inhalation capsule Place 18 mcg into inhaler and inhale daily.   12/14/2015 at Unknown time  . budesonide (PULMICORT) 0.5 MG/2ML nebulizer solution Take 2 mLs (0.5 mg total) by nebulization 2 (two) times daily. (Patient not taking: Reported on 12/15/2015) 2 mL 12 Not Taking at Unknown time  . famotidine (PEPCID) 20 MG tablet Take 1 tablet (20 mg total) by mouth daily. (Patient not taking: Reported on 12/15/2015) 30 tablet 0 Not Taking at Unknown time  . feeding supplement, GLUCERNA SHAKE, (GLUCERNA SHAKE) LIQD Take 237 mLs by mouth daily with breakfast.    06/28/2015  . fentaNYL (DURAGESIC - DOSED MCG/HR) 25 MCG/HR patch Place 25 mcg onto the  skin every 3 (three) days.     . food thickener (THICK IT) POWD Thicken liquids as described by nutrition (Patient not taking: Reported on 07/01/2015) 1 Can 0 Not Taking at Unknown time  . ibuprofen (ADVIL,MOTRIN) 200 MG tablet Take 800 mg by mouth every 6 (six) hours as needed (pain).   06/28/2015  . ipratropium-albuterol (DUONEB) 0.5-2.5 (3) MG/3ML SOLN Take 3 mLs by nebulization every 4 (four) hours as needed. (Patient not taking: Reported on 12/15/2015) 360 mL 0 Not Taking at Unknown time  . ipratropium-albuterol (DUONEB) 0.5-2.5 (3) MG/3ML SOLN Take 3 mLs by nebulization 4 (four) times daily. (Patient not taking: Reported on 12/15/2015) 360 mL 0 Not Taking at Unknown time  . levofloxacin (LEVAQUIN) 750 MG tablet Take 1 tablet (750 mg total) by mouth daily. (Patient not taking: Reported on 12/15/2015) 1 tablet 0 Not Taking at Unknown time  . predniSONE (DELTASONE) 20 MG tablet Take 1 tablet (20 mg total) by mouth daily with breakfast. Please start 07/12/15 after completing 30 mg doses. (Patient not taking: Reported on 12/15/2015) 30 tablet 0 Not Taking at Unknown time  . senna-docusate (SENOKOT-S) 8.6-50 MG tablet Take 1 tablet by mouth as needed for mild constipation. 30 tablet 0    Scheduled: . albuterol  2.5 mg Nebulization Q6H  . atorvastatin  20 mg Oral q1800  . chlorhexidine  15 mL Mouth  Rinse BID  . Chlorhexidine Gluconate Cloth  6 each Topical Q0600  . diltiazem  180 mg Oral Daily  . doxycycline  100 mg Oral Q12H  . enoxaparin (LOVENOX) injection  40 mg Subcutaneous Q24H  . famotidine  20 mg Oral Daily  . fentaNYL  25 mcg Transdermal Q72H  . FLUoxetine  60 mg Oral Daily  . guaiFENesin  600 mg Oral BID  . insulin aspart  0-20 Units Subcutaneous TID WC  . insulin aspart  0-5 Units Subcutaneous QHS  . insulin detemir  15 Units Subcutaneous Daily  . lisinopril  20 mg Oral Daily  . mouth rinse  15 mL Mouth Rinse q12n4p  . methylPREDNISolone (SOLU-MEDROL) injection  60 mg Intravenous Q6H   . mometasone-formoterol  2 puff Inhalation BID  . mupirocin ointment  1 application Nasal BID  . OLANZapine  10 mg Oral Daily  . pantoprazole  40 mg Oral Daily  . sodium chloride flush  3 mL Intravenous Q12H  . tiotropium  18 mcg Inhalation Daily   Continuous:  [id number] **OR** acetaminophen, albuterol, ibuprofen, morphine CONCENTRATE, ondansetron **OR** ondansetron (ZOFRAN) IV, oxyCODONE, senna-docusate  Assesment: She has acute on chronic hyper And hypoxic respiratory failure. She has COPD exacerbation. She has been acutely encephalopathic. She's hypoxic again and is going to go back on BiPAP. Principal Problem:   Acute on chronic respiratory failure with hypoxia and hypercapnia (HCC) Active Problems:   Back pain, chronic   Hypertension   Leukocytosis   COPD exacerbation (HCC)   Elevated troponin I level   Diabetes mellitus, type 2 ([place])   Acute encephalopathy    Plan: Continue current treatments.    LOS: 2 days   Lianny Molter L 12/17/2015, 10:03 AM

## 2015-12-17 NOTE — Progress Notes (Signed)
PROGRESS NOTE    [name]  [id number] DOB: Aug 23, 1945 DOA: 12/15/2015 PCP: Glenda Chroman, MD    Brief Narrative:  70 y.o. 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 Patient has chronic respiratory failure and is on palliative hospice.  Presented with confusion and increased wob.   Assessment & Plan:   Principal Problem:   Acute on chronic respiratory failure with hypoxia and hypercapnia [facility]) - consulted pulmonology to optimize medical management who is currently recommending to continue current regimen - continue supplemental oxygen and bipap as needed.    Acute encephalopathy - secondary to principle problem most likely. Improving but patient still confused  Active Problems:   Back pain, chronic - stable    Hypertension - pt on cardizem and lisinopril. Relatively well controlled. Continue to monitor    Leukocytosis - trending down off of antibiotics. Pt place on solumedrol which will also affect this value - added doxycycline more for copd exacerbation    COPD exacerbation ([place]) - continue current medication regimen - consulted Pulm    Elevated troponin I level - most likely due to demand ischemia. Pt denies any chest pain. - Will obtain 12 lead EKG, order placed.    Diabetes mellitus, type 2 ([place]) - on levemir - and SSI with night coverage - continue diabetic diet  DVT prophylaxis: Lovenox Code Status: DNR Family Communication: d/c patient directly Disposition Plan: pending improvement in condition for now stepdown as patient may require bipap   Consultants:   Pulmonology   Procedures: none   Antimicrobials: doxycycline   Subjective: Pt has no new complaints but is confused asking for oxygen tank and asking to be taken to the hospital even though she is at the hospital. Currently has someone observing her.  Objective: Vitals:   12/17/15 0804 12/17/15 0900 12/17/15 1123 12/17/15 1200  BP:  (!) 150/73  (!) 160/101  Pulse:  87 81 89  Resp:  14 12 (!) 25  Temp:   98.7 F (37.1 C)   TempSrc:   Axillary   SpO2: 97% 95% 93% 92%  Weight:      Height:        Intake/Output Summary (Last 24 hours) at 12/17/15 1306 Last data filed at 12/17/15 1217  Gross per 24 hour  Intake             1100 ml  Output                0 ml  Net             1100 ml   Filed Weights   12/15/15 2000 12/16/15 0400 12/17/15 0500  Weight: 81.3 kg (179 lb 3.7 oz) 81.3 kg (179 lb 3.7 oz) 81.1 kg (178 lb 12.7 oz)    Examination:  General exam: Appears calm and comfortable, in nad. Respiratory system: poor inspiratory effort, equal chest rise, no rhales Cardiovascular system: S1 & S2 heard, RRR. No JVD, murmurs, rubs, gallops or clicks.  Gastrointestinal system: Abdomen is nondistended, soft and nontender. No organomegaly or masses felt. Normal bowel sounds heard. Central nervous system: Alert and awake. No focal neurological deficits. Extremities: Symmetric 5 x 5 power. Skin: No rashes, lesions or ulcers Psychiatry: Pt confused and unable to assess psych appropriately.    Data Reviewed: I have personally reviewed following labs and imaging studies  CBC:  Recent Labs Lab 12/15/15 1337 12/16/15 0244  WBC 20.0*  13.9*  NEUTROABS 17.7*  --   HGB 10.7* 10.9*  HCT 36.5 35.3*  MCV 98.9 95.1  PLT 323 [id number]   Basic Metabolic Panel:  Recent Labs Lab 12/15/15 1337 12/16/15 0244  NA 138 139  K 4.2 4.7  CL 93* 95*  CO2 39* 36*  GLUCOSE 177* 213*  BUN 21* 16  CREATININE 0.95 0.93  CALCIUM 9.2 9.1   GFR: Estimated Creatinine Clearance: 53 mL/min (by C-G formula based on SCr of 0.93 mg/dL). Liver Function Tests:  Recent Labs Lab 12/15/15 1337  AST 14*  ALT 21  ALKPHOS 54  BILITOT 0.6  PROT 6.7  ALBUMIN 3.6   No results for input(s): LIPASE, AMYLASE in the last 168 hours. No results for input(s): AMMONIA in the last 168  hours. Coagulation Profile: No results for input(s): INR, PROTIME in the last 168 hours. Cardiac Enzymes:  Recent Labs Lab 12/15/15 1337 12/15/15 2037 12/16/15 0244 12/16/15 0908  TROPONINI 0.03* <0.03 0.03* 0.03*   BNP (last 3 results) No results for input(s): PROBNP in the last 8760 hours. HbA1C: No results for input(s): HGBA1C in the last 72 hours. CBG:  Recent Labs Lab 12/16/15 1626 12/16/15 2041 12/17/15 0057 12/17/15 0724 12/17/15 1122  GLUCAP 167* 356* 211* 233* 226*   Lipid Profile: No results for input(s): CHOL, HDL, LDLCALC, TRIG, CHOLHDL, LDLDIRECT in the last 72 hours. Thyroid Function Tests: No results for input(s): TSH, T4TOTAL, FREET4, T3FREE, THYROIDAB in the last 72 hours. Anemia Panel: No results for input(s): VITAMINB12, FOLATE, FERRITIN, TIBC, IRON, RETICCTPCT in the last 72 hours. Sepsis Labs: No results for input(s): PROCALCITON, LATICACIDVEN in the last 168 hours.  Recent Results (from the past 240 hour(s))  MRSA PCR Screening     Status: Abnormal   Collection Time: 12/15/15  7:24 PM  Result Value Ref Range Status   MRSA by PCR POSITIVE (A) NEGATIVE Final    Comment:        The GeneXpert MRSA Assay (FDA approved for NASAL specimens only), is one component of a comprehensive MRSA colonization surveillance program. It is not intended to diagnose MRSA infection nor to guide or monitor treatment for MRSA infections. RESULT CALLED TO, READ BACK BY AND VERIFIED WITH: HEARN,J AT 2344 ON 12/15/2015 BY EVA          Radiology Studies: Dg Chest Portable 1 View  Result Date: 12/15/2015 CLINICAL DATA:  Shortness of breath. EXAM: PORTABLE CHEST 1 VIEW COMPARISON:  Radiograph of Jul 03, 2015. FINDINGS: Stable cardiomediastinal silhouette. No pneumothorax is noted. Mild central pulmonary vascular congestion is noted. Mild left basilar atelectasis or infiltrate is noted. Old right proximal humeral fracture is noted. IMPRESSION: Mild central  pulmonary vascular congestion is noted with mild left basilar atelectasis or infiltrate. Electronically Signed   By: Marijo Conception, M.D.   On: 12/15/2015 15:23        Scheduled Meds: . albuterol  2.5 mg Nebulization Q6H  . atorvastatin  20 mg Oral q1800  . chlorhexidine  15 mL Mouth Rinse BID  . Chlorhexidine Gluconate Cloth  6 each Topical Q0600  . diltiazem  180 mg Oral Daily  . doxycycline  100 mg Oral Q12H  . enoxaparin (LOVENOX) injection  40 mg Subcutaneous Q24H  . famotidine  20 mg Oral Daily  . fentaNYL  25 mcg Transdermal Q72H  . FLUoxetine  60 mg Oral Daily  . guaiFENesin  600 mg Oral BID  . insulin aspart  0-20 Units Subcutaneous TID WC  .  insulin aspart  0-5 Units Subcutaneous QHS  . insulin detemir  15 Units Subcutaneous Daily  . lisinopril  20 mg Oral Daily  . mouth rinse  15 mL Mouth Rinse q12n4p  . methylPREDNISolone (SOLU-MEDROL) injection  60 mg Intravenous Q6H  . mometasone-formoterol  2 puff Inhalation BID  . mupirocin ointment  1 application Nasal BID  . OLANZapine  10 mg Oral Daily  . pantoprazole  40 mg Oral Daily  . sodium chloride flush  3 mL Intravenous Q12H  . tiotropium  18 mcg Inhalation Daily   Continuous Infusions:   LOS: 2 days   Time spent: > 35 minutes  Velvet Bathe, MD [facility] Pager [phone]  If 7PM-7AM, please contact night-coverage [email or web address] Password [email or web address] 12/17/2015, 1:06 PM

## 2015-12-18 LAB — CBC
HEMATOCRIT: 33.4 % — AB (ref 36.0–46.0)
Hemoglobin: 10.2 g/dL — ABNORMAL LOW (ref 12.0–15.0)
MCH: 29 pg (ref 26.0–34.0)
MCHC: 30.5 g/dL (ref 30.0–36.0)
MCV: 94.9 fL (ref 78.0–100.0)
Platelets: 346 10*3/uL (ref 150–400)
RBC: 3.52 MIL/uL — ABNORMAL LOW (ref 3.87–5.11)
RDW: 14.1 % (ref 11.5–15.5)
WBC: 10.7 10*3/uL — AB (ref 4.0–10.5)

## 2015-12-18 LAB — GLUCOSE, CAPILLARY
GLUCOSE-CAPILLARY: 317 mg/dL — AB (ref 65–99)
GLUCOSE-CAPILLARY: 296 mg/dL — AB (ref 65–99)
Glucose-Capillary: 241 mg/dL — ABNORMAL HIGH (ref 65–99)
Glucose-Capillary: 159 mg/dL — ABNORMAL HIGH (ref 65–99)

## 2015-12-18 LAB — BASIC METABOLIC PANEL
ANION GAP: 8 (ref 5–15)
BUN: 31 mg/dL — ABNORMAL HIGH (ref 6–20)
CALCIUM: 8.6 mg/dL — AB (ref 8.9–10.3)
CO2: 40 mmol/L — AB (ref 22–32)
Chloride: 89 mmol/L — ABNORMAL LOW (ref 101–111)
Creatinine, Ser: 0.96 mg/dL (ref 0.44–1.00)
GFR, EST NON AFRICAN AMERICAN: 59 mL/min — AB (ref >60)
Glucose, Bld: 342 mg/dL — ABNORMAL HIGH (ref 65–99)
Potassium: 4.4 mmol/L (ref 3.5–5.1)
SODIUM: 137 mmol/L (ref 135–145)

## 2015-12-18 MED ORDER — DIAZEPAM 5 MG PO TABS
5.0000 mg | ORAL_TABLET | Freq: Three times a day (TID) | ORAL | Status: DC | PRN
  Administered 2015-12-18 – 2015-12-19 (×3): 5 mg via ORAL
  Filled 2015-12-18 (×3): qty 1

## 2015-12-18 NOTE — Progress Notes (Deleted)
V.O. Given to restart Cardizem gtt @ 5 mg/hr, hold Po Cardizem while gtt infusing.Georgina Peer, RN 12/18/2015 5:09 PM

## 2015-12-18 NOTE — Care Management Important Message (Signed)
Important Message  Patient Details  Name: ANDERSEN FRY MRN: [id number] Date of Birth: 02-20-1945   Medicare Important Message Given:  Yes    Sherald Barge, RN 12/18/2015, 2:50 PM

## 2015-12-18 NOTE — Progress Notes (Signed)
Subjective: She has no new complaints. She is still confused. She is on BiPAP now. She had been very confused and was refusing BiPAP earlier. No other new complaints have been noted. No reports of nausea vomiting diarrhea chest pain.  Objective: Vital signs in last 24 hours: Temp:  [97.3 F (36.3 C)-99.1 F (37.3 C)] 98.3 F (36.8 C) (11/13 0400) Pulse Rate:  [72-105] 85 (11/13 0515) Resp:  [12-25] 19 (11/13 0515) BP: (142-171)/(63-151) 160/87 (11/13 0515) SpO2:  [91 %-97 %] 94 % (11/13 0530) FiO2 (%):  [40 %] 40 % (11/13 0223) Weight:  [80.6 kg (177 lb 11.1 oz)] 80.6 kg (177 lb 11.1 oz) (11/13 0500) Weight change: -0.5 kg (-1 lb 1.6 oz) Last BM Date: 12/13/15  Intake/Output from previous day: 11/12 0701 - 11/13 0700 In: 1040 [P.O.:1040] Out: -   PHYSICAL EXAM General appearance: Sleepy but will open her eyes on BiPAP looks comfortable. Resp: rhonchi bilaterally Cardio: regular rate and rhythm, S1, S2 normal, no murmur, click, rub or gallop GI: soft, non-tender; bowel sounds normal; no masses,  no organomegaly Extremities: extremities normal, atraumatic, no cyanosis or edema Skin warm and dry. Pupils react  Lab Results:  Results for orders placed or performed during the hospital encounter of 12/15/15 (from the past 48 hour(s))  Troponin I     Status: Abnormal   Collection Time: 12/16/15  9:08 AM  Result Value Ref Range   Troponin I 0.03 (HH) <0.03 ng/mL    Comment: CRITICAL VALUE NOTED.  VALUE IS CONSISTENT WITH PREVIOUSLY REPORTED AND CALLED VALUE.  Glucose, capillary     Status: Abnormal   Collection Time: 12/16/15 11:35 AM  Result Value Ref Range   Glucose-Capillary 121 (H) 65 - 99 mg/dL  Glucose, capillary     Status: Abnormal   Collection Time: 12/16/15  4:26 PM  Result Value Ref Range   Glucose-Capillary 167 (H) 65 - 99 mg/dL  Glucose, capillary     Status: Abnormal   Collection Time: 12/16/15  8:41 PM  Result Value Ref Range   Glucose-Capillary 356 (H) 65 - 99  mg/dL   Comment 1 Notify RN   Glucose, capillary     Status: Abnormal   Collection Time: 12/17/15 12:57 AM  Result Value Ref Range   Glucose-Capillary 211 (H) 65 - 99 mg/dL  Glucose, capillary     Status: Abnormal   Collection Time: 12/17/15  7:24 AM  Result Value Ref Range   Glucose-Capillary 233 (H) 65 - 99 mg/dL  Glucose, capillary     Status: Abnormal   Collection Time: 12/17/15 11:22 AM  Result Value Ref Range   Glucose-Capillary 226 (H) 65 - 99 mg/dL  Glucose, capillary     Status: Abnormal   Collection Time: 12/17/15  3:55 PM  Result Value Ref Range   Glucose-Capillary 209 (H) 65 - 99 mg/dL  Glucose, capillary     Status: Abnormal   Collection Time: 12/17/15  9:22 PM  Result Value Ref Range   Glucose-Capillary 168 (H) 65 - 99 mg/dL   Comment 1 Notify RN    Comment 2 Document in Chart   CBC     Status: Abnormal   Collection Time: 12/18/15  5:23 AM  Result Value Ref Range   WBC 10.7 (H) 4.0 - 10.5 K/uL   RBC 3.52 (L) 3.87 - 5.11 MIL/uL   Hemoglobin 10.2 (L) 12.0 - 15.0 g/dL   HCT 33.4 (L) 36.0 - 46.0 %   MCV 94.9 78.0 - 100.0  fL   MCH 29.0 26.0 - 34.0 pg   MCHC 30.5 30.0 - 36.0 g/dL   RDW 14.1 11.5 - 15.5 %   Platelets 346 150 - 400 K/uL  Basic metabolic panel     Status: Abnormal   Collection Time: 12/18/15  5:23 AM  Result Value Ref Range   Sodium 137 135 - 145 mmol/L   Potassium 4.4 3.5 - 5.1 mmol/L   Chloride 89 (L) 101 - 111 mmol/L   CO2 40 (H) 22 - 32 mmol/L   Glucose, Bld 342 (H) 65 - 99 mg/dL   BUN 31 (H) 6 - 20 mg/dL   Creatinine, Ser 0.96 0.44 - 1.00 mg/dL   Calcium 8.6 (L) 8.9 - 10.3 mg/dL   GFR calc non Af Amer 59 (L) >60 mL/min   GFR calc Af Amer >60 >60 mL/min    Comment: (NOTE) The eGFR has been calculated using the CKD EPI equation. This calculation has not been validated in all clinical situations. eGFR's persistently <60 mL/min signify possible Chronic Kidney Disease.    Anion gap 8 5 - 15  Glucose, capillary     Status: Abnormal    Collection Time: 12/18/15  7:27 AM  Result Value Ref Range   Glucose-Capillary 317 (H) 65 - 99 mg/dL   Comment 1 Notify RN     ABGS  Recent Labs  12/16/15 0630  PHART 7.330*  PO2ART 69.2*  HCO3 36.9*   CULTURES Recent Results (from the past 240 hour(s))  MRSA PCR Screening     Status: Abnormal   Collection Time: 12/15/15  7:24 PM  Result Value Ref Range Status   MRSA by PCR POSITIVE (A) NEGATIVE Final    Comment:        The GeneXpert MRSA Assay (FDA approved for NASAL specimens only), is one component of a comprehensive MRSA colonization surveillance program. It is not intended to diagnose MRSA infection nor to guide or monitor treatment for MRSA infections. RESULT CALLED TO, READ BACK BY AND VERIFIED WITH: HEARN,J AT 2344 ON 12/15/2015 BY EVA    Studies/Results: No results found.  Medications:  Prior to Admission:  Prescriptions Prior to Admission  Medication Sig Dispense Refill Last Dose  . albuterol (PROAIR HFA) 108 (90 Base) MCG/ACT inhaler Inhale 3 puffs into the lungs every 3 (three) hours as needed for wheezing or shortness of breath.   12/14/2015 at Unknown time  . albuterol (PROVENTIL) (2.5 MG/3ML) 0.083% nebulizer solution Take 2.5 mg by nebulization every 4 (four) hours. *And take three times daily as needed*   12/14/2015 at Unknown time  . budesonide-formoterol (SYMBICORT) 160-4.5 MCG/ACT inhaler Inhale 2 puffs into the lungs 2 (two) times daily.   12/14/2015 at Unknown time  . cetirizine (ZYRTEC) 10 MG tablet Take 10 mg by mouth at bedtime.    12/14/2015 at Unknown time  . diazepam (VALIUM) 5 MG tablet Take 1 tablet (5 mg total) by mouth every 8 (eight) hours as needed for anxiety. (Patient taking differently: Take 5 mg by mouth every 6 (six) hours as needed for anxiety. ) 30 tablet 0 12/14/2015 at Unknown time  . diltiazem (CARDIZEM CD) 180 MG 24 hr capsule Take 180 mg by mouth daily.   12/14/2015 at Unknown time  . FLUoxetine (PROZAC) 20 MG capsule Take 60 mg  by mouth daily.    12/14/2015 at Unknown time  . glipiZIDE (GLUCOTROL XL) 5 MG 24 hr tablet Take 5-10 mg by mouth 2 (two) times daily. 2 tablets  in the morning and 1 in the evening   12/14/2015 at Unknown time  . ipratropium (ATROVENT HFA) 17 MCG/ACT inhaler Inhale 2 puffs into the lungs 4 (four) times daily as needed for wheezing.   12/14/2015 at Unknown time  . lisinopril (PRINIVIL,ZESTRIL) 20 MG tablet Take 20 mg by mouth daily.    12/14/2015 at Unknown time  . Morphine Sulfate (MORPHINE CONCENTRATE) 10 mg / 0.5 ml concentrated solution Place 5 mg under the tongue every 4 (four) hours as needed for severe pain.   Past Week at Unknown time  . Morphine Sulfate (MORPHINE CONCENTRATE) 10 mg / 0.5 ml concentrated solution Take 5 mg by mouth every 6 (six) hours. VIA NEBULIZER   Past Week at Unknown time  . OLANZapine (ZYPREXA) 10 MG tablet Take 10 mg by mouth daily.    12/14/2015 at Unknown time  . omeprazole (PRILOSEC) 20 MG capsule Take 20 mg by mouth daily.   12/14/2015 at Unknown time  . oxyCODONE (OXY IR/ROXICODONE) 5 MG immediate release tablet Take 1 tablet (5 mg total) by mouth every 4 (four) hours as needed for moderate pain (dyspnea). (Patient taking differently: Take 10 mg by mouth 4 (four) times daily as needed for moderate pain (dyspnea). ) 30 tablet 0 12/14/2015 at Unknown time  . predniSONE (DELTASONE) 10 MG tablet Take 3 tablets (30 mg total) by mouth daily with breakfast. (Patient taking differently: Take 10 mg by mouth daily with breakfast. ) 15 tablet 0 12/14/2015 at Unknown time  . simvastatin (ZOCOR) 40 MG tablet Take 40 mg by mouth daily.   12/14/2015 at Unknown time  . tiotropium (SPIRIVA) 18 MCG inhalation capsule Place 18 mcg into inhaler and inhale daily.   12/14/2015 at Unknown time  . budesonide (PULMICORT) 0.5 MG/2ML nebulizer solution Take 2 mLs (0.5 mg total) by nebulization 2 (two) times daily. (Patient not taking: Reported on 12/15/2015) 2 mL 12 Not Taking at Unknown time  .  famotidine (PEPCID) 20 MG tablet Take 1 tablet (20 mg total) by mouth daily. (Patient not taking: Reported on 12/15/2015) 30 tablet 0 Not Taking at Unknown time  . feeding supplement, GLUCERNA SHAKE, (GLUCERNA SHAKE) LIQD Take 237 mLs by mouth daily with breakfast.    06/28/2015  . fentaNYL (DURAGESIC - DOSED MCG/HR) 25 MCG/HR patch Place 25 mcg onto the skin every 3 (three) days.     . food thickener (THICK IT) POWD Thicken liquids as described by nutrition (Patient not taking: Reported on 07/01/2015) 1 Can 0 Not Taking at Unknown time  . ibuprofen (ADVIL,MOTRIN) 200 MG tablet Take 800 mg by mouth every 6 (six) hours as needed (pain).   06/28/2015  . ipratropium-albuterol (DUONEB) 0.5-2.5 (3) MG/3ML SOLN Take 3 mLs by nebulization every 4 (four) hours as needed. (Patient not taking: Reported on 12/15/2015) 360 mL 0 Not Taking at Unknown time  . ipratropium-albuterol (DUONEB) 0.5-2.5 (3) MG/3ML SOLN Take 3 mLs by nebulization 4 (four) times daily. (Patient not taking: Reported on 12/15/2015) 360 mL 0 Not Taking at Unknown time  . levofloxacin (LEVAQUIN) 750 MG tablet Take 1 tablet (750 mg total) by mouth daily. (Patient not taking: Reported on 12/15/2015) 1 tablet 0 Not Taking at Unknown time  . predniSONE (DELTASONE) 20 MG tablet Take 1 tablet (20 mg total) by mouth daily with breakfast. Please start 07/12/15 after completing 30 mg doses. (Patient not taking: Reported on 12/15/2015) 30 tablet 0 Not Taking at Unknown time  . senna-docusate (SENOKOT-S) 8.6-50 MG tablet Take 1  tablet by mouth as needed for mild constipation. 30 tablet 0    Scheduled: . albuterol  2.5 mg Nebulization Q6H  . atorvastatin  20 mg Oral q1800  . chlorhexidine  15 mL Mouth Rinse BID  . Chlorhexidine Gluconate Cloth  6 each Topical Q0600  . diltiazem  180 mg Oral Daily  . doxycycline  100 mg Oral Q12H  . enoxaparin (LOVENOX) injection  40 mg Subcutaneous Q24H  . famotidine  20 mg Oral Daily  . fentaNYL  25 mcg Transdermal Q72H   . FLUoxetine  60 mg Oral Daily  . guaiFENesin  600 mg Oral BID  . insulin aspart  0-20 Units Subcutaneous TID WC  . insulin aspart  0-5 Units Subcutaneous QHS  . insulin detemir  15 Units Subcutaneous Daily  . lisinopril  20 mg Oral Daily  . mouth rinse  15 mL Mouth Rinse q12n4p  . methylPREDNISolone (SOLU-MEDROL) injection  60 mg Intravenous Q6H  . mometasone-formoterol  2 puff Inhalation BID  . mupirocin ointment  1 application Nasal BID  . OLANZapine  10 mg Oral Daily  . pantoprazole  40 mg Oral Daily  . sodium chloride flush  3 mL Intravenous Q12H  . tiotropium  18 mcg Inhalation Daily   Continuous:  [id number] **OR** acetaminophen, albuterol, ibuprofen, morphine CONCENTRATE, ondansetron **OR** ondansetron (ZOFRAN) IV, oxyCODONE, senna-docusate  Assesment: She was admitted with acute on chronic hypoxic and hypercapnic respiratory failure. She has BiPAP at home and that was not successful. She's been on BiPAP here but because of her confusion she has not always worn it. She's wearing it now looks a little more comfortable. She is on appropriate treatment for COPD exacerbation. At baseline she is in hospice care. Principal Problem:   Acute on chronic respiratory failure with hypoxia and hypercapnia (HCC) Active Problems:   Back pain, chronic   Hypertension   Leukocytosis   COPD exacerbation (HCC)   Elevated troponin I level   Diabetes mellitus, type 2 ([place])   Acute encephalopathy    Plan: Continue treatments. Continue BiPAP as needed.    LOS: 3 days   Chrisma Hurlock L 12/18/2015, 7:35 AM

## 2015-12-18 NOTE — Progress Notes (Signed)
PROGRESS NOTE    [name]  [id number] DOB: December 08, 1945 DOA: 12/15/2015 PCP: Glenda Chroman, MD    Brief Narrative:  70 y.o. 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 Patient has chronic respiratory failure and is on palliative hospice.  Presented with confusion and increased wob.  Assessment & Plan:   Principal Problem:   Acute on chronic respiratory failure with hypoxia and hypercapnia (HCC) - Continue current regimen.    Acute encephalopathy - secondary to principle problem most likely. Confusion improving.  Active Problems:   Back pain, chronic - stable    Hypertension - pt on cardizem and lisinopril. Relatively well controlled. Continue to monitor    Leukocytosis - trending down on doxycycline although patient is on Solumedrol    COPD exacerbation (HCC) - continue current medication regimen - consulted Pulm    Elevated troponin I level - most likely due to demand ischemia. Pt denies any chest pain. - Will obtain 12 lead EKG, order placed.    Diabetes mellitus, type 2 ([place]) - on levemir - and SSI with night coverage - continue diabetic diet  DVT prophylaxis: Lovenox Code Status: DNR Family Communication: d/c patient directly Disposition Plan: transfer to floor.   Consultants:   Pulmonology   Procedures: none   Antimicrobials: doxycycline   Subjective: Pt has no new complaints reported today.  Objective: Vitals:   12/18/15 1000 12/18/15 1004 12/18/15 1100 12/18/15 1137  BP:      Pulse: 90  85 (!) 109  Resp: 19  20 20   Temp:      TempSrc:      SpO2: 91% 91% 98% 93%  Weight:      Height:        Intake/Output Summary (Last 24 hours) at 12/18/15 1146 Last data filed at 12/18/15 0500  Gross per 24 hour  Intake              800 ml  Output                0 ml  Net              800 ml   Filed Weights   12/16/15 0400  12/17/15 0500 12/18/15 0500  Weight: 81.3 kg (179 lb 3.7 oz) 81.1 kg (178 lb 12.7 oz) 80.6 kg (177 lb 11.1 oz)    Examination:  General exam: Appears calm and comfortable, in nad. Respiratory system: poor inspiratory effort, equal chest rise, no rhales Cardiovascular system: S1 & S2 heard, RRR. No JVD, murmurs, rubs, gallops or clicks.  Gastrointestinal system: Abdomen is nondistended, soft and nontender. No organomegaly or masses felt. Normal bowel sounds heard. Central nervous system: Alert and awake. No focal neurological deficits. Extremities: Symmetric 5 x 5 power. Skin: No rashes, lesions or ulcers Psychiatry: Pt confused and unable to assess psych appropriately.   Data Reviewed: I have personally reviewed following labs and imaging studies  CBC:  Recent Labs Lab 12/15/15 1337 12/16/15 0244 12/18/15 0523  WBC 20.0* 13.9* 10.7*  NEUTROABS 17.7*  --   --   HGB 10.7* 10.9* 10.2*  HCT 36.5 35.3* 33.4*  MCV 98.9 95.1 94.9  PLT 323 [id number]   Basic Metabolic Panel:  Recent Labs Lab 12/15/15 1337 12/16/15 0244 12/18/15 0523  NA 138 139 137  K 4.2 4.7 4.4  CL 93* 95* 89*  CO2 39* 36* 40*  GLUCOSE 177* 213* 342*  BUN 21* 16 31*  CREATININE 0.95 0.93 0.96  CALCIUM 9.2 9.1 8.6*   GFR: Estimated Creatinine Clearance: 51.2 mL/min (by C-G formula based on SCr of 0.96 mg/dL). Liver Function Tests:  Recent Labs Lab 12/15/15 1337  AST 14*  ALT 21  ALKPHOS 54  BILITOT 0.6  PROT 6.7  ALBUMIN 3.6   No results for input(s): LIPASE, AMYLASE in the last 168 hours. No results for input(s): AMMONIA in the last 168 hours. Coagulation Profile: No results for input(s): INR, PROTIME in the last 168 hours. Cardiac Enzymes:  Recent Labs Lab 12/15/15 1337 12/15/15 2037 12/16/15 0244 12/16/15 0908  TROPONINI 0.03* <0.03 0.03* 0.03*   BNP (last 3 results) No results for input(s): PROBNP in the last 8760 hours. HbA1C: No results for input(s): HGBA1C in the last 72  hours. CBG:  Recent Labs Lab 12/17/15 1122 12/17/15 1555 12/17/15 2122 12/18/15 0727 12/18/15 1126  GLUCAP 226* 209* 168* 317* 296*   Lipid Profile: No results for input(s): CHOL, HDL, LDLCALC, TRIG, CHOLHDL, LDLDIRECT in the last 72 hours. Thyroid Function Tests: No results for input(s): TSH, T4TOTAL, FREET4, T3FREE, THYROIDAB in the last 72 hours. Anemia Panel: No results for input(s): VITAMINB12, FOLATE, FERRITIN, TIBC, IRON, RETICCTPCT in the last 72 hours. Sepsis Labs: No results for input(s): PROCALCITON, LATICACIDVEN in the last 168 hours.  Recent Results (from the past 240 hour(s))  MRSA PCR Screening     Status: Abnormal   Collection Time: 12/15/15  7:24 PM  Result Value Ref Range Status   MRSA by PCR POSITIVE (A) NEGATIVE Final    Comment:        The GeneXpert MRSA Assay (FDA approved for NASAL specimens only), is one component of a comprehensive MRSA colonization surveillance program. It is not intended to diagnose MRSA infection nor to guide or monitor treatment for MRSA infections. RESULT CALLED TO, READ BACK BY AND VERIFIED WITH: HEARN,J AT 2344 ON 12/15/2015 BY EVA      Radiology Studies: No results found.  Scheduled Meds: . albuterol  2.5 mg Nebulization Q6H  . atorvastatin  20 mg Oral q1800  . chlorhexidine  15 mL Mouth Rinse BID  . Chlorhexidine Gluconate Cloth  6 each Topical Q0600  . diltiazem  180 mg Oral Daily  . doxycycline  100 mg Oral Q12H  . enoxaparin (LOVENOX) injection  40 mg Subcutaneous Q24H  . famotidine  20 mg Oral Daily  . fentaNYL  25 mcg Transdermal Q72H  . FLUoxetine  60 mg Oral Daily  . guaiFENesin  600 mg Oral BID  . insulin aspart  0-20 Units Subcutaneous TID WC  . insulin aspart  0-5 Units Subcutaneous QHS  . insulin detemir  15 Units Subcutaneous Daily  . lisinopril  20 mg Oral Daily  . mouth rinse  15 mL Mouth Rinse q12n4p  . methylPREDNISolone (SOLU-MEDROL) injection  60 mg Intravenous Q6H  .  mometasone-formoterol  2 puff Inhalation BID  . mupirocin ointment  1 application Nasal BID  . OLANZapine  10 mg Oral Daily  . pantoprazole  40 mg Oral Daily  . sodium chloride flush  3 mL Intravenous Q12H  . tiotropium  18 mcg Inhalation Daily   Continuous Infusions:   LOS: 3 days   Time spent: > 35 minutes  Velvet Bathe, MD [facility] Pager [phone]  If 7PM-7AM, please contact night-coverage [email or web address] Password [email or web address] 12/18/2015, 11:46 AM

## 2015-12-18 NOTE — Progress Notes (Signed)
Inpatient Diabetes Program Recommendations  AACE/ADA: New Consensus Statement on Inpatient Glycemic Control (2015)  Target Ranges:  Prepandial:   less than 140 mg/dL      Peak postprandial:   less than 180 mg/dL (1-2 hours)      Critically ill patients:  140 - 180 mg/dL   Results for [name], [name] (MRN [id number]) as of 12/18/2015 10:18  Ref. Range 12/17/2015 07:24 12/17/2015 11:22 12/17/2015 15:55 12/17/2015 21:22 12/18/2015 07:27  Glucose-Capillary Latest Ref Range: 65 - 99 mg/dL 233 (H) 226 (H) 209 (H) 168 (H) 317 (H)   Review of Glycemic Control  Diabetes history: DM2 Outpatient Diabetes medications: Glipizide XL 10 mg QAM, Glipizide XL 5 mg QPM Current orders for Inpatient glycemic control: Levemir 15 units daily, Novolog 0-20 units TID with meals, Novolog 0-5 units QHS  Inpatient Diabetes Program Recommendations: Insulin - Basal: Please consider increasing Levemir to 18 units daily. Insulin - Meal Coverage: While inpatient and ordered steroids, please consider ordering Novolog 4 units TID with meals for meal coverage (in addition to Novolog correction scale).  Thanks, Barnie Alderman, RN, MSN, CDE Diabetes Coordinator Inpatient Diabetes Program [phone] (Team Pager from 8am to 5pm)

## 2015-12-18 NOTE — Progress Notes (Signed)
Dr Wendee Beavers made aware of need of Bipap to maintain oxygen sat. V.O. Given to hold floor transfer at this time.Georgina Peer, RN 12/18/2015 5:04 PM

## 2015-12-18 NOTE — Progress Notes (Signed)
Stated she is having trouble breathing. Place back on bipap per her request.

## 2015-12-18 NOTE — Progress Notes (Signed)
Patient needing Bipap to maintain Oxygen sat of >90%. Georgina Peer, RN, 12/18/2015

## 2015-12-19 LAB — GLUCOSE, CAPILLARY
GLUCOSE-CAPILLARY: 214 mg/dL — AB (ref 65–99)
Glucose-Capillary: 228 mg/dL — ABNORMAL HIGH (ref 65–99)
Glucose-Capillary: 369 mg/dL — ABNORMAL HIGH (ref 65–99)
Glucose-Capillary: 263 mg/dL — ABNORMAL HIGH (ref 65–99)

## 2015-12-19 NOTE — Progress Notes (Signed)
Inpatient Diabetes Program Recommendations  AACE/ADA: New Consensus Statement on Inpatient Glycemic Control (2015)  Target Ranges:  Prepandial:   less than 140 mg/dL      Peak postprandial:   less than 180 mg/dL (1-2 hours)      Critically ill patients:  140 - 180 mg/dL   Results for [name], [name] (MRN [id number]) as of 12/19/2015 09:12  Ref. Range 12/18/2015 07:27 12/18/2015 11:26 12/18/2015 16:33 12/18/2015 21:52 12/19/2015 07:30  Glucose-Capillary Latest Ref Range: 65 - 99 mg/dL 317 (H) 296 (H) 241 (H) 159 (H) 214 (H)   Review of Glycemic Control  Diabetes history: DM2 Outpatient Diabetes medications: Glipizide XL 10 mg QAM, Glipizide XL 5 mg QPM Current orders for Inpatient glycemic control: Levemir 15 units daily, Novolog 0-20 units TID with meals, Novolog 0-5 units QHS  Inpatient Diabetes Program Recommendations: Insulin - Basal: If steroids are continued, please consider increasing Levemir to 18 units daily. Insulin - Meal Coverage: While inpatient and ordered steroids, please consider ordering Novolog 4 units TID with meals for meal coverage (in addition to Novolog correction scale).  Thanks, Barnie Alderman, RN, MSN, CDE Diabetes Coordinator Inpatient Diabetes Program [phone] (Team Pager from 8am to 5pm)

## 2015-12-19 NOTE — Progress Notes (Signed)
PROGRESS NOTE    [name]  [id number] DOB: 1945/05/14 DOA: 12/15/2015 PCP: Glenda Chroman, MD    Brief Narrative:  70 y.o. 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 Patient has chronic respiratory failure and is on palliative hospice.  Presented with confusion and increased wob.  Still having confusion with slow improvement in respiratory condition. Pulmonary assisting.   Assessment & Plan:   Principal Problem:   Acute on chronic respiratory failure with hypoxia and hypercapnia (HCC) - Continue current regimen. - Pulmonologist assisting.    Acute encephalopathy - secondary to principle problem most likely. Confusion improving.  Active Problems:   Back pain, chronic - stable    Hypertension - pt on cardizem and lisinopril. Relatively well controlled. Continue to monitor    Leukocytosis - trending down on doxycycline although patient is on Solumedrol    COPD exacerbation (HCC) - continue current medication regimen - consulted Pulm    Elevated troponin I level - most likely due to demand ischemia. Pt denies any chest pain. - Will obtain 12 lead EKG, order placed.    Diabetes mellitus, type 2 ([place]) - on levemir - and SSI with night coverage - continue diabetic diet  DVT prophylaxis: Lovenox Code Status: DNR Family Communication: d/c patient directly Disposition Plan: transfer to floor.  Consultants:   Pulmonology   Procedures: none   Antimicrobials: doxycycline   Subjective: No new problems reported. Pt on Bipap  Objective: Vitals:   12/19/15 0804 12/19/15 0807 12/19/15 0811 12/19/15 0827  BP:      Pulse:    85  Resp:    (!) 23  Temp:  97.2 F (36.2 C)    TempSrc:  Axillary    SpO2: 94% 94% 94% 91%  Weight:      Height:        Intake/Output Summary (Last 24 hours) at 12/19/15 1008 Last data filed at 12/19/15 0600  Gross per  24 hour  Intake              960 ml  Output              300 ml  Net              660 ml   Filed Weights   12/17/15 0500 12/18/15 0500 12/19/15 0500  Weight: 81.1 kg (178 lb 12.7 oz) 80.6 kg (177 lb 11.1 oz) 80.2 kg (176 lb 12.9 oz)    Examination:  General exam: Appears calm and comfortable, in nad. Respiratory system: Pt on Bipap, equal chest rise, no rhales, no wheezes Cardiovascular system: S1 & S2 heard, RRR. No JVD, murmurs, rubs, gallops or clicks.  Gastrointestinal system: Abdomen is nondistended, soft and nontender. No organomegaly or masses felt. Normal bowel sounds heard. Central nervous system: Alert and awake. No focal neurological deficits. Extremities: Symmetric 5 x 5 power. Skin: No rashes, lesions or ulcers Psychiatry: Pt confused and unable to assess psych appropriately.   Data Reviewed: I have personally reviewed following labs and imaging studies  CBC:  Recent Labs Lab 12/15/15 1337 12/16/15 0244 12/18/15 0523  WBC 20.0* 13.9* 10.7*  NEUTROABS 17.7*  --   --   HGB 10.7* 10.9* 10.2*  HCT 36.5 35.3* 33.4*  MCV 98.9 95.1 94.9  PLT 323 [id number]   Basic Metabolic Panel:  Recent Labs Lab 12/15/15 1337 12/16/15 0244 12/18/15 0523  NA 138 139 137  K  4.2 4.7 4.4  CL 93* 95* 89*  CO2 39* 36* 40*  GLUCOSE 177* 213* 342*  BUN 21* 16 31*  CREATININE 0.95 0.93 0.96  CALCIUM 9.2 9.1 8.6*   GFR: Estimated Creatinine Clearance: 51.1 mL/min (by C-G formula based on SCr of 0.96 mg/dL). Liver Function Tests:  Recent Labs Lab 12/15/15 1337  AST 14*  ALT 21  ALKPHOS 54  BILITOT 0.6  PROT 6.7  ALBUMIN 3.6   No results for input(s): LIPASE, AMYLASE in the last 168 hours. No results for input(s): AMMONIA in the last 168 hours. Coagulation Profile: No results for input(s): INR, PROTIME in the last 168 hours. Cardiac Enzymes:  Recent Labs Lab 12/15/15 1337 12/15/15 2037 12/16/15 0244 12/16/15 0908  TROPONINI 0.03* <0.03 0.03* 0.03*   BNP (last  3 results) No results for input(s): PROBNP in the last 8760 hours. HbA1C: No results for input(s): HGBA1C in the last 72 hours. CBG:  Recent Labs Lab 12/18/15 0727 12/18/15 1126 12/18/15 1633 12/18/15 2152 12/19/15 0730  GLUCAP 317* 296* 241* 159* 214*   Lipid Profile: No results for input(s): CHOL, HDL, LDLCALC, TRIG, CHOLHDL, LDLDIRECT in the last 72 hours. Thyroid Function Tests: No results for input(s): TSH, T4TOTAL, FREET4, T3FREE, THYROIDAB in the last 72 hours. Anemia Panel: No results for input(s): VITAMINB12, FOLATE, FERRITIN, TIBC, IRON, RETICCTPCT in the last 72 hours. Sepsis Labs: No results for input(s): PROCALCITON, LATICACIDVEN in the last 168 hours.  Recent Results (from the past 240 hour(s))  MRSA PCR Screening     Status: Abnormal   Collection Time: 12/15/15  7:24 PM  Result Value Ref Range Status   MRSA by PCR POSITIVE (A) NEGATIVE Final    Comment:        The GeneXpert MRSA Assay (FDA approved for NASAL specimens only), is one component of a comprehensive MRSA colonization surveillance program. It is not intended to diagnose MRSA infection nor to guide or monitor treatment for MRSA infections. RESULT CALLED TO, READ BACK BY AND VERIFIED WITH: HEARN,J AT 2344 ON 12/15/2015 BY EVA      Radiology Studies: No results found.  Scheduled Meds: . albuterol  2.5 mg Nebulization Q6H  . atorvastatin  20 mg Oral q1800  . chlorhexidine  15 mL Mouth Rinse BID  . Chlorhexidine Gluconate Cloth  6 each Topical Q0600  . diltiazem  180 mg Oral Daily  . doxycycline  100 mg Oral Q12H  . enoxaparin (LOVENOX) injection  40 mg Subcutaneous Q24H  . famotidine  20 mg Oral Daily  . fentaNYL  25 mcg Transdermal Q72H  . FLUoxetine  60 mg Oral Daily  . guaiFENesin  600 mg Oral BID  . insulin aspart  0-20 Units Subcutaneous TID WC  . insulin aspart  0-5 Units Subcutaneous QHS  . insulin detemir  15 Units Subcutaneous Daily  . lisinopril  20 mg Oral Daily  . mouth  rinse  15 mL Mouth Rinse q12n4p  . methylPREDNISolone (SOLU-MEDROL) injection  60 mg Intravenous Q6H  . mometasone-formoterol  2 puff Inhalation BID  . mupirocin ointment  1 application Nasal BID  . OLANZapine  10 mg Oral Daily  . pantoprazole  40 mg Oral Daily  . sodium chloride flush  3 mL Intravenous Q12H  . tiotropium  18 mcg Inhalation Daily   Continuous Infusions:   LOS: 4 days   Time spent: > 35 minutes  Velvet Bathe, MD [facility] Pager [phone]  If 7PM-7AM, please contact night-coverage [email or web address] Password  [email or web address] 12/19/2015, 10:08 AM

## 2015-12-19 NOTE — Progress Notes (Signed)
Subjective: She has still required BiPAP. She has been on Cardizem IV as well. This morning she is much more alert not as confused and has no complaints. No complaints of shortness of breath, chest pain, nausea vomiting or diarrhea.  Objective: Vital signs in last 24 hours: Temp:  [96.5 F (35.8 C)-98.5 F (36.9 C)] 97.4 F (36.3 C) (11/14 0400) Pulse Rate:  [70-109] 77 (11/14 0334) Resp:  [11-22] 11 (11/14 0334) BP: (134-154)/(76-80) 154/80 (11/13 1800) SpO2:  [88 %-98 %] 92 % (11/14 0334) FiO2 (%):  [40 %] 40 % (11/14 0131) Weight:  [80.2 kg (176 lb 12.9 oz)] 80.2 kg (176 lb 12.9 oz) (11/14 0500) Weight change: -0.4 kg (-14.1 oz) Last BM Date: 12/13/15  Intake/Output from previous day: 11/13 0701 - 11/14 0700 In: 1080 [P.O.:1080] Out: 300 [Urine:300]  PHYSICAL EXAM General appearance: alert, cooperative and morbidly obese Resp: rhonchi bilaterally Cardio: regular rate and rhythm, S1, S2 normal, no murmur, click, rub or gallop GI: soft, non-tender; bowel sounds normal; no masses,  no organomegaly Extremities: extremities normal, atraumatic, no cyanosis or edema Skin warm and dry. Mucous membranes are moist  Lab Results:  Results for orders placed or performed during the hospital encounter of 12/15/15 (from the past 48 hour(s))  Glucose, capillary     Status: Abnormal   Collection Time: 12/17/15 11:22 AM  Result Value Ref Range   Glucose-Capillary 226 (H) 65 - 99 mg/dL  Glucose, capillary     Status: Abnormal   Collection Time: 12/17/15  3:55 PM  Result Value Ref Range   Glucose-Capillary 209 (H) 65 - 99 mg/dL  Glucose, capillary     Status: Abnormal   Collection Time: 12/17/15  9:22 PM  Result Value Ref Range   Glucose-Capillary 168 (H) 65 - 99 mg/dL   Comment 1 Notify RN    Comment 2 Document in Chart   CBC     Status: Abnormal   Collection Time: 12/18/15  5:23 AM  Result Value Ref Range   WBC 10.7 (H) 4.0 - 10.5 K/uL   RBC 3.52 ([name]) 3.87 - 5.11 MIL/uL    Hemoglobin 10.2 ([name]) 12.0 - 15.0 g/dL   HCT 33.4 ([name]) 36.0 - 46.0 %   MCV 94.9 78.0 - 100.0 fL   MCH 29.0 26.0 - 34.0 pg   MCHC 30.5 30.0 - 36.0 g/dL   RDW 14.1 11.5 - 15.5 %   Platelets 346 150 - 400 K/uL  Basic metabolic panel     Status: Abnormal   Collection Time: 12/18/15  5:23 AM  Result Value Ref Range   Sodium 137 135 - 145 mmol/[name]   Potassium 4.4 3.5 - 5.1 mmol/[name]   Chloride 89 ([name]) 101 - 111 mmol/[name]   CO2 40 (H) 22 - 32 mmol/[name]   Glucose, Bld 342 (H) 65 - 99 mg/dL   BUN 31 (H) 6 - 20 mg/dL   Creatinine, Ser 0.96 0.44 - 1.00 mg/dL   Calcium 8.6 ([name]) 8.9 - 10.3 mg/dL   GFR calc non Af Amer 59 ([name]) >60 mL/min   GFR calc Af Amer >60 >60 mL/min    Comment: (NOTE) The eGFR has been calculated using the CKD EPI equation. This calculation has not been validated in all clinical situations. eGFR's persistently <60 mL/min signify possible Chronic Kidney Disease.    Anion gap 8 5 - 15  Glucose, capillary     Status: Abnormal   Collection Time: 12/18/15  7:27 AM  Result Value Ref Range  Glucose-Capillary 317 (H) 65 - 99 mg/dL   Comment 1 Notify RN   Glucose, capillary     Status: Abnormal   Collection Time: 12/18/15 11:26 AM  Result Value Ref Range   Glucose-Capillary 296 (H) 65 - 99 mg/dL   Comment 1 Notify RN   Glucose, capillary     Status: Abnormal   Collection Time: 12/18/15  4:33 PM  Result Value Ref Range   Glucose-Capillary 241 (H) 65 - 99 mg/dL   Comment 1 Notify RN   Glucose, capillary     Status: Abnormal   Collection Time: 12/18/15  9:52 PM  Result Value Ref Range   Glucose-Capillary 159 (H) 65 - 99 mg/dL  Glucose, capillary     Status: Abnormal   Collection Time: 12/19/15  7:30 AM  Result Value Ref Range   Glucose-Capillary 214 (H) 65 - 99 mg/dL   Comment 1 Notify RN    Comment 2 Document in Chart     ABGS No results for input(s): PHART, PO2ART, TCO2, HCO3 in the last 72 hours.  Invalid input(s): PCO2 CULTURES Recent Results (from the past 240 hour(s))   MRSA PCR Screening     Status: Abnormal   Collection Time: 12/15/15  7:24 PM  Result Value Ref Range Status   MRSA by PCR POSITIVE (A) NEGATIVE Final    Comment:        The GeneXpert MRSA Assay (FDA approved for NASAL specimens only), is one component of a comprehensive MRSA colonization surveillance program. It is not intended to diagnose MRSA infection nor to guide or monitor treatment for MRSA infections. RESULT CALLED TO, READ BACK BY AND VERIFIED WITH: HEARN,J AT 2344 ON 12/15/2015 BY EVA    Studies/Results: No results found.  Medications:  Prior to Admission:  Prescriptions Prior to Admission  Medication Sig Dispense Refill Last Dose  . albuterol (PROAIR HFA) 108 (90 Base) MCG/ACT inhaler Inhale 3 puffs into the lungs every 3 (three) hours as needed for wheezing or shortness of breath.   12/14/2015 at Unknown time  . albuterol (PROVENTIL) (2.5 MG/3ML) 0.083% nebulizer solution Take 2.5 mg by nebulization every 4 (four) hours. *And take three times daily as needed*   12/14/2015 at Unknown time  . budesonide-formoterol (SYMBICORT) 160-4.5 MCG/ACT inhaler Inhale 2 puffs into the lungs 2 (two) times daily.   12/14/2015 at Unknown time  . cetirizine (ZYRTEC) 10 MG tablet Take 10 mg by mouth at bedtime.    12/14/2015 at Unknown time  . diazepam (VALIUM) 5 MG tablet Take 1 tablet (5 mg total) by mouth every 8 (eight) hours as needed for anxiety. (Patient taking differently: Take 5 mg by mouth every 6 (six) hours as needed for anxiety. ) 30 tablet 0 12/14/2015 at Unknown time  . diltiazem (CARDIZEM CD) 180 MG 24 hr capsule Take 180 mg by mouth daily.   12/14/2015 at Unknown time  . FLUoxetine (PROZAC) 20 MG capsule Take 60 mg by mouth daily.    12/14/2015 at Unknown time  . glipiZIDE (GLUCOTROL XL) 5 MG 24 hr tablet Take 5-10 mg by mouth 2 (two) times daily. 2 tablets in the morning and 1 in the evening   12/14/2015 at Unknown time  . ipratropium (ATROVENT HFA) 17 MCG/ACT inhaler Inhale 2  puffs into the lungs 4 (four) times daily as needed for wheezing.   12/14/2015 at Unknown time  . lisinopril (PRINIVIL,ZESTRIL) 20 MG tablet Take 20 mg by mouth daily.    12/14/2015 at Unknown time  .  Morphine Sulfate (MORPHINE CONCENTRATE) 10 mg / 0.5 ml concentrated solution Place 5 mg under the tongue every 4 (four) hours as needed for severe pain.   Past Week at Unknown time  . Morphine Sulfate (MORPHINE CONCENTRATE) 10 mg / 0.5 ml concentrated solution Take 5 mg by mouth every 6 (six) hours. VIA NEBULIZER   Past Week at Unknown time  . OLANZapine (ZYPREXA) 10 MG tablet Take 10 mg by mouth daily.    12/14/2015 at Unknown time  . omeprazole (PRILOSEC) 20 MG capsule Take 20 mg by mouth daily.   12/14/2015 at Unknown time  . oxyCODONE (OXY IR/ROXICODONE) 5 MG immediate release tablet Take 1 tablet (5 mg total) by mouth every 4 (four) hours as needed for moderate pain (dyspnea). (Patient taking differently: Take 10 mg by mouth 4 (four) times daily as needed for moderate pain (dyspnea). ) 30 tablet 0 12/14/2015 at Unknown time  . predniSONE (DELTASONE) 10 MG tablet Take 3 tablets (30 mg total) by mouth daily with breakfast. (Patient taking differently: Take 10 mg by mouth daily with breakfast. ) 15 tablet 0 12/14/2015 at Unknown time  . simvastatin (ZOCOR) 40 MG tablet Take 40 mg by mouth daily.   12/14/2015 at Unknown time  . tiotropium (SPIRIVA) 18 MCG inhalation capsule Place 18 mcg into inhaler and inhale daily.   12/14/2015 at Unknown time  . budesonide (PULMICORT) 0.5 MG/2ML nebulizer solution Take 2 mLs (0.5 mg total) by nebulization 2 (two) times daily. (Patient not taking: Reported on 12/15/2015) 2 mL 12 Not Taking at Unknown time  . famotidine (PEPCID) 20 MG tablet Take 1 tablet (20 mg total) by mouth daily. (Patient not taking: Reported on 12/15/2015) 30 tablet 0 Not Taking at Unknown time  . feeding supplement, GLUCERNA SHAKE, (GLUCERNA SHAKE) LIQD Take 237 mLs by mouth daily with breakfast.     06/28/2015  . fentaNYL (DURAGESIC - DOSED MCG/HR) 25 MCG/HR patch Place 25 mcg onto the skin every 3 (three) days.     . food thickener (THICK IT) POWD Thicken liquids as described by nutrition (Patient not taking: Reported on 07/01/2015) 1 Can 0 Not Taking at Unknown time  . ibuprofen (ADVIL,MOTRIN) 200 MG tablet Take 800 mg by mouth every 6 (six) hours as needed (pain).   06/28/2015  . ipratropium-albuterol (DUONEB) 0.5-2.5 (3) MG/3ML SOLN Take 3 mLs by nebulization every 4 (four) hours as needed. (Patient not taking: Reported on 12/15/2015) 360 mL 0 Not Taking at Unknown time  . ipratropium-albuterol (DUONEB) 0.5-2.5 (3) MG/3ML SOLN Take 3 mLs by nebulization 4 (four) times daily. (Patient not taking: Reported on 12/15/2015) 360 mL 0 Not Taking at Unknown time  . levofloxacin (LEVAQUIN) 750 MG tablet Take 1 tablet (750 mg total) by mouth daily. (Patient not taking: Reported on 12/15/2015) 1 tablet 0 Not Taking at Unknown time  . predniSONE (DELTASONE) 20 MG tablet Take 1 tablet (20 mg total) by mouth daily with breakfast. Please start 07/12/15 after completing 30 mg doses. (Patient not taking: Reported on 12/15/2015) 30 tablet 0 Not Taking at Unknown time  . senna-docusate (SENOKOT-S) 8.6-50 MG tablet Take 1 tablet by mouth as needed for mild constipation. 30 tablet 0    Scheduled: . albuterol  2.5 mg Nebulization Q6H  . atorvastatin  20 mg Oral q1800  . chlorhexidine  15 mL Mouth Rinse BID  . Chlorhexidine Gluconate Cloth  6 each Topical Q0600  . diltiazem  180 mg Oral Daily  . doxycycline  100 mg Oral  Q12H  . enoxaparin (LOVENOX) injection  40 mg Subcutaneous Q24H  . famotidine  20 mg Oral Daily  . fentaNYL  25 mcg Transdermal Q72H  . FLUoxetine  60 mg Oral Daily  . guaiFENesin  600 mg Oral BID  . insulin aspart  0-20 Units Subcutaneous TID WC  . insulin aspart  0-5 Units Subcutaneous QHS  . insulin detemir  15 Units Subcutaneous Daily  . lisinopril  20 mg Oral Daily  . mouth rinse  15 mL  Mouth Rinse q12n4p  . methylPREDNISolone (SOLU-MEDROL) injection  60 mg Intravenous Q6H  . mometasone-formoterol  2 puff Inhalation BID  . mupirocin ointment  1 application Nasal BID  . OLANZapine  10 mg Oral Daily  . pantoprazole  40 mg Oral Daily  . sodium chloride flush  3 mL Intravenous Q12H  . tiotropium  18 mcg Inhalation Daily   Continuous:  [id number] **OR** acetaminophen, albuterol, diazepam, ibuprofen, morphine CONCENTRATE, ondansetron **OR** ondansetron (ZOFRAN) IV, oxyCODONE, senna-docusate  Assesment: She was admitted with acute on chronic hypoxic and hypercapnic respiratory failure. At baseline she has severe COPD and pulmonary hypertension. I think she is probably approaching her baseline now. Principal Problem:   Acute on chronic respiratory failure with hypoxia and hypercapnia (HCC) Active Problems:   Back pain, chronic   Hypertension   Leukocytosis   COPD exacerbation (HCC)   Elevated troponin I level   Diabetes mellitus, type 2 ([place])   Acute encephalopathy    Plan: Continue treatments.    LOS: 4 days   [name] 12/19/2015, 7:54 AM

## 2015-12-19 NOTE — Care Management Note (Signed)
Case Management Note  Patient Details  Name: [name] MRN: [id number] Date of Birth: Nov 30, 1945  Subjective/Objective:                  Pt admitted with COPD exacerbation. She is from home, lives with her husband. She requires assistance with ADL's at baseline. She is active on hospice services prior to admission with [facility]. Pt has supplemental oxygen and BIPAP for use at home PTA. Per husband he plans for pt to return home with resumption of hospice services through [facility]. CM has spoke with Tillie Fantasia, RN with [facility],   Action/Plan: Will cont to follow.   Expected Discharge Date:     12/25/2015             Expected Discharge Plan:  Home w Hospice Care  In-House Referral:  NA  Discharge planning Services  CM Consult  Post Acute Care Choice:  Hospice Choice offered to:  Spouse  HH Arranged:  RN [place] Agency:  Other - See comment [facility])  Status of Service:  In process, will continue to follow  Sherald Barge, RN 12/19/2015, 2:11 PM

## 2015-12-20 DIAGNOSIS — J9621 Acute and chronic respiratory failure with hypoxia: Principal | ICD-10-CM

## 2015-12-20 DIAGNOSIS — J9622 Acute and chronic respiratory failure with hypercapnia: Secondary | ICD-10-CM

## 2015-12-20 LAB — BASIC METABOLIC PANEL
ANION GAP: 7 (ref 5–15)
BUN: 31 mg/dL — AB (ref 6–20)
CO2: 41 mmol/L — AB (ref 22–32)
Calcium: 8.6 mg/dL — ABNORMAL LOW (ref 8.9–10.3)
Chloride: 89 mmol/L — ABNORMAL LOW (ref 101–111)
Creatinine, Ser: 0.8 mg/dL (ref 0.44–1.00)
GFR calc Af Amer: >60 mL/min (ref >60)
GFR calc non Af Amer: >60 mL/min (ref >60)
GLUCOSE: 316 mg/dL — AB (ref 65–99)
POTASSIUM: 4.6 mmol/L (ref 3.5–5.1)
Sodium: 137 mmol/L (ref 135–145)

## 2015-12-20 LAB — CBC
HEMATOCRIT: 33.4 % — AB (ref 36.0–46.0)
Hemoglobin: 10 g/dL — ABNORMAL LOW (ref 12.0–15.0)
MCH: 28.5 pg (ref 26.0–34.0)
MCHC: 29.9 g/dL — ABNORMAL LOW (ref 30.0–36.0)
MCV: 95.2 fL (ref 78.0–100.0)
Platelets: 304 10*3/uL (ref 150–400)
RBC: 3.51 MIL/uL — AB (ref 3.87–5.11)
RDW: 14 % (ref 11.5–15.5)
WBC: 8.9 10*3/uL (ref 4.0–10.5)

## 2015-12-20 LAB — GLUCOSE, CAPILLARY
Glucose-Capillary: 314 mg/dL — ABNORMAL HIGH (ref 65–99)
Glucose-Capillary: 291 mg/dL — ABNORMAL HIGH (ref 65–99)

## 2015-12-20 MED ORDER — PREDNISONE 5 MG PO TABS: ORAL_TABLET | ORAL | 0 refills | Status: AC

## 2015-12-20 NOTE — Clinical Social Work Note (Signed)
Patient is going home with hospice services.  CSW signing off.       Syrianna Schillaci, Clydene Pugh, LCSW

## 2015-12-20 NOTE — Progress Notes (Signed)
Subjective: She is on BiPAP this morning. She is able to arouse and talk to me. She says she feels better. She seems less confused. No other new complaints. Denies chest pain nausea vomiting diarrhea abdominal pain urinary symptoms. Not much cough.  Objective: Vital signs in last 24 hours: Temp:  [96.7 F (35.9 C)-97.8 F (36.6 C)] 96.7 F (35.9 C) (11/15 0400) Pulse Rate:  [58-93] 58 (11/15 0600) Resp:  [12-23] 16 (11/15 0600) BP: (76-161)/(46-114) 130/64 (11/15 0600) SpO2:  [86 %-100 %] 97 % (11/15 0600) FiO2 (%):  [40 %] 40 % (11/15 0600) Weight:  [80.4 kg (177 lb 4 oz)] 80.4 kg (177 lb 4 oz) (11/15 0500) Weight change: 0.2 kg (7.1 oz) Last BM Date: 12/18/15  Intake/Output from previous day: 11/14 0701 - 11/15 0700 In: 240 [P.O.:240] Out: 525 [Urine:525]  PHYSICAL EXAM General appearance: On BiPAP but arousable. Obese. No acute distress Resp: rhonchi bilaterally Cardio: regular rate and rhythm, S1, S2 normal, no murmur, click, rub or gallop GI: soft, non-tender; bowel sounds normal; no masses,  no organomegaly Extremities: extremities normal, atraumatic, no cyanosis or edema Skin warm and dry. Mucous membranes moist. Pupils reactive  Lab Results:  Results for orders placed or performed during the hospital encounter of 12/15/15 (from the past 48 hour(s))  Glucose, capillary     Status: Abnormal   Collection Time: 12/18/15 11:26 AM  Result Value Ref Range   Glucose-Capillary 296 (H) 65 - 99 mg/dL   Comment 1 Notify RN   Glucose, capillary     Status: Abnormal   Collection Time: 12/18/15  4:33 PM  Result Value Ref Range   Glucose-Capillary 241 (H) 65 - 99 mg/dL   Comment 1 Notify RN   Glucose, capillary     Status: Abnormal   Collection Time: 12/18/15  9:52 PM  Result Value Ref Range   Glucose-Capillary 159 (H) 65 - 99 mg/dL  Glucose, capillary     Status: Abnormal   Collection Time: 12/19/15  7:30 AM  Result Value Ref Range   Glucose-Capillary 214 (H) 65 - 99 mg/dL    Comment 1 Notify RN    Comment 2 Document in Chart   Glucose, capillary     Status: Abnormal   Collection Time: 12/19/15 11:40 AM  Result Value Ref Range   Glucose-Capillary 228 (H) 65 - 99 mg/dL   Comment 1 Notify RN    Comment 2 Document in Chart   Glucose, capillary     Status: Abnormal   Collection Time: 12/19/15  4:46 PM  Result Value Ref Range   Glucose-Capillary 369 (H) 65 - 99 mg/dL   Comment 1 Notify RN    Comment 2 Document in Chart   Glucose, capillary     Status: Abnormal   Collection Time: 12/19/15  9:46 PM  Result Value Ref Range   Glucose-Capillary 263 (H) 65 - 99 mg/dL  CBC     Status: Abnormal   Collection Time: 12/20/15  4:50 AM  Result Value Ref Range   WBC 8.9 4.0 - 10.5 K/uL   RBC 3.51 (L) 3.87 - 5.11 MIL/uL   Hemoglobin 10.0 (L) 12.0 - 15.0 g/dL   HCT 33.4 (L) 36.0 - 46.0 %   MCV 95.2 78.0 - 100.0 fL   MCH 28.5 26.0 - 34.0 pg   MCHC 29.9 (L) 30.0 - 36.0 g/dL   RDW 14.0 11.5 - 15.5 %   Platelets 304 150 - 400 K/uL  Basic metabolic panel  Status: Abnormal   Collection Time: 12/20/15  4:50 AM  Result Value Ref Range   Sodium 137 135 - 145 mmol/L   Potassium 4.6 3.5 - 5.1 mmol/L   Chloride 89 (L) 101 - 111 mmol/L   CO2 41 (H) 22 - 32 mmol/L   Glucose, Bld 316 (H) 65 - 99 mg/dL   BUN 31 (H) 6 - 20 mg/dL   Creatinine, Ser 0.80 0.44 - 1.00 mg/dL   Calcium 8.6 (L) 8.9 - 10.3 mg/dL   GFR calc non Af Amer >60 >60 mL/min   GFR calc Af Amer >60 >60 mL/min    Comment: (NOTE) The eGFR has been calculated using the CKD EPI equation. This calculation has not been validated in all clinical situations. eGFR's persistently <60 mL/min signify possible Chronic Kidney Disease.    Anion gap 7 5 - 15  Glucose, capillary     Status: Abnormal   Collection Time: 12/20/15  7:29 AM  Result Value Ref Range   Glucose-Capillary 314 (H) 65 - 99 mg/dL    ABGS No results for input(s): PHART, PO2ART, TCO2, HCO3 in the last 72 hours.  Invalid input(s):  PCO2 CULTURES Recent Results (from the past 240 hour(s))  MRSA PCR Screening     Status: Abnormal   Collection Time: 12/15/15  7:24 PM  Result Value Ref Range Status   MRSA by PCR POSITIVE (A) NEGATIVE Final    Comment:        The GeneXpert MRSA Assay (FDA approved for NASAL specimens only), is one component of a comprehensive MRSA colonization surveillance program. It is not intended to diagnose MRSA infection nor to guide or monitor treatment for MRSA infections. RESULT CALLED TO, READ BACK BY AND VERIFIED WITH: HEARN,J AT 2344 ON 12/15/2015 BY EVA    Studies/Results: No results found.  Medications:  Prior to Admission:  Prescriptions Prior to Admission  Medication Sig Dispense Refill Last Dose  . albuterol (PROAIR HFA) 108 (90 Base) MCG/ACT inhaler Inhale 3 puffs into the lungs every 3 (three) hours as needed for wheezing or shortness of breath.   12/14/2015 at Unknown time  . albuterol (PROVENTIL) (2.5 MG/3ML) 0.083% nebulizer solution Take 2.5 mg by nebulization every 4 (four) hours. *And take three times daily as needed*   12/14/2015 at Unknown time  . budesonide-formoterol (SYMBICORT) 160-4.5 MCG/ACT inhaler Inhale 2 puffs into the lungs 2 (two) times daily.   12/14/2015 at Unknown time  . cetirizine (ZYRTEC) 10 MG tablet Take 10 mg by mouth at bedtime.    12/14/2015 at Unknown time  . diazepam (VALIUM) 5 MG tablet Take 1 tablet (5 mg total) by mouth every 8 (eight) hours as needed for anxiety. (Patient taking differently: Take 5 mg by mouth every 6 (six) hours as needed for anxiety. ) 30 tablet 0 12/14/2015 at Unknown time  . diltiazem (CARDIZEM CD) 180 MG 24 hr capsule Take 180 mg by mouth daily.   12/14/2015 at Unknown time  . FLUoxetine (PROZAC) 20 MG capsule Take 60 mg by mouth daily.    12/14/2015 at Unknown time  . glipiZIDE (GLUCOTROL XL) 5 MG 24 hr tablet Take 5-10 mg by mouth 2 (two) times daily. 2 tablets in the morning and 1 in the evening   12/14/2015 at Unknown time   . ipratropium (ATROVENT HFA) 17 MCG/ACT inhaler Inhale 2 puffs into the lungs 4 (four) times daily as needed for wheezing.   12/14/2015 at Unknown time  . lisinopril (PRINIVIL,ZESTRIL) 20 MG tablet  Take 20 mg by mouth daily.    12/14/2015 at Unknown time  . Morphine Sulfate (MORPHINE CONCENTRATE) 10 mg / 0.5 ml concentrated solution Place 5 mg under the tongue every 4 (four) hours as needed for severe pain.   Past Week at Unknown time  . Morphine Sulfate (MORPHINE CONCENTRATE) 10 mg / 0.5 ml concentrated solution Take 5 mg by mouth every 6 (six) hours. VIA NEBULIZER   Past Week at Unknown time  . OLANZapine (ZYPREXA) 10 MG tablet Take 10 mg by mouth daily.    12/14/2015 at Unknown time  . omeprazole (PRILOSEC) 20 MG capsule Take 20 mg by mouth daily.   12/14/2015 at Unknown time  . oxyCODONE (OXY IR/ROXICODONE) 5 MG immediate release tablet Take 1 tablet (5 mg total) by mouth every 4 (four) hours as needed for moderate pain (dyspnea). (Patient taking differently: Take 10 mg by mouth 4 (four) times daily as needed for moderate pain (dyspnea). ) 30 tablet 0 12/14/2015 at Unknown time  . predniSONE (DELTASONE) 10 MG tablet Take 3 tablets (30 mg total) by mouth daily with breakfast. (Patient taking differently: Take 10 mg by mouth daily with breakfast. ) 15 tablet 0 12/14/2015 at Unknown time  . simvastatin (ZOCOR) 40 MG tablet Take 40 mg by mouth daily.   12/14/2015 at Unknown time  . tiotropium (SPIRIVA) 18 MCG inhalation capsule Place 18 mcg into inhaler and inhale daily.   12/14/2015 at Unknown time  . budesonide (PULMICORT) 0.5 MG/2ML nebulizer solution Take 2 mLs (0.5 mg total) by nebulization 2 (two) times daily. (Patient not taking: Reported on 12/15/2015) 2 mL 12 Not Taking at Unknown time  . famotidine (PEPCID) 20 MG tablet Take 1 tablet (20 mg total) by mouth daily. (Patient not taking: Reported on 12/15/2015) 30 tablet 0 Not Taking at Unknown time  . feeding supplement, GLUCERNA SHAKE, (GLUCERNA SHAKE)  LIQD Take 237 mLs by mouth daily with breakfast.    06/28/2015  . fentaNYL (DURAGESIC - DOSED MCG/HR) 25 MCG/HR patch Place 25 mcg onto the skin every 3 (three) days.     . food thickener (THICK IT) POWD Thicken liquids as described by nutrition (Patient not taking: Reported on 07/01/2015) 1 Can 0 Not Taking at Unknown time  . ibuprofen (ADVIL,MOTRIN) 200 MG tablet Take 800 mg by mouth every 6 (six) hours as needed (pain).   06/28/2015  . ipratropium-albuterol (DUONEB) 0.5-2.5 (3) MG/3ML SOLN Take 3 mLs by nebulization every 4 (four) hours as needed. (Patient not taking: Reported on 12/15/2015) 360 mL 0 Not Taking at Unknown time  . ipratropium-albuterol (DUONEB) 0.5-2.5 (3) MG/3ML SOLN Take 3 mLs by nebulization 4 (four) times daily. (Patient not taking: Reported on 12/15/2015) 360 mL 0 Not Taking at Unknown time  . levofloxacin (LEVAQUIN) 750 MG tablet Take 1 tablet (750 mg total) by mouth daily. (Patient not taking: Reported on 12/15/2015) 1 tablet 0 Not Taking at Unknown time  . predniSONE (DELTASONE) 20 MG tablet Take 1 tablet (20 mg total) by mouth daily with breakfast. Please start 07/12/15 after completing 30 mg doses. (Patient not taking: Reported on 12/15/2015) 30 tablet 0 Not Taking at Unknown time  . senna-docusate (SENOKOT-S) 8.6-50 MG tablet Take 1 tablet by mouth as needed for mild constipation. 30 tablet 0    Scheduled: . albuterol  2.5 mg Nebulization Q6H  . atorvastatin  20 mg Oral q1800  . chlorhexidine  15 mL Mouth Rinse BID  . Chlorhexidine Gluconate Cloth  6 each Topical Q0600  .  diltiazem  180 mg Oral Daily  . doxycycline  100 mg Oral Q12H  . enoxaparin (LOVENOX) injection  40 mg Subcutaneous Q24H  . famotidine  20 mg Oral Daily  . fentaNYL  25 mcg Transdermal Q72H  . FLUoxetine  60 mg Oral Daily  . guaiFENesin  600 mg Oral BID  . insulin aspart  0-20 Units Subcutaneous TID WC  . insulin aspart  0-5 Units Subcutaneous QHS  . insulin detemir  15 Units Subcutaneous Daily  .  lisinopril  20 mg Oral Daily  . mouth rinse  15 mL Mouth Rinse q12n4p  . methylPREDNISolone (SOLU-MEDROL) injection  60 mg Intravenous Q6H  . mometasone-formoterol  2 puff Inhalation BID  . mupirocin ointment  1 application Nasal BID  . OLANZapine  10 mg Oral Daily  . pantoprazole  40 mg Oral Daily  . sodium chloride flush  3 mL Intravenous Q12H  . tiotropium  18 mcg Inhalation Daily   Continuous:  [id number] **OR** acetaminophen, albuterol, diazepam, ibuprofen, morphine CONCENTRATE, ondansetron **OR** ondansetron (ZOFRAN) IV, oxyCODONE, senna-docusate  Assesment: She has acute on chronic hypoxic/hypercapnic respiratory failure. She's been on BiPAP and required BiPAP around the clock initially but now is on nighttime BiPAP. She had severe acute encephalopathy on admission and that has improved. This is likely related to her respiratory failure. 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 She has pulmonary hypertension at baseline. Principal Problem:   Acute on chronic respiratory failure with hypoxia and hypercapnia (HCC) Active Problems:   Back pain, chronic   Hypertension   Leukocytosis   COPD exacerbation (HCC)   Elevated troponin I level   Diabetes mellitus, type 2 (HCC)   Acute encephalopathy    Plan: No change in treatments. I have not seen her before so I'm not sure but I think she is approaching baseline. She does have BiPAP at home but I'm not sure that she's been using it regularly.    LOS: 5 days   Kamillah Didonato L 12/20/2015, 7:31 AM

## 2015-12-20 NOTE — Care Management Note (Signed)
Case Management Note  Patient Details  Name: [name] MRN: [id number] Date of Birth: May 21, 1945  Expected Discharge Date:      12/20/2015            Expected Discharge Plan:  [place]  In-House Referral:  NA  Discharge planning Services  CM Consult  Post Acute Care Choice:  Hospice Choice offered to:  Spouse HH Arranged:  RN [facility] Agency:  Other - See comment [facility])  Status of Service:  Completed, signed off   Additional Comments: Pt discharging home today with resumption of hospice services through [facility]. CM has spoke with Andee Poles and Romeo, of [facility]. They will change out pt's BIPAP at home prior to DC. They will make visit to see pt today. EMS transport will be arranged once DME has been taken care of. DC plan discussed with husband multiple times today.   Sherald Barge, RN 12/20/2015, 2:53 PM

## 2015-12-20 NOTE — Progress Notes (Addendum)
Inpatient Diabetes Program Recommendations  AACE/ADA: New Consensus Statement on Inpatient Glycemic Control (2015)  Target Ranges:  Prepandial:   less than 140 mg/dL      Peak postprandial:   less than 180 mg/dL (1-2 hours)      Critically ill patients:  140 - 180 mg/dL   Results for [name], [name] (MRN [id number]) as of 12/20/2015 09:10  Ref. Range 12/19/2015 07:30 12/19/2015 11:40 12/19/2015 16:46 12/19/2015 21:46 12/20/2015 07:29  Glucose-Capillary Latest Ref Range: 65 - 99 mg/dL 214 (H) 228 (H) 369 (H) 263 (H) 314 (H)   Review of Glycemic Control  Diabetes history:DM2 Outpatient Diabetes medications: Glipizide XL 10 mg QAM, Glipizide XL 5 mg QPM Current orders for Inpatient glycemic control: Levemir 15 units daily, Novolog 0-20 units TID with meals, Novolog 0-5 units QHS  Inpatient Diabetes Program Recommendations: Insulin - Basal: Glucose ranged from 214-369 mg/dl on 12/19/15 and fasting glucose 314 mg/dl today. If steroids are continued as ordered, please consider increasing Levemir to 25 units daily. Insulin - Meal Coverage: If steroids are continued as ordered, please consider ordering Novolog 5 units TID with meals for meal coverage.  Thanks, Barnie Alderman, RN, MSN, CDE Diabetes Coordinator Inpatient Diabetes Program [phone] (Team Pager from 8am to 5pm)

## 2015-12-20 NOTE — Progress Notes (Signed)
Patient resting in bed. Vital signs are stable. Iv access removed. Patient has no complaints of any distress. Spouse made aware patient is being discharged home now and he is ready to receive patient. Discharge instructions and prescriptions given. EMS here to transport patient back home.

## 2015-12-20 NOTE — Discharge Summary (Addendum)
[name] [id number] DOB: 10/10/1945 DOA: 12/15/2015  PCP: Glenda Chroman, MD  Admit date: 12/15/2015  Discharge date: 12/20/2015  Admitted From: Home  Hospice  Disposition:  Home Hospice   Recommendations for Outpatient Follow-up:   Follow up with PCP in 1-2 weeks  PCP Please obtain BMP/CBC, 2 view CXR in 1week,  (see Discharge instructions)   PCP Please follow up on the following pending results: None   Home Health: Home Hospice (on it) Equipment/Devices: Home Bipap ( she has it) Consultations: Pulm Discharge Condition: Fair   CODE STATUS: DNR   Diet Recommendation: Heart healthy low carbohydrate with aspiration precautions   Chief Complaint  Patient presents with  . Shortness of Breath     Brief history of present illness from the day of admission and additional interim summary     [name] is a 70 y.o. 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 Patient has chronic respiratory failure and is on palliative hospice.  She was admitted to the hospital for decreased mental status.  Hospital issues addressed     1.Acute on chronic hypoxic and hypercapnic respiratory failure due to combination of COPD exacerbation, underlying severe pulmonary hypertension and possible noncompliance with home BiPAP, Metabolic Encephalopathy.  She was home hospice and has home BiPAP, she has been treated here with IV steroids and BiPAP along with supportive care with good results, her mentation is back to baseline, she will be discharged home with continuation of home hospice on prednisone taper. Will follow with PCP and pulmonary outpatient. Have requested her to use BiPAP at nighttime and then again  in daytime if she sleeping. Goal of care is comfort with home hospice. Avoid hospital admissions if comfort is not an issue.   2. Chr.Back Pain - continue home regimen.  3. Essential hypertension. Continue Cardizem and lisinopril.  4. COPD exacerbation. Prednisone taper, oxygen and supportive care with nebulizer treatments as before. No wheezing this morning.  5. Borderline troponin. Likely due to mild demand ischemia from #1 above, this was not ACS or NSTEMI.  6. DM type II. Continue home regimen and low-carb diet. She is on home hospice.  Discharge diagnosis     Principal Problem:   Acute on chronic respiratory failure with hypoxia and hypercapnia (HCC) Active Problems:   Back pain, chronic   Hypertension   Leukocytosis   COPD exacerbation (HCC)   Elevated troponin I level   Diabetes mellitus, type 2 ([place])   Acute encephalopathy    Discharge instructions    Discharge Instructions    Discharge instructions    Complete by:  As directed    Wear BiPAP every night and in daytime if you are sleeping.   Follow with Primary MD Glenda Chroman, MD in 7 days   Get CBC, CMP, 2 view Chest X ray checked  by Primary MD or SNF MD in 5-7 days ( we routinely change or add medications that  can affect your baseline labs and fluid status, therefore we recommend that you get the mentioned basic workup next visit with your PCP, your PCP may decide not to get them or add new tests based on their clinical decision)   Activity: As tolerated with Full fall precautions use walker/cane & assistance as needed   Disposition Home     Diet:   Heart Healthy Low Carb with feeding assistance and aspiration precautions.  For Heart failure patients - Check your Weight same time everyday, if you gain over 2 pounds, or you develop in leg swelling, experience more shortness of breath or chest pain, call your Primary MD immediately. Follow Cardiac Low Salt Diet and 1.5 lit/day fluid restriction.   On your  next visit with your primary care physician please Get Medicines reviewed and adjusted.   Please request your Prim.MD to go over all Hospital Tests and Procedure/Radiological results at the follow up, please get all Hospital records sent to your Prim MD by signing hospital release before you go home.   If you experience worsening of your admission symptoms, develop shortness of breath, life threatening emergency, suicidal or homicidal thoughts you must seek medical attention immediately by calling 911 or calling your MD immediately  if symptoms less severe.  You Must read complete instructions/literature along with all the possible adverse reactions/side effects for all the Medicines you take and that have been prescribed to you. Take any new Medicines after you have completely understood and accpet all the possible adverse reactions/side effects.   Do not drive, operate heavy machinery, perform activities at heights, swimming or participation in water activities or provide baby sitting services if your were admitted for syncope or siezures until you have seen by Primary MD or a Neurologist and advised to do so again.  Do not drive when taking Pain medications.    Do not take more than prescribed Pain, Sleep and Anxiety Medications  Special Instructions: If you have smoked or chewed Tobacco  in the last 2 yrs please stop smoking, stop any regular Alcohol  and or any Recreational drug use.  Wear Seat belts while driving.   Please note  You were cared for by a hospitalist during your hospital stay. If you have any questions about your discharge medications or the care you received while you were in the hospital after you are discharged, you can call the unit and asked to speak with the hospitalist on call if the hospitalist that took care of you is not available. Once you are discharged, your primary care physician will handle any further medical issues. Please note that NO REFILLS for any  discharge medications will be authorized once you are discharged, as it is imperative that you return to your primary care physician (or establish a relationship with a primary care physician if you do not have one) for your aftercare needs so that they can reassess your need for medications and monitor your lab values.   Increase activity slowly    Complete by:  As directed       Discharge Medications     Medication List    STOP taking these medications   levofloxacin 750 MG tablet Commonly known as:  LEVAQUIN   predniSONE 10 MG tablet Commonly known as:  DELTASONE Replaced by:  predniSONE 5 MG tablet   predniSONE 20 MG tablet Commonly known as:  DELTASONE     TAKE these medications   albuterol (2.5 MG/3ML) 0.083% nebulizer solution Commonly known as:  PROVENTIL Take 2.5 mg by nebulization every 4 (four) hours. *And take three times daily as needed*   PROAIR HFA 108 (90 Base) MCG/ACT inhaler Generic drug:  albuterol Inhale 3 puffs into the lungs every 3 (three) hours as needed for wheezing or shortness of breath.   budesonide 0.5 MG/2ML nebulizer solution Commonly known as:  PULMICORT Take 2 mLs (0.5 mg total) by nebulization 2 (two) times daily.   budesonide-formoterol 160-4.5 MCG/ACT inhaler Commonly known as:  SYMBICORT Inhale 2 puffs into the lungs 2 (two) times daily.   cetirizine 10 MG tablet Commonly known as:  ZYRTEC Take 10 mg by mouth at bedtime.   diazepam 5 MG tablet Commonly known as:  VALIUM Take 1 tablet (5 mg total) by mouth every 8 (eight) hours as needed for anxiety. What changed:  when to take this   diltiazem 180 MG 24 hr capsule Commonly known as:  CARDIZEM CD Take 180 mg by mouth daily.   famotidine 20 MG tablet Commonly known as:  PEPCID Take 1 tablet (20 mg total) by mouth daily.   feeding supplement (GLUCERNA SHAKE) Liqd Take 237 mLs by mouth daily with breakfast.   fentaNYL 25 MCG/HR patch Commonly known as:  DURAGESIC - dosed  mcg/hr Place 25 mcg onto the skin every 3 (three) days.   FLUoxetine 20 MG capsule Commonly known as:  PROZAC Take 60 mg by mouth daily.   food thickener Powd Commonly known as:  THICK IT Thicken liquids as described by nutrition   glipiZIDE 5 MG 24 hr tablet Commonly known as:  GLUCOTROL XL Take 5-10 mg by mouth 2 (two) times daily. 2 tablets in the morning and 1 in the evening   ibuprofen 200 MG tablet Commonly known as:  ADVIL,MOTRIN Take 800 mg by mouth every 6 (six) hours as needed (pain).   ipratropium 17 MCG/ACT inhaler Commonly known as:  ATROVENT HFA Inhale 2 puffs into the lungs 4 (four) times daily as needed for wheezing.   ipratropium-albuterol 0.5-2.5 (3) MG/3ML Soln Commonly known as:  DUONEB Take 3 mLs by nebulization every 4 (four) hours as needed.   ipratropium-albuterol 0.5-2.5 (3) MG/3ML Soln Commonly known as:  DUONEB Take 3 mLs by nebulization 4 (four) times daily.   lisinopril 20 MG tablet Commonly known as:  PRINIVIL,ZESTRIL Take 20 mg by mouth daily.   morphine CONCENTRATE 10 mg / 0.5 ml concentrated solution Place 5 mg under the tongue every 4 (four) hours as needed for severe pain.   morphine CONCENTRATE 10 mg / 0.5 ml concentrated solution Take 5 mg by mouth every 6 (six) hours. VIA NEBULIZER   OLANZapine 10 MG tablet Commonly known as:  ZYPREXA Take 10 mg by mouth daily.   omeprazole 20 MG capsule Commonly known as:  PRILOSEC Take 20 mg by mouth daily.   oxyCODONE 5 MG immediate release tablet Commonly known as:  Oxy IR/ROXICODONE Take 1 tablet (5 mg total) by mouth every 4 (four) hours as needed for moderate pain (dyspnea). What changed:  how much to take  when to take this   predniSONE 5 MG tablet Commonly known as:  DELTASONE Label  & dispense according to the schedule below. 10 Pills PO for 3 days then, 8 Pills PO for 3 days, 6 Pills PO for 3 days, 4 Pills PO daily and discuss with your primary care physician about lowering  the dose thereafter. Replaces:  predniSONE 10 MG tablet   senna-docusate 8.6-50 MG tablet Commonly known as:  Senokot-S Take 1 tablet by mouth as needed for mild constipation.   simvastatin 40 MG tablet Commonly known as:  ZOCOR Take 40 mg by mouth daily.   tiotropium 18 MCG inhalation capsule Commonly known as:  SPIRIVA Place 18 mcg into inhaler and inhale daily.       Follow-up Information    Glenda Chroman, MD. Schedule an appointment as soon as possible for a visit in 1 week(s).   Specialty:  Internal Medicine Contact information: [place] [address] [phone]        HAWKINS,EDWARD L, MD. Schedule an appointment as soon as possible for a visit in 1 week(s).   Specialty:  Pulmonary Disease Contact information: [place] [place] [address] [phone]           Major procedures and Radiology Reports - PLEASE review detailed and final reports thoroughly  -         Dg Chest Portable 1 View  Result Date: 12/15/2015 CLINICAL DATA:  Shortness of breath. EXAM: PORTABLE CHEST 1 VIEW COMPARISON:  Radiograph of Jul 03, 2015. FINDINGS: Stable cardiomediastinal silhouette. No pneumothorax is noted. Mild central pulmonary vascular congestion is noted. Mild left basilar atelectasis or infiltrate is noted. Old right proximal humeral fracture is noted. IMPRESSION: Mild central pulmonary vascular congestion is noted with mild left basilar atelectasis or infiltrate. Electronically Signed   By: Marijo Conception, M.D.   On: 12/15/2015 15:23    Micro Results     Recent Results (from the past 240 hour(s))  MRSA PCR Screening     Status: Abnormal   Collection Time: 12/15/15  7:24 PM  Result Value Ref Range Status   MRSA by PCR POSITIVE (A) NEGATIVE Final    Comment:        The GeneXpert MRSA Assay (FDA approved for NASAL specimens only), is one component of a comprehensive MRSA colonization surveillance program. It is not intended to  diagnose MRSA infection nor to guide or monitor treatment for MRSA infections. RESULT CALLED TO, READ BACK BY AND VERIFIED WITH: HEARN,J AT 2344 ON 12/15/2015 BY EVA     Today   Subjective    Roben Frerich today has no headache,no chest abdominal pain,no new weakness tingling or numbness, feels much better wants to go home today.     Objective   Blood pressure (!) 149/74, pulse 81, temperature (!) 96.7 F (35.9 C), temperature source Oral, resp. rate (!) 22, height 5' (1.524 m), weight 80.4 kg (177 lb 4 oz), SpO2 94 %.   Intake/Output Summary (Last 24 hours) at 12/20/15 1607 Last data filed at 12/20/15 1221  Gross per 24 hour  Intake              720 ml  Output              625 ml  Net               95 ml    Exam Awake Alert, Oriented x 3, No new F.N deficits, Normal affect [place].AT,PERRAL Supple Neck,No JVD, No cervical lymphadenopathy appriciated.  Symmetrical Chest wall movement, Good air movement bilaterally, no wheezing RRR,No Gallops,Rubs or new Murmurs, No Parasternal Heave +ve B.Sounds, Abd Soft, Non tender, No organomegaly appriciated, No rebound -guarding or rigidity. No Cyanosis, Clubbing or edema, No new Rash or bruise   Data Review   CBC w Diff:  Lab Results  Component Value Date   WBC 8.9 12/20/2015  HGB 10.0 (L) 12/20/2015   HCT 33.4 (L) 12/20/2015   PLT 304 12/20/2015   LYMPHOPCT 4 12/15/2015   MONOPCT 7 12/15/2015   EOSPCT 1 12/15/2015   BASOPCT 0 12/15/2015    CMP:  Lab Results  Component Value Date   NA 137 12/20/2015   K 4.6 12/20/2015   CL 89 (L) 12/20/2015   CO2 41 (H) 12/20/2015   BUN 31 (H) 12/20/2015   CREATININE 0.80 12/20/2015   PROT 6.7 12/15/2015   ALBUMIN 3.6 12/15/2015   BILITOT 0.6 12/15/2015   ALKPHOS 54 12/15/2015   AST 14 (L) 12/15/2015   ALT 21 12/15/2015  .   Total Time in preparing paper work, data evaluation and todays exam - 35 minutes  Thurnell Lose M.D on 12/20/2015 at 4:07 PM  [facility]     Office  [phone]

## 2015-12-20 NOTE — Progress Notes (Signed)
CLARAMAE [name] [id number] DOB: 10-31-1945 DOA: 12/15/2015  PCP: Glenda Chroman, MD  Admit date: 12/15/2015  Discharge date: 12/20/2015  Admitted From: Home  Hospice  Disposition:  Home Hospice   Recommendations for Outpatient Follow-up:   Follow up with PCP in 1-2 weeks  PCP Please obtain BMP/CBC, 2 view CXR in 1week,  (see Discharge instructions)   PCP Please follow up on the following pending results: None   Home Health: Home Hospice (on it) Equipment/Devices: Home Bipap ( she has it) Consultations: Pulm Discharge Condition: Fair   CODE STATUS: DNR   Diet Recommendation: Heart healthy low carbohydrate with aspiration precautions   Chief Complaint  Patient presents with  . Shortness of Breath     Brief history of present illness from the day of admission and additional interim summary     [name] is a 70 y.o. 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 Patient has chronic respiratory failure and is on palliative hospice.  She was admitted to the hospital for decreased mental status.  Hospital issues addressed     1.Acute on chronic hypoxic and hypercapnic respiratory failure due to combination of COPD exacerbation, underlying severe pulmonary hypertension and possible noncompliance with home BiPAP.  She was home hospice and has home BiPAP, she has been treated here with IV steroids and BiPAP along with supportive care with good results, her mentation is back to baseline, she will be discharged home with continuation of home hospice on prednisone taper. Will follow with PCP and pulmonary outpatient. Have requested her to use BiPAP at nighttime and then again in daytime if she sleeping.  Goal of care is comfort with home hospice. Avoid hospital admissions if comfort is not an issue.   2. Chr.Back Pain - continue home regimen.  3. Essential hypertension. Continue Cardizem and lisinopril.  4. COPD exacerbation. Prednisone taper, oxygen and supportive care with nebulizer treatments as before. No wheezing this morning.  5. Borderline troponin. Likely due to mild demand ischemia from #1 above, this was not ACS or NSTEMI.  6. DM type II. Continue home regimen and low-carb diet. She is on home hospice.  Discharge diagnosis     Principal Problem:   Acute on chronic respiratory failure with hypoxia and hypercapnia (HCC) Active Problems:   Back pain, chronic   Hypertension   Leukocytosis   COPD exacerbation (HCC)   Elevated troponin I level   Diabetes mellitus, type 2 ([place])   Acute encephalopathy    Discharge instructions    Discharge Instructions    Discharge instructions    Complete by:  As directed    Wear BiPAP every night and in daytime if you are sleeping.   Follow with Primary MD Glenda Chroman, MD in 7 days   Get CBC, CMP, 2 view Chest X ray checked  by Primary MD or SNF MD in 5-7 days ( we routinely change or add medications that can affect  your baseline labs and fluid status, therefore we recommend that you get the mentioned basic workup next visit with your PCP, your PCP may decide not to get them or add new tests based on their clinical decision)   Activity: As tolerated with Full fall precautions use walker/cane & assistance as needed   Disposition Home     Diet:   Heart Healthy Low Carb with feeding assistance and aspiration precautions.  For Heart failure patients - Check your Weight same time everyday, if you gain over 2 pounds, or you develop in leg swelling, experience more shortness of breath or chest pain, call your Primary MD immediately. Follow Cardiac Low Salt Diet and 1.5 lit/day fluid restriction.   On your next visit with your primary  care physician please Get Medicines reviewed and adjusted.   Please request your Prim.MD to go over all Hospital Tests and Procedure/Radiological results at the follow up, please get all Hospital records sent to your Prim MD by signing hospital release before you go home.   If you experience worsening of your admission symptoms, develop shortness of breath, life threatening emergency, suicidal or homicidal thoughts you must seek medical attention immediately by calling 911 or calling your MD immediately  if symptoms less severe.  You Must read complete instructions/literature along with all the possible adverse reactions/side effects for all the Medicines you take and that have been prescribed to you. Take any new Medicines after you have completely understood and accpet all the possible adverse reactions/side effects.   Do not drive, operate heavy machinery, perform activities at heights, swimming or participation in water activities or provide baby sitting services if your were admitted for syncope or siezures until you have seen by Primary MD or a Neurologist and advised to do so again.  Do not drive when taking Pain medications.    Do not take more than prescribed Pain, Sleep and Anxiety Medications  Special Instructions: If you have smoked or chewed Tobacco  in the last 2 yrs please stop smoking, stop any regular Alcohol  and or any Recreational drug use.  Wear Seat belts while driving.   Please note  You were cared for by a hospitalist during your hospital stay. If you have any questions about your discharge medications or the care you received while you were in the hospital after you are discharged, you can call the unit and asked to speak with the hospitalist on call if the hospitalist that took care of you is not available. Once you are discharged, your primary care physician will handle any further medical issues. Please note that NO REFILLS for any discharge medications will be  authorized once you are discharged, as it is imperative that you return to your primary care physician (or establish a relationship with a primary care physician if you do not have one) for your aftercare needs so that they can reassess your need for medications and monitor your lab values.   Increase activity slowly    Complete by:  As directed       Discharge Medications     Medication List    STOP taking these medications   levofloxacin 750 MG tablet Commonly known as:  LEVAQUIN   predniSONE 10 MG tablet Commonly known as:  DELTASONE Replaced by:  predniSONE 5 MG tablet   predniSONE 20 MG tablet Commonly known as:  DELTASONE     TAKE these medications   albuterol (2.5 MG/3ML) 0.083% nebulizer solution Commonly known as:  PROVENTIL  Take 2.5 mg by nebulization every 4 (four) hours. *And take three times daily as needed*   PROAIR HFA 108 (90 Base) MCG/ACT inhaler Generic drug:  albuterol Inhale 3 puffs into the lungs every 3 (three) hours as needed for wheezing or shortness of breath.   budesonide 0.5 MG/2ML nebulizer solution Commonly known as:  PULMICORT Take 2 mLs (0.5 mg total) by nebulization 2 (two) times daily.   budesonide-formoterol 160-4.5 MCG/ACT inhaler Commonly known as:  SYMBICORT Inhale 2 puffs into the lungs 2 (two) times daily.   cetirizine 10 MG tablet Commonly known as:  ZYRTEC Take 10 mg by mouth at bedtime.   diazepam 5 MG tablet Commonly known as:  VALIUM Take 1 tablet (5 mg total) by mouth every 8 (eight) hours as needed for anxiety. What changed:  when to take this   diltiazem 180 MG 24 hr capsule Commonly known as:  CARDIZEM CD Take 180 mg by mouth daily.   famotidine 20 MG tablet Commonly known as:  PEPCID Take 1 tablet (20 mg total) by mouth daily.   feeding supplement (GLUCERNA SHAKE) Liqd Take 237 mLs by mouth daily with breakfast.   fentaNYL 25 MCG/HR patch Commonly known as:  DURAGESIC - dosed mcg/hr Place 25 mcg onto the  skin every 3 (three) days.   FLUoxetine 20 MG capsule Commonly known as:  PROZAC Take 60 mg by mouth daily.   food thickener Powd Commonly known as:  THICK IT Thicken liquids as described by nutrition   glipiZIDE 5 MG 24 hr tablet Commonly known as:  GLUCOTROL XL Take 5-10 mg by mouth 2 (two) times daily. 2 tablets in the morning and 1 in the evening   ibuprofen 200 MG tablet Commonly known as:  ADVIL,MOTRIN Take 800 mg by mouth every 6 (six) hours as needed (pain).   ipratropium 17 MCG/ACT inhaler Commonly known as:  ATROVENT HFA Inhale 2 puffs into the lungs 4 (four) times daily as needed for wheezing.   ipratropium-albuterol 0.5-2.5 (3) MG/3ML Soln Commonly known as:  DUONEB Take 3 mLs by nebulization every 4 (four) hours as needed.   ipratropium-albuterol 0.5-2.5 (3) MG/3ML Soln Commonly known as:  DUONEB Take 3 mLs by nebulization 4 (four) times daily.   lisinopril 20 MG tablet Commonly known as:  PRINIVIL,ZESTRIL Take 20 mg by mouth daily.   morphine CONCENTRATE 10 mg / 0.5 ml concentrated solution Place 5 mg under the tongue every 4 (four) hours as needed for severe pain.   morphine CONCENTRATE 10 mg / 0.5 ml concentrated solution Take 5 mg by mouth every 6 (six) hours. VIA NEBULIZER   OLANZapine 10 MG tablet Commonly known as:  ZYPREXA Take 10 mg by mouth daily.   omeprazole 20 MG capsule Commonly known as:  PRILOSEC Take 20 mg by mouth daily.   oxyCODONE 5 MG immediate release tablet Commonly known as:  Oxy IR/ROXICODONE Take 1 tablet (5 mg total) by mouth every 4 (four) hours as needed for moderate pain (dyspnea). What changed:  how much to take  when to take this   predniSONE 5 MG tablet Commonly known as:  DELTASONE Label  & dispense according to the schedule below. 10 Pills PO for 3 days then, 8 Pills PO for 3 days, 6 Pills PO for 3 days, 4 Pills PO daily and discuss with your primary care physician about lowering the dose thereafter. Replaces:   predniSONE 10 MG tablet   senna-docusate 8.6-50 MG tablet Commonly known as:  Senokot-S  Take 1 tablet by mouth as needed for mild constipation.   simvastatin 40 MG tablet Commonly known as:  ZOCOR Take 40 mg by mouth daily.   tiotropium 18 MCG inhalation capsule Commonly known as:  SPIRIVA Place 18 mcg into inhaler and inhale daily.       Follow-up Information    Glenda Chroman, MD. Schedule an appointment as soon as possible for a visit in 1 week(s).   Specialty:  Internal Medicine Contact information: [place] [address] [phone]        HAWKINS,EDWARD L, MD. Schedule an appointment as soon as possible for a visit in 1 week(s).   Specialty:  Pulmonary Disease Contact information: [place] [address] [phone]           Major procedures and Radiology Reports - PLEASE review detailed and final reports thoroughly  -         Dg Chest Portable 1 View  Result Date: 12/15/2015 CLINICAL DATA:  Shortness of breath. EXAM: PORTABLE CHEST 1 VIEW COMPARISON:  Radiograph of Jul 03, 2015. FINDINGS: Stable cardiomediastinal silhouette. No pneumothorax is noted. Mild central pulmonary vascular congestion is noted. Mild left basilar atelectasis or infiltrate is noted. Old right proximal humeral fracture is noted. IMPRESSION: Mild central pulmonary vascular congestion is noted with mild left basilar atelectasis or infiltrate. Electronically Signed   By: Marijo Conception, M.D.   On: 12/15/2015 15:23    Micro Results     Recent Results (from the past 240 hour(s))  MRSA PCR Screening     Status: Abnormal   Collection Time: 12/15/15  7:24 PM  Result Value Ref Range Status   MRSA by PCR POSITIVE (A) NEGATIVE Final    Comment:        The GeneXpert MRSA Assay (FDA approved for NASAL specimens only), is one component of a comprehensive MRSA colonization surveillance program. It is not intended to diagnose MRSA infection nor to  guide or monitor treatment for MRSA infections. RESULT CALLED TO, READ BACK BY AND VERIFIED WITH: HEARN,J AT 2344 ON 12/15/2015 BY EVA     Today   Subjective    Chazlyn Olmedo today has no headache,no chest abdominal pain,no new weakness tingling or numbness, feels much better wants to go home today.     Objective   Blood pressure (!) 147/97, pulse 81, temperature 97 F (36.1 C), temperature source Axillary, resp. rate (!) 23, height 5' (1.524 m), weight 80.4 kg (177 lb 4 oz), SpO2 97 %.   Intake/Output Summary (Last 24 hours) at 12/20/15 0948 Last data filed at 12/20/15 0845  Gross per 24 hour  Intake              480 ml  Output              525 ml  Net              -45 ml    Exam Awake Alert, Oriented x 3, No new F.N deficits, Normal affect [place].AT,PERRAL Supple Neck,No JVD, No cervical lymphadenopathy appriciated.  Symmetrical Chest wall movement, Good air movement bilaterally, no wheezing RRR,No Gallops,Rubs or new Murmurs, No Parasternal Heave +ve B.Sounds, Abd Soft, Non tender, No organomegaly appriciated, No rebound -guarding or rigidity. No Cyanosis, Clubbing or edema, No new Rash or bruise   Data Review   CBC w Diff:  Lab Results  Component Value Date   WBC 8.9 12/20/2015   HGB  10.0 (L) 12/20/2015   HCT 33.4 (L) 12/20/2015   PLT 304 12/20/2015   LYMPHOPCT 4 12/15/2015   MONOPCT 7 12/15/2015   EOSPCT 1 12/15/2015   BASOPCT 0 12/15/2015    CMP:  Lab Results  Component Value Date   NA 137 12/20/2015   K 4.6 12/20/2015   CL 89 (L) 12/20/2015   CO2 41 (H) 12/20/2015   BUN 31 (H) 12/20/2015   CREATININE 0.80 12/20/2015   PROT 6.7 12/15/2015   ALBUMIN 3.6 12/15/2015   BILITOT 0.6 12/15/2015   ALKPHOS 54 12/15/2015   AST 14 (L) 12/15/2015   ALT 21 12/15/2015  .   Total Time in preparing paper work, data evaluation and todays exam - 35 minutes  Thurnell Lose M.D on 12/20/2015 at 9:48 AM  [facility]   Office  [phone]

## 2015-12-20 NOTE — Progress Notes (Signed)
**Note De-Identified  Obfuscation** Patient removed from BIPAP and placed on 6L HFNC; tolerating well.  RRT to continue to monitor.

## 2015-12-20 NOTE — Discharge Instructions (Signed)
Wear BiPAP every night and in daytime if you are sleeping.   Follow with Primary MD Glenda Chroman, MD in 7 days   Get CBC, CMP, 2 view Chest X ray checked  by Primary MD or SNF MD in 5-7 days ( we routinely change or add medications that can affect your baseline labs and fluid status, therefore we recommend that you get the mentioned basic workup next visit with your PCP, your PCP may decide not to get them or add new tests based on their clinical decision)   Activity: As tolerated with Full fall precautions use walker/cane & assistance as needed   Disposition Home     Diet:   Heart Healthy Low Carb with feeding assistance and aspiration precautions.  For Heart failure patients - Check your Weight same time everyday, if you gain over 2 pounds, or you develop in leg swelling, experience more shortness of breath or chest pain, call your Primary MD immediately. Follow Cardiac Low Salt Diet and 1.5 lit/day fluid restriction.   On your next visit with your primary care physician please Get Medicines reviewed and adjusted.   Please request your Prim.MD to go over all Hospital Tests and Procedure/Radiological results at the follow up, please get all Hospital records sent to your Prim MD by signing hospital release before you go home.   If you experience worsening of your admission symptoms, develop shortness of breath, life threatening emergency, suicidal or homicidal thoughts you must seek medical attention immediately by calling 911 or calling your MD immediately  if symptoms less severe.  You Must read complete instructions/literature along with all the possible adverse reactions/side effects for all the Medicines you take and that have been prescribed to you. Take any new Medicines after you have completely understood and accpet all the possible adverse reactions/side effects.   Do not drive, operate heavy machinery, perform activities at heights, swimming or participation in water activities  or provide baby sitting services if your were admitted for syncope or siezures until you have seen by Primary MD or a Neurologist and advised to do so again.  Do not drive when taking Pain medications.    Do not take more than prescribed Pain, Sleep and Anxiety Medications  Special Instructions: If you have smoked or chewed Tobacco  in the last 2 yrs please stop smoking, stop any regular Alcohol  and or any Recreational drug use.  Wear Seat belts while driving.   Please note  You were cared for by a hospitalist during your hospital stay. If you have any questions about your discharge medications or the care you received while you were in the hospital after you are discharged, you can call the unit and asked to speak with the hospitalist on call if the hospitalist that took care of you is not available. Once you are discharged, your primary care physician will handle any further medical issues. Please note that NO REFILLS for any discharge medications will be authorized once you are discharged, as it is imperative that you return to your primary care physician (or establish a relationship with a primary care physician if you do not have one) for your aftercare needs so that they can reassess your need for medications and monitor your lab values.

## 2016-02-05 DEATH — deceased

## 2016-05-07 LAB — BLOOD GAS, ARTERIAL
Bicarbonate: 34.6 mmol/L — ABNORMAL HIGH (ref 20.0–28.0)
DRAWN BY: 22223
Delivery systems: POSITIVE
Expiratory PAP: 8
FIO2: 40
INSPIRATORY PAP: 16
LHR: 10 {breaths}/min
O2 SAT: 94.4 %
PO2 ART: 77 mmHg — AB (ref 83.0–108.0)
pCO2 arterial: 76.2 mmHg (ref 32.0–48.0)
pH, Arterial: 7.332 — ABNORMAL LOW (ref 7.350–7.450)

## 2017-11-20 IMAGING — CT CT CHEST W/O CM
2 of 4 series · 15 of 36 positions shown, 18 images · non-contrast
Comparison: November 02, 2008

CLINICAL DATA: Pneumonia.

EXAM:
CT CHEST WITHOUT CONTRAST
TECHNIQUE: Multidetector CT imaging of the chest was performed following the
standard protocol without IV contrast.

[Series 4: chest w/o 1mm st · axial · non-contrast · 0.71mm/px · z∈[-107,+162]mm · 12 of 376 slices shown, 15 images]
[im 20/376  mediastinal]
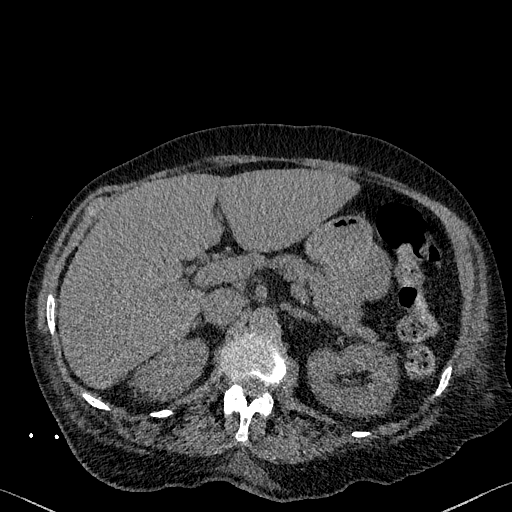
[im 20/376  lung]
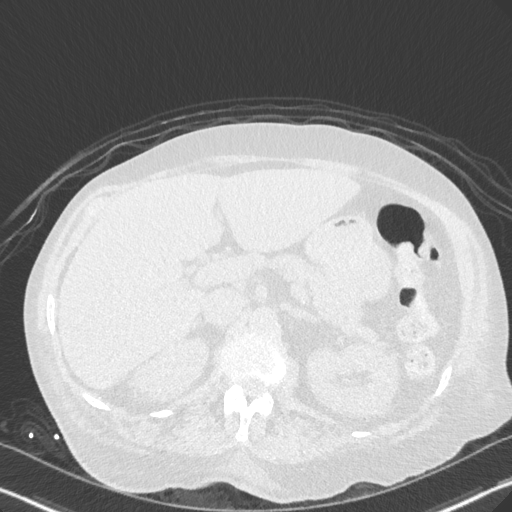
[im 60/376  lung]
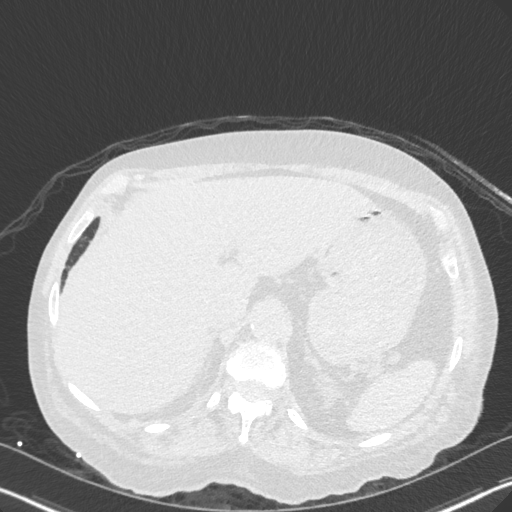
[im 79/376  lung]
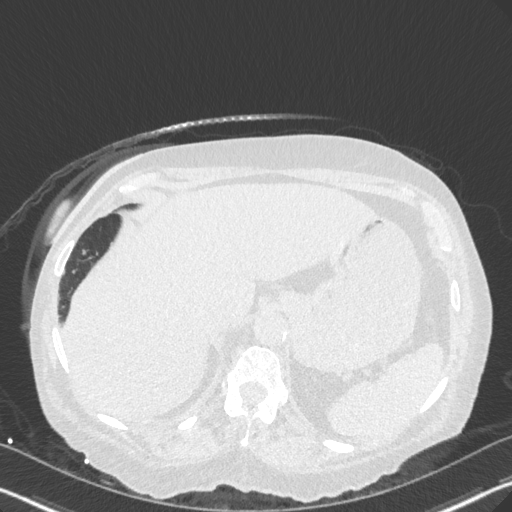
[im 119/376  lung]
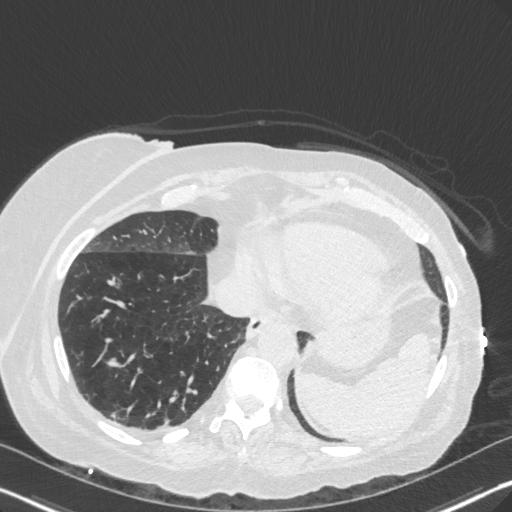
[im 139/376  mediastinal]
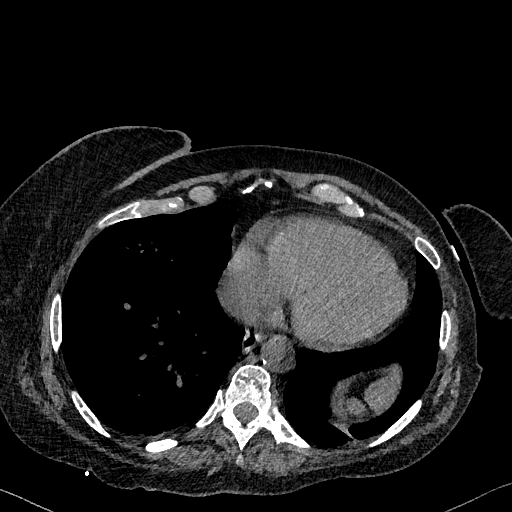
[im 139/376  lung]
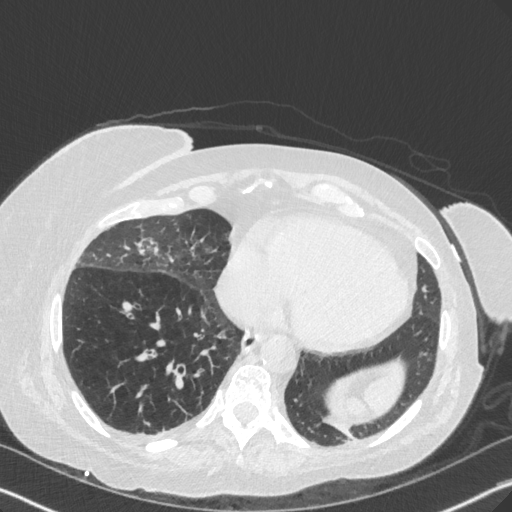
[im 178/376  lung]
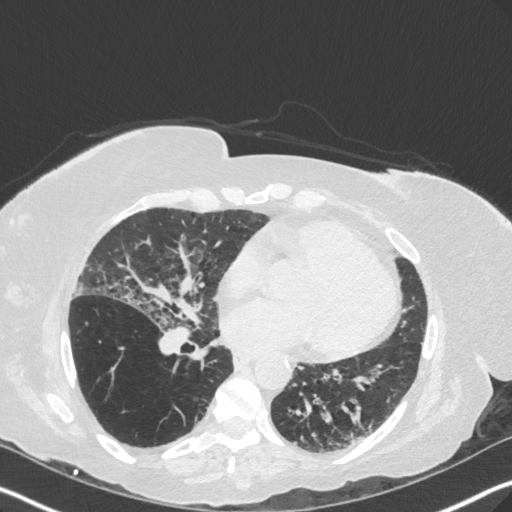
[im 198/376  lung]
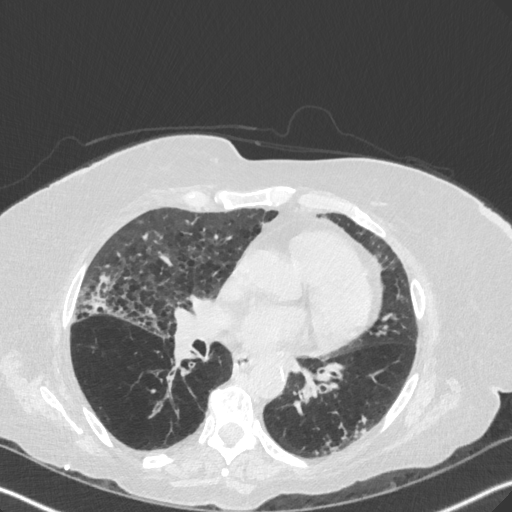
[im 237/376  lung]
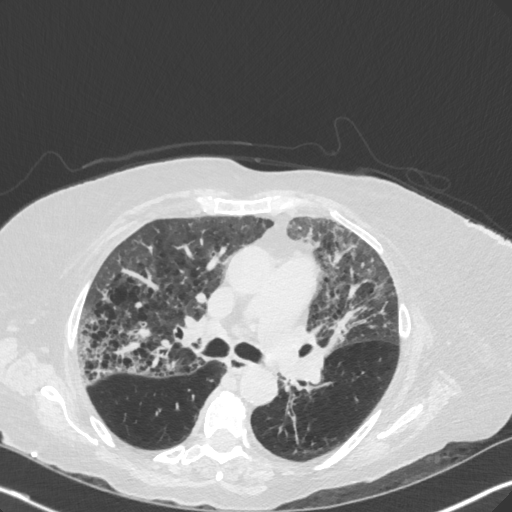
[im 257/376  mediastinal]
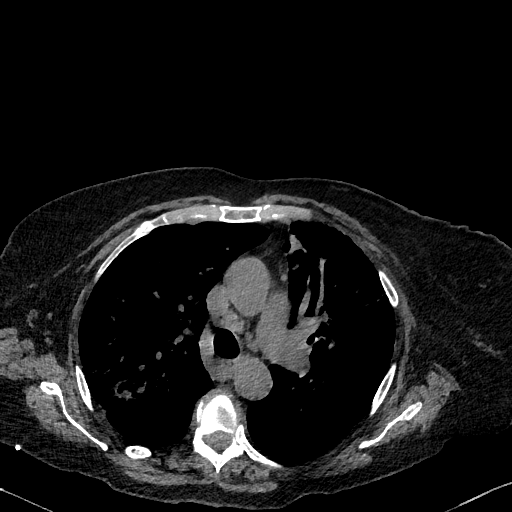
[im 257/376  lung]
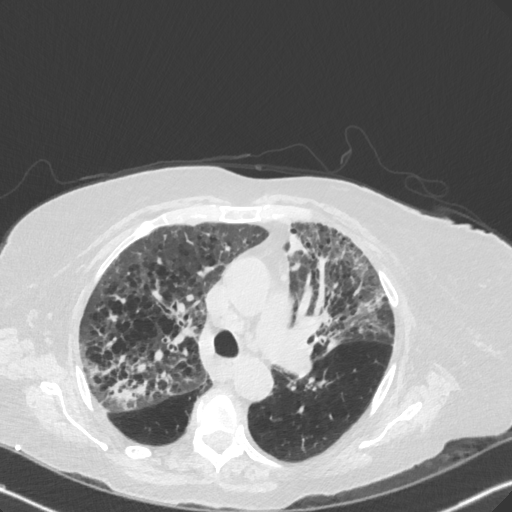
[im 297/376  lung]
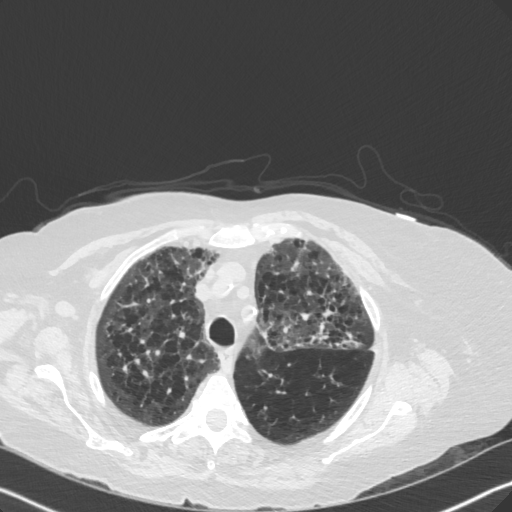
[im 316/376  lung]
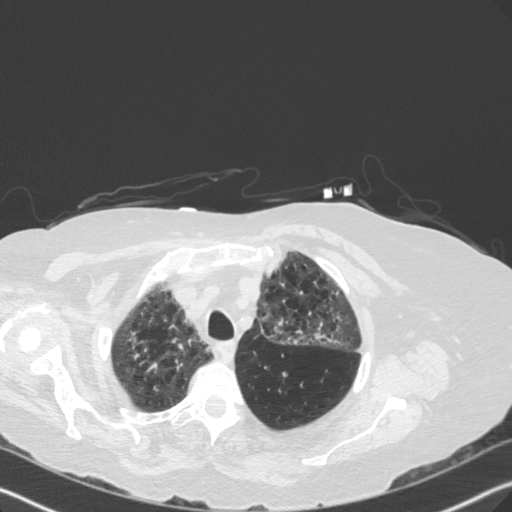
[im 356/376  lung]
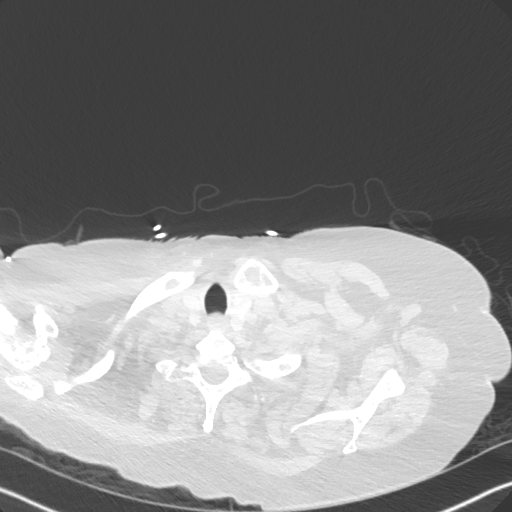

[Series 5: chest w/o 3mm st cor · coronal · non-contrast · 0.59mm/px · 3 of 88 slices shown]
[im 18/88  lung]
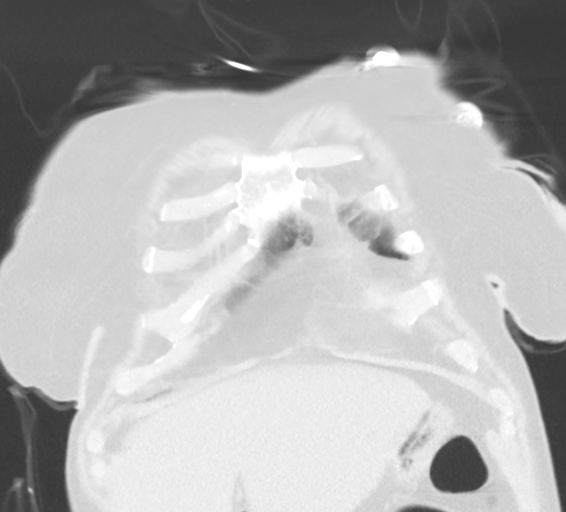
[im 35/88  lung]
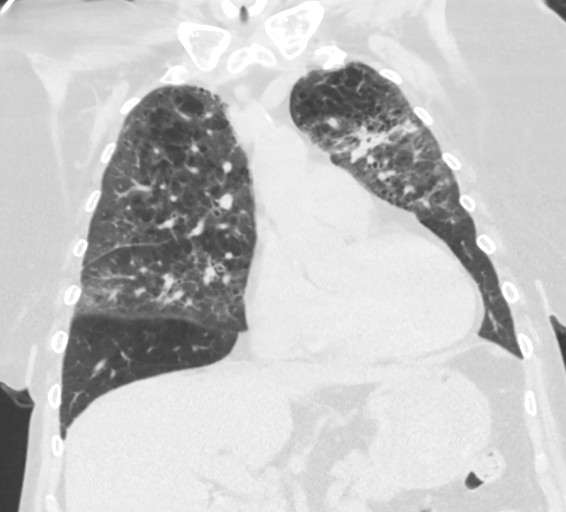
[im 53/88  lung]
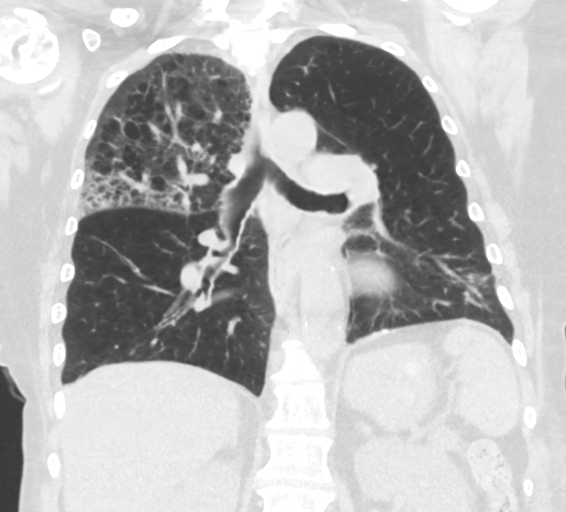

[15 of 36 positions shown; findings below may reference images not displayed]

FINDINGS: The trachea and right-sided airways are normal. There is mild
thickening of the left central and lower lobe bronchi without any
discrete masses or intraluminal filling defects. Emphysematous
changes are seen in the lungs. Ground-glass in the upper lobes on
the previous study persists. However, there is now focal infiltrate
in both upper lobes as well, more marked on the left than the right.
Mild patchy opacity is seen in the left base also. The right lower
lobe is clear. There is volume loss in the left upper lobe, likely
due to the left-sided opacity. No discrete masses are seen.
Evaluation for pulmonary nodules is limited due to the bilateral
pulmonary opacity. There is a mildly enlarged precarinal node
measuring 16 mm in short axis, nonspecific but possibly reactive. A
few other shotty nodes are seen as well, all larger in the interval.
A subcarinal node measures 15 mm. The central pulmonary arteries are
normal.

Coronary artery calcifications. The heart is unchanged. No
effusions.

Evaluation of the upper abdomen is limited but unremarkable. There
is significant loss of height of of the T5 vertebral body, new in
the interval. Air in the collapsed vertebral body suggests this may
be chronic. No other bony abnormalities.
IMPRESSION: 1. Bilateral upper lobe infiltrates, left greater than right, with
mild patchy opacity in the left base as well. The findings are
consistent with a multi focal pneumonia or aspiration. Recommend
clinical correlation and follow-up to resolution.
2. There is volume loss in the left upper lobe. There is mild
thickening of the central airways which could be inflammatory. No
discrete mass is seen. However, no contrast was given on today's
study. Recommend attention on follow-up.
3. Prominent and mildly enlarged lymph nodes are nonspecific but
could be reactive. Recommend attention on follow-up.
4. Significant loss of height of T5, age indeterminate but possibly
chronic. Recommend clinical correlation.
